# Patient Record
Sex: Female | Born: 1990 | Race: White | Hispanic: No | Marital: Single | Smoking: Current every day smoker
Health system: Southern US, Community
[De-identification: ages and names within clinical notes are randomized; demographics above are authoritative.]

## PROBLEM LIST (undated history)

## (undated) DIAGNOSIS — F1910 Other psychoactive substance abuse, uncomplicated: Secondary | ICD-10-CM

## (undated) DIAGNOSIS — M75100 Unspecified rotator cuff tear or rupture of unspecified shoulder, not specified as traumatic: Secondary | ICD-10-CM

## (undated) DIAGNOSIS — N926 Irregular menstruation, unspecified: Secondary | ICD-10-CM

## (undated) DIAGNOSIS — B029 Zoster without complications: Secondary | ICD-10-CM

## (undated) DIAGNOSIS — F419 Anxiety disorder, unspecified: Secondary | ICD-10-CM

## (undated) HISTORY — PX: TONSILLECTOMY: SUR1361

## (undated) HISTORY — PX: POLYPECTOMY: SHX149

## (undated) HISTORY — PX: WISDOM TOOTH EXTRACTION: SHX21

## (undated) HISTORY — PX: APPENDECTOMY: SHX54

---
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
## 2011-03-09 ENCOUNTER — Emergency Department: Payer: Self-pay

## 2011-03-09 LAB — COMPREHENSIVE METABOLIC PANEL
Albumin: 4.4 g/dL (ref 3.8–5.6)
Alkaline Phosphatase: 74 U/L — ABNORMAL LOW (ref 82–169)
Anion Gap: 8 (ref 7–16)
BUN: 9 mg/dL (ref 7–18)
Bilirubin,Total: 0.3 mg/dL (ref 0.2–1.0)
Calcium, Total: 8.5 mg/dL — ABNORMAL LOW (ref 9.0–10.7)
Chloride: 106 mmol/L (ref 98–107)
Co2: 26 mmol/L (ref 21–32)
Creatinine: 0.61 mg/dL (ref 0.60–1.30)
EGFR (African American): >60
EGFR (Non-African Amer.): >60
Osmolality: 278 (ref 275–301)
SGOT(AST): 23 U/L (ref 0–26)
Total Protein: 8.2 g/dL (ref 6.4–8.6)

## 2011-03-09 LAB — CBC
HGB: 14.8 g/dL (ref 12.0–16.0)
MCH: 32.3 pg (ref 26.0–34.0)
MCHC: 33.8 g/dL (ref 32.0–36.0)
Platelet: 180 10*3/uL (ref 150–440)
RBC: 4.56 10*6/uL (ref 3.80–5.20)
RDW: 13.4 % (ref 11.5–14.5)

## 2011-03-09 LAB — DRUG SCREEN, URINE
Barbiturates, Ur Screen: NEGATIVE (ref ?–200)
Methadone, Ur Screen: NEGATIVE (ref ?–300)
Opiate, Ur Screen: NEGATIVE (ref ?–300)
Phencyclidine (PCP) Ur S: NEGATIVE (ref ?–25)
Tricyclic, Ur Screen: NEGATIVE (ref ?–1000)

## 2011-03-09 LAB — ETHANOL
Ethanol %: 0.145 % — ABNORMAL HIGH (ref 0.000–0.080)
Ethanol: 145 mg/dL

## 2011-03-09 LAB — ACETAMINOPHEN LEVEL: Acetaminophen: <2 ug/mL

## 2011-03-09 LAB — TSH: Thyroid Stimulating Horm: 1.79 u[IU]/mL

## 2011-03-10 LAB — URINALYSIS, COMPLETE
Bacteria: NONE SEEN
Blood: NEGATIVE
Glucose,UR: NEGATIVE mg/dL (ref 0–75)
Ketone: NEGATIVE
Nitrite: NEGATIVE
Ph: 5 (ref 4.5–8.0)
Protein: NEGATIVE
RBC,UR: NONE SEEN /HPF (ref 0–5)
Specific Gravity: 1.004 (ref 1.003–1.030)
WBC UR: <1 /HPF (ref 0–5)

## 2011-03-10 LAB — PREGNANCY, URINE: Pregnancy Test, Urine: NEGATIVE m[IU]/mL

## 2011-07-14 ENCOUNTER — Emergency Department: Payer: Self-pay | Admitting: Emergency Medicine

## 2011-11-01 LAB — COMPREHENSIVE METABOLIC PANEL
Albumin: 4.5 g/dL (ref 3.4–5.0)
Alkaline Phosphatase: 76 U/L (ref 50–136)
Anion Gap: 9 (ref 7–16)
BUN: 12 mg/dL (ref 7–18)
Calcium, Total: 8.7 mg/dL (ref 8.5–10.1)
EGFR (African American): >60
Glucose: 86 mg/dL (ref 65–99)
Potassium: 3.8 mmol/L (ref 3.5–5.1)
SGOT(AST): 15 U/L (ref 15–37)
Sodium: 145 mmol/L (ref 136–145)
Total Protein: 8.4 g/dL — ABNORMAL HIGH (ref 6.4–8.2)

## 2011-11-01 LAB — URINALYSIS, COMPLETE
Blood: NEGATIVE
Glucose,UR: NEGATIVE mg/dL (ref 0–75)
Ketone: NEGATIVE
Leukocyte Esterase: NEGATIVE
Nitrite: NEGATIVE
Ph: 6 (ref 4.5–8.0)
Protein: NEGATIVE
RBC,UR: <1 /HPF (ref 0–5)
Specific Gravity: 1.004 (ref 1.003–1.030)

## 2011-11-01 LAB — DRUG SCREEN, URINE
Barbiturates, Ur Screen: NEGATIVE (ref ?–200)
Cannabinoid 50 Ng, Ur ~~LOC~~: POSITIVE (ref ?–50)
Cocaine Metabolite,Ur ~~LOC~~: NEGATIVE (ref ?–300)
Opiate, Ur Screen: NEGATIVE (ref ?–300)
Phencyclidine (PCP) Ur S: NEGATIVE (ref ?–25)
Tricyclic, Ur Screen: NEGATIVE (ref ?–1000)

## 2011-11-01 LAB — CBC
HGB: 14.3 g/dL (ref 12.0–16.0)
MCHC: 34.1 g/dL (ref 32.0–36.0)
RBC: 4.43 10*6/uL (ref 3.80–5.20)
RDW: 12.8 % (ref 11.5–14.5)
WBC: 7.6 10*3/uL (ref 3.6–11.0)

## 2011-11-01 LAB — PREGNANCY, URINE: Pregnancy Test, Urine: NEGATIVE m[IU]/mL

## 2011-11-01 LAB — ACETAMINOPHEN LEVEL: Acetaminophen: <2 ug/mL

## 2011-11-01 LAB — ETHANOL: Ethanol: 67 mg/dL

## 2011-11-01 LAB — TSH: Thyroid Stimulating Horm: 1.3 u[IU]/mL

## 2011-11-02 ENCOUNTER — Inpatient Hospital Stay: Payer: Self-pay | Admitting: Psychiatry

## 2011-11-02 LAB — TROPONIN I: Troponin-I: <0.02 ng/mL

## 2012-04-09 ENCOUNTER — Emergency Department: Payer: Self-pay | Admitting: Emergency Medicine

## 2012-08-30 ENCOUNTER — Emergency Department: Payer: Self-pay

## 2012-08-30 LAB — URINALYSIS, COMPLETE
Bacteria: NONE SEEN
Glucose,UR: NEGATIVE mg/dL (ref 0–75)
Ketone: NEGATIVE
Leukocyte Esterase: NEGATIVE
Nitrite: NEGATIVE
Ph: 5 (ref 4.5–8.0)
Protein: NEGATIVE
RBC,UR: <1 /HPF (ref 0–5)
Squamous Epithelial: 1
WBC UR: 1 /HPF (ref 0–5)

## 2012-08-30 LAB — WET PREP, GENITAL

## 2012-08-30 LAB — CBC
HGB: 12.8 g/dL (ref 12.0–16.0)
MCH: 30.9 pg (ref 26.0–34.0)
MCHC: 34.2 g/dL (ref 32.0–36.0)
MCV: 90 fL (ref 80–100)
RBC: 4.13 10*6/uL (ref 3.80–5.20)
RDW: 13.5 % (ref 11.5–14.5)

## 2012-08-30 LAB — GC/CHLAMYDIA PROBE AMP

## 2013-02-16 ENCOUNTER — Emergency Department: Payer: Self-pay | Admitting: Emergency Medicine

## 2013-04-07 ENCOUNTER — Emergency Department: Payer: Self-pay | Admitting: Emergency Medicine

## 2013-07-14 ENCOUNTER — Emergency Department: Payer: Self-pay | Admitting: Emergency Medicine

## 2013-07-14 LAB — CBC
HCT: 41.8 % (ref 35.0–47.0)
HGB: 13.8 g/dL (ref 12.0–16.0)
MCH: 30 pg (ref 26.0–34.0)
MCHC: 33.1 g/dL (ref 32.0–36.0)
MCV: 91 fL (ref 80–100)
PLATELETS: 234 10*3/uL (ref 150–440)
RBC: 4.6 10*6/uL (ref 3.80–5.20)
RDW: 15.5 % — ABNORMAL HIGH (ref 11.5–14.5)
WBC: 10.9 10*3/uL (ref 3.6–11.0)

## 2013-07-14 LAB — GC/CHLAMYDIA PROBE AMP

## 2013-07-14 LAB — WET PREP, GENITAL

## 2013-07-14 LAB — TSH: THYROID STIMULATING HORM: 1.36 u[IU]/mL

## 2013-10-14 ENCOUNTER — Emergency Department: Payer: Self-pay | Admitting: Internal Medicine

## 2014-01-30 ENCOUNTER — Encounter (HOSPITAL_COMMUNITY): Payer: Self-pay | Admitting: Emergency Medicine

## 2014-01-30 ENCOUNTER — Emergency Department (HOSPITAL_COMMUNITY)
Admission: EM | Admit: 2014-01-30 | Discharge: 2014-01-30 | Disposition: A | Discharge status: Other Acute Inpatient Hospital | Payer: Self-pay | Attending: Emergency Medicine | Admitting: Emergency Medicine

## 2014-01-30 ENCOUNTER — Inpatient Hospital Stay (HOSPITAL_COMMUNITY)
Admission: EM | Admit: 2014-01-30 | Discharge: 2014-02-05 | DRG: 881 | Disposition: A | Discharge status: Rehabilitation Facility | Payer: Federal, State, Local not specified - Other | Source: Intra-hospital | Attending: Psychiatry | Admitting: Psychiatry

## 2014-01-30 DIAGNOSIS — F1020 Alcohol dependence, uncomplicated: Secondary | ICD-10-CM | POA: Diagnosis present

## 2014-01-30 DIAGNOSIS — F329 Major depressive disorder, single episode, unspecified: Secondary | ICD-10-CM | POA: Diagnosis present

## 2014-01-30 DIAGNOSIS — G4700 Insomnia, unspecified: Secondary | ICD-10-CM | POA: Diagnosis present

## 2014-01-30 DIAGNOSIS — F17210 Nicotine dependence, cigarettes, uncomplicated: Secondary | ICD-10-CM | POA: Diagnosis present

## 2014-01-30 DIAGNOSIS — F1010 Alcohol abuse, uncomplicated: Secondary | ICD-10-CM | POA: Insufficient documentation

## 2014-01-30 DIAGNOSIS — Z23 Encounter for immunization: Secondary | ICD-10-CM | POA: Diagnosis not present

## 2014-01-30 DIAGNOSIS — F1120 Opioid dependence, uncomplicated: Secondary | ICD-10-CM | POA: Diagnosis present

## 2014-01-30 DIAGNOSIS — Z811 Family history of alcohol abuse and dependence: Secondary | ICD-10-CM | POA: Diagnosis not present

## 2014-01-30 DIAGNOSIS — F419 Anxiety disorder, unspecified: Secondary | ICD-10-CM | POA: Diagnosis present

## 2014-01-30 DIAGNOSIS — F1910 Other psychoactive substance abuse, uncomplicated: Secondary | ICD-10-CM

## 2014-01-30 DIAGNOSIS — F1410 Cocaine abuse, uncomplicated: Secondary | ICD-10-CM | POA: Diagnosis present

## 2014-01-30 DIAGNOSIS — F1210 Cannabis abuse, uncomplicated: Secondary | ICD-10-CM | POA: Insufficient documentation

## 2014-01-30 DIAGNOSIS — Z3202 Encounter for pregnancy test, result negative: Secondary | ICD-10-CM | POA: Insufficient documentation

## 2014-01-30 DIAGNOSIS — Z720 Tobacco use: Secondary | ICD-10-CM | POA: Insufficient documentation

## 2014-01-30 DIAGNOSIS — Z8739 Personal history of other diseases of the musculoskeletal system and connective tissue: Secondary | ICD-10-CM | POA: Insufficient documentation

## 2014-01-30 HISTORY — DX: Unspecified rotator cuff tear or rupture of unspecified shoulder, not specified as traumatic: M75.100

## 2014-01-30 HISTORY — DX: Zoster without complications: B02.9

## 2014-01-30 LAB — CBC WITH DIFFERENTIAL/PLATELET
BASOS ABS: 0 10*3/uL (ref 0.0–0.1)
BASOS PCT: 0 % (ref 0–1)
EOS ABS: 0.1 10*3/uL (ref 0.0–0.7)
EOS PCT: 1 % (ref 0–5)
HCT: 42.7 % (ref 36.0–46.0)
Hemoglobin: 13.4 g/dL (ref 12.0–15.0)
LYMPHS ABS: 1.8 10*3/uL (ref 0.7–4.0)
LYMPHS PCT: 23 % (ref 12–46)
MCH: 29.9 pg (ref 26.0–34.0)
MCHC: 31.4 g/dL (ref 30.0–36.0)
MCV: 95.3 fL (ref 78.0–100.0)
MONO ABS: 0.7 10*3/uL (ref 0.1–1.0)
MONOS PCT: 9 % (ref 3–12)
NEUTROS ABS: 5.1 10*3/uL (ref 1.7–7.7)
NEUTROS PCT: 67 % (ref 43–77)
Platelets: 219 10*3/uL (ref 150–400)
RBC: 4.48 MIL/uL (ref 3.87–5.11)
RDW: 15.9 % — AB (ref 11.5–15.5)
WBC: 7.8 10*3/uL (ref 4.0–10.5)

## 2014-01-30 LAB — RAPID URINE DRUG SCREEN, HOSP PERFORMED
Amphetamines: NOT DETECTED
Barbiturates: NOT DETECTED
Benzodiazepines: NOT DETECTED
COCAINE: POSITIVE — AB
Opiates: NOT DETECTED
TETRAHYDROCANNABINOL: POSITIVE — AB

## 2014-01-30 LAB — COMPREHENSIVE METABOLIC PANEL
ALBUMIN: 4 g/dL (ref 3.5–5.2)
ALT: 21 U/L (ref 0–35)
AST: 19 U/L (ref 0–37)
Alkaline Phosphatase: 56 U/L (ref 39–117)
Anion gap: 11 (ref 5–15)
BUN: 10 mg/dL (ref 6–23)
CO2: 24 mmol/L (ref 19–32)
Calcium: 9.5 mg/dL (ref 8.4–10.5)
Chloride: 104 mEq/L (ref 96–112)
Creatinine, Ser: 0.61 mg/dL (ref 0.50–1.10)
GFR calc Af Amer: >90 mL/min (ref >90)
GLUCOSE: 104 mg/dL — AB (ref 70–99)
Potassium: 3.7 mmol/L (ref 3.5–5.1)
Sodium: 139 mmol/L (ref 135–145)
Total Bilirubin: 0.7 mg/dL (ref 0.3–1.2)
Total Protein: 7.2 g/dL (ref 6.0–8.3)

## 2014-01-30 LAB — URINE MICROSCOPIC-ADD ON

## 2014-01-30 LAB — URINALYSIS, ROUTINE W REFLEX MICROSCOPIC
Bilirubin Urine: NEGATIVE
Glucose, UA: NEGATIVE mg/dL
HGB URINE DIPSTICK: NEGATIVE
Ketones, ur: NEGATIVE mg/dL
NITRITE: NEGATIVE
PROTEIN: NEGATIVE mg/dL
Specific Gravity, Urine: 1.021 (ref 1.005–1.030)
Urobilinogen, UA: 1 mg/dL (ref 0.0–1.0)
pH: 5.5 (ref 5.0–8.0)

## 2014-01-30 LAB — ETHANOL: Alcohol, Ethyl (B): <5 mg/dL (ref 0–9)

## 2014-01-30 LAB — POC URINE PREG, ED: Preg Test, Ur: NEGATIVE

## 2014-01-30 MED ORDER — NICOTINE 21 MG/24HR TD PT24: 21.0000 mg | MEDICATED_PATCH | Freq: Every day | TRANSDERMAL | Status: DC

## 2014-01-30 MED ORDER — IBUPROFEN 200 MG PO TABS: 600.0000 mg | ORAL_TABLET | Freq: Three times a day (TID) | ORAL | Status: DC | PRN

## 2014-01-30 MED ORDER — ONDANSETRON HCL 4 MG PO TABS: 4.0000 mg | ORAL_TABLET | Freq: Three times a day (TID) | ORAL | Status: DC | PRN

## 2014-01-30 NOTE — BHH Counselor (Signed)
TTS Counselor attempted to reach pt's attending nurse or physician in order to gain any collateral information and inform that she was coming to complete Behavioral health assessment. Pt was unable to reach either by phone, so TTS counselor went and began assessment in order to prevent any longer wait for the patient, as pt had been called in for consult at 16:08 and not seen until next (current) shift beginning at 19:00.    Cyndie MullAnna Zai Chmiel, [facility] Triage Specialist

## 2014-01-30 NOTE — ED Provider Notes (Signed)
CSN: [id number]     Arrival date & time 01/30/14  1530 History   First MD Initiated Contact with Patient 01/30/14 1539     Chief Complaint  Patient presents with  . Medical Clearance  . IVC      (Consider location/radiation/quality/duration/timing/severity/associated sxs/prior Treatment) HPI Kristen Schneider is a 24 y.o. female with no medical problems, presents to ED with [employer], under IVC. Pt's IVC taken out by her father who is concerned about her safety. Patient reports using crack cocaine, heroine, alcohol daily. She states that she was diagnosed with depression in the past, she has seen psychiatrists in the past and was on medications, but currently does not have any insurance and unable to see anyone. She states she realizes she needs help with her drug addiction and with her depression, and she wants to be here. She denies any suicidal homicidal ideations at this time, however she states when she is frustrated she does cut and burned her skin. She denies any recent injuries. She denies any other medical problems.  Past Medical History  Diagnosis Date  . Torn rotator cuff left shoulder   History reviewed. No pertinent past surgical history. No family history on file. History  Substance Use Topics  . Smoking status: Current Every Day Smoker  . Smokeless tobacco: Not on file  . Alcohol Use: Yes   OB History    No data available     Review of Systems  Constitutional: Negative for fever and chills.  Respiratory: Negative for cough, chest tightness and shortness of breath.   Cardiovascular: Negative for chest pain, palpitations and leg swelling.  Gastrointestinal: Negative for nausea, vomiting, abdominal pain and diarrhea.  Genitourinary: Negative for dysuria, flank pain and pelvic pain.  Musculoskeletal: Negative for myalgias, arthralgias, neck pain and neck stiffness.  Skin: Negative for rash.  Neurological: Negative for dizziness, weakness and headaches.  All other systems  reviewed and are negative.     Allergies  Review of patient's allergies indicates no known allergies.  Home Medications   Prior to Admission medications   Not on File   BP 129/80 mmHg  Pulse 67  Temp(Src) 98.8 F (37.1 C) (Oral)  Resp 16  SpO2 100%  LMP 01/25/2014 (Exact Date) Physical Exam  Constitutional: She is oriented to person, place, and time. She appears well-developed and well-nourished. No distress.  HENT:  Head: Normocephalic.  Eyes: Conjunctivae are normal.  Neck: Neck supple.  Cardiovascular: Normal rate, regular rhythm and normal heart sounds.   Pulmonary/Chest: Effort normal and breath sounds normal. No respiratory distress. She has no wheezes. She has no rales.  Abdominal: Soft. Bowel sounds are normal. She exhibits no distension. There is no tenderness. There is no rebound.  Musculoskeletal: She exhibits no edema.  Neurological: She is alert and oriented to person, place, and time.  Skin: Skin is warm and dry.  Psychiatric: She has a normal mood and affect. Her behavior is normal.  Nursing note and vitals reviewed.   ED Course  Procedures (including critical care time) Labs Review Labs Reviewed  CBC WITH DIFFERENTIAL - Abnormal; Notable for the following:    RDW 15.9 (*)    All other components within normal limits  COMPREHENSIVE METABOLIC PANEL - Abnormal; Notable for the following:    Glucose, Bld 104 (*)    All other components within normal limits  URINALYSIS, ROUTINE W REFLEX MICROSCOPIC - Abnormal; Notable for the following:    APPearance CLOUDY (*)    Leukocytes, UA  SMALL (*)    All other components within normal limits  URINE RAPID DRUG SCREEN (HOSP PERFORMED) - Abnormal; Notable for the following:    Cocaine POSITIVE (*)    Tetrahydrocannabinol POSITIVE (*)    All other components within normal limits  URINE MICROSCOPIC-ADD ON - Abnormal; Notable for the following:    Squamous Epithelial / LPF MANY (*)    Crystals CA OXALATE CRYSTALS  (*)    All other components within normal limits  ETHANOL  POC URINE PREG, ED    Imaging Review No results found.   EKG Interpretation None      MDM   Final diagnoses:  Polysubstance abuse  Alcohol abuse    Pt IVCed, here for alcohol, drug abuse, possibly harmful to self based on her fathers observations. She is calm, cooperative here. No complaints. Will get med clearance labs, TTS assessment.     Pt is medically cleared. Resting comfortably.  Filed Vitals:   01/30/14 1540 01/30/14 1809  BP: 129/80 120/72  Pulse: 67 55  Temp: 98.8 F (37.1 C)   TempSrc: Oral   Resp: 16 20  SpO2: 100% 100%     Lottie Musselatyana A Cayetano Mikita, PA-C 01/30/14 2350  Audree CamelScott T Goldston, MD 02/03/14 1531

## 2014-01-30 NOTE — Tx Team (Signed)
Initial Interdisciplinary Treatment Plan   PATIENT STRESSORS: Financial difficulties Legal issue Marital or family conflict Medication change or noncompliance Substance abuse Traumatic event   PATIENT STRENGTHS: Ability for insight Active sense of humor Capable of independent living Communication skills General fund of knowledge Motivation for treatment/growth Supportive family/friends   PROBLEM LIST: Problem List/Patient Goals Date to be addressed Date deferred Reason deferred Estimated date of resolution  I want help staying clean" 01/30/2014     "I want to get back on medications" 01/30/2014     Grief (death of her mother in 2007) 01/30/2014     Self harming behaviors 01/30/2014     Substance abuse. 01/30/2014                              DISCHARGE CRITERIA:  Ability to meet basic life and health needs Adequate post-discharge living arrangements Improved stabilization in mood, thinking, and/or behavior Motivation to continue treatment in a less acute level of care Need for constant or close observation no longer present Withdrawal symptoms are absent or subacute and managed without 24-hour nursing intervention  PRELIMINARY DISCHARGE PLAN: Attend aftercare/continuing care group Attend 12-step recovery group Outpatient therapy  PATIENT/FAMIILY INVOLVEMENT: This treatment plan has been presented to and reviewed with the patient, Kristen Schneider.  The patient and family have been given the opportunity to ask questions and make suggestions.  Angeline SlimHill, Ashley M 01/30/2014, 11:34 PM

## 2014-01-30 NOTE — BHH Counselor (Signed)
Per Donell SievertSpencer Simon, PA, pt meets inpatient criteria and [facility] Dr. Jama Flavorsobos.  TTS Counsel[facility] and ensured that everything was completed in [facility].  TTS Counselor informed nurse [name]([name], RN) and attending physician (Dr. [name]) of disposition as well.   [name], [name] Triage Specialist

## 2014-01-30 NOTE — ED Notes (Signed)
Bed: [facility] Expected date:  Expected time:  Means of arrival:  Comments: 

## 2014-01-30 NOTE — BH Assessment (Signed)
Attempted to reach TTS staff at [facility] to inform of pt consult . Unable to reach TTS, left a message for Tom to have counselor call BHH assessment office staff.   Iann Rodier, MS, LCASA Assessment Counselor  

## 2014-01-30 NOTE — ED Notes (Signed)
TTS at bedside. 
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
## 2014-01-30 NOTE — ED Notes (Signed)
Pt BIB [employer] IVC'd.  IVC papers state: "Respondent is a persistent abuser of crack cocaine and heroin.  She imbibes alcoholic beverages on a daily basis.  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  When upset with herself or others, the respondent will respond by cutting herself.  Respondent appears to be a danger to herself."  Pt denies SI/HI.  States she uses crack, cocaine, heroin, and etoh.  Last etoh was 12/31.  Pt calm and cooperative.

## 2014-01-31 ENCOUNTER — Encounter (HOSPITAL_COMMUNITY): Payer: Self-pay | Admitting: Behavioral Health

## 2014-01-31 DIAGNOSIS — F1994 Other psychoactive substance use, unspecified with psychoactive substance-induced mood disorder: Secondary | ICD-10-CM

## 2014-01-31 LAB — URINALYSIS W MICROSCOPIC (NOT AT ARMC)
BILIRUBIN URINE: NEGATIVE
GLUCOSE, UA: NEGATIVE mg/dL
Hgb urine dipstick: NEGATIVE
Ketones, ur: NEGATIVE mg/dL
Nitrite: NEGATIVE
PH: 6.5 (ref 5.0–8.0)
Protein, ur: NEGATIVE mg/dL
Specific Gravity, Urine: 1.02 (ref 1.005–1.030)
Urobilinogen, UA: 2 mg/dL — ABNORMAL HIGH (ref 0.0–1.0)

## 2014-01-31 LAB — TSH: TSH: 1.207 u[IU]/mL (ref 0.350–4.500)

## 2014-01-31 MED ORDER — ACETAMINOPHEN 325 MG PO TABS
650.0000 mg | ORAL_TABLET | Freq: Four times a day (QID) | ORAL | Status: DC | PRN
  Administered 2014-02-03: 650 mg via ORAL
  Filled 2014-01-31: qty 2

## 2014-01-31 MED ORDER — TRAZODONE HCL 50 MG PO TABS
50.0000 mg | ORAL_TABLET | Freq: Every evening | ORAL | Status: DC | PRN
  Administered 2014-01-31 – 2014-02-04 (×5): 50 mg via ORAL
  Filled 2014-01-31: qty 28
  Filled 2014-01-31 (×6): qty 1
  Filled 2014-01-31: qty 28
  Filled 2014-01-31 (×3): qty 1
  Filled 2014-01-31: qty 28
  Filled 2014-01-31: qty 1
  Filled 2014-01-31: qty 28
  Filled 2014-01-31: qty 1

## 2014-01-31 MED ORDER — INFLUENZA VAC SPLIT QUAD 0.5 ML IM SUSY
0.5000 mL | PREFILLED_SYRINGE | INTRAMUSCULAR | Status: AC
  Administered 2014-02-01: 0.5 mL via INTRAMUSCULAR
  Filled 2014-01-31: qty 0.5

## 2014-01-31 MED ORDER — PAROXETINE HCL 10 MG PO TABS
10.0000 mg | ORAL_TABLET | Freq: Every day | ORAL | Status: DC
  Administered 2014-01-31 – 2014-02-04 (×5): 10 mg via ORAL
  Filled 2014-01-31 (×3): qty 1
  Filled 2014-01-31: qty 14
  Filled 2014-01-31: qty 1
  Filled 2014-01-31: qty 14
  Filled 2014-01-31: qty 1

## 2014-01-31 MED ORDER — NICOTINE 21 MG/24HR TD PT24
21.0000 mg | MEDICATED_PATCH | Freq: Every day | TRANSDERMAL | Status: DC
  Administered 2014-01-31 – 2014-02-04 (×5): 21 mg via TRANSDERMAL
  Filled 2014-01-31 (×9): qty 1

## 2014-01-31 MED ORDER — HYDROXYZINE HCL 25 MG PO TABS
25.0000 mg | ORAL_TABLET | Freq: Four times a day (QID) | ORAL | Status: DC | PRN
Start: 2014-01-31 — End: 2014-02-04
  Administered 2014-01-31 – 2014-02-04 (×8): 25 mg via ORAL
  Filled 2014-01-31 (×8): qty 1

## 2014-01-31 MED ORDER — GABAPENTIN 100 MG PO CAPS
100.0000 mg | ORAL_CAPSULE | Freq: Three times a day (TID) | ORAL | Status: DC
  Administered 2014-01-31: 100 mg via ORAL
  Filled 2014-01-31 (×5): qty 1

## 2014-01-31 MED ORDER — GABAPENTIN 100 MG PO CAPS
100.0000 mg | ORAL_CAPSULE | Freq: Three times a day (TID) | ORAL | Status: DC
  Administered 2014-02-01 (×2): 100 mg via ORAL
  Filled 2014-01-31 (×8): qty 1

## 2014-01-31 MED ORDER — ALUM & MAG HYDROXIDE-SIMETH 200-200-20 MG/5ML PO SUSP: 30.0000 mL | ORAL | Status: DC | PRN

## 2014-01-31 MED ORDER — MAGNESIUM HYDROXIDE 400 MG/5ML PO SUSP: 30.0000 mL | Freq: Every day | ORAL | Status: DC | PRN
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
## 2014-01-31 NOTE — Progress Notes (Signed)
[facility] Group Notes:  (Nursing/MHT/Case Management/Adjunct)  Date:  01/31/2014  Time:  0915   Type of Therapy:  Psychoeducational Skills  Participation Level:  Active  Participation Quality:  Appropriate  Affect:  Flat  Cognitive:  Alert  Insight:  Good  Engagement in Group:  Engaged  Modes of Intervention:  Discussion, Education, Exploration and Support  Summary of Progress/Problems: Pt able to identify current feelings. Pt also identified one leisure activity and one healthy lifestyle change to make once discharged.   Aurora Maskwyman, Denyse Fillion E 01/31/2014, 10:58 AM

## 2014-01-31 NOTE — Plan of Care (Signed)
Problem: Ineffective individual coping Goal: STG: Patient will remain free from self harm Outcome: Progressing Patient remains free from self harm. 15 minute checks continued per protocol for patient safety.   Problem: Alteration in mood & ability to function due to Goal: LTG-Pt reports reduction in suicidal thoughts (Patient reports reduction in suicidal thoughts and is able to verbalize a safety plan for whenever patient is feeling suicidal)  Outcome: Progressing Patient denies having any suicidal thoughts today.   Comments:  Alena BillsGuthrie, Kortlynn Poust A, RN
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
## 2014-01-31 NOTE — Progress Notes (Signed)
Pt attended the AA speaker meeting.  Caswell Corwinwen, Laporchia Nakajima C, NT 12:12 PM

## 2014-01-31 NOTE — Progress Notes (Signed)
Patient ID: Darral DashJessica K Schneider, female   DOB: [birth year], 24 y.o.   MRN: [id number] D: Client is visible on the unit has visitor the during visiting hours, walking the halls. Client reports day been "alright" notes the following goals "get clean, get myself together, back to where I need I use to be" client reports "been off medication for a few years, insurance ran out" "I started using drug to cope with the anxiety then when I didn't have the drugs I got more anxious" Client reports anxiety as "8" A: Writer introduced self to client, encouraged her to verbalize concerns. Staff will monitor q8415min for safety. R: Client is safe on the unit, attended karaoke.

## 2014-01-31 NOTE — Clinical Social Work Note (Signed)
[facility]  [facility] due to pending assault charges.  [facility]

## 2014-01-31 NOTE — Progress Notes (Signed)
D: Patient is alert and oriented. Pt's mood and affect is pleasant but sad and blunted. Pt's eye contact is fair. Pt is attending groups. Pt rates her depression 6/10 and hopelessness 4/10. Pt reports her goals are to "take my medicine, don't associated with bad people, get a job, and go back to school." Pt denies SI/HI and AVH at this time. Pt is requesting inpatient rehab for substance abuse. A: Urine specimen collect and sent to lab. Encouragement/Support provided to pt. Active listening by RN. Scheduled medications administered per providers orders (See MAR). 15 minute checks continued per protocol for patient safety.  R: Patient cooperative and receptive to nursing interventions.

## 2014-01-31 NOTE — [facility] Suicide Risk Assessment (Signed)
   Nursing information obtained from:  Patient Demographic factors:  Low socioeconomic status, Living alone, Unemployed Current Mental Status:  Self-harm behaviors Loss Factors:  Legal issues Historical Factors:  Family history of mental illness or substance abuse Risk Reduction Factors:  Religious beliefs about death, Sense of responsibility to family, Living with another person, especially a relative, Positive therapeutic relationship, Positive social support Total Time spent with patient: 45 minutes  CLINICAL FACTORS:  Alcohol , Cocaine dependencies , Depression  Psychiatric Specialty Exam: Physical Exam  ROS  Blood pressure 96/71, pulse 83, temperature 98.9 F (37.2 C), temperature source Oral, resp. rate 16, height 5' 5.5" (1.664 m), weight 145 lb (65.772 kg), last menstrual period 01/25/2014, SpO2 99 %.Body mass index is 23.75 kg/(m^2).  General Appearance: Fairly Groomed  Patent attorneyye Contact::  Good  Speech:  Normal Rate  Volume:  Normal  Mood:  Depressed and but improivng  Affect:  Constricted and but reactive  Thought Process:  Linear  Orientation:  Full (Time, Place, and Person)  Thought Content:  not suicidal , not homicidal , no psychotic symptoms at present   Suicidal Thoughts:  No- at this time denies any suicidal plan or intention and contracts for safety on the unit  Homicidal Thoughts:  No  Memory:  Recent and Remote grossly intact  Judgement:  Fair  Insight:  Present  Psychomotor Activity:  normal  Concentration:  Good  Recall:  Good  Fund of Knowledge:Good  Language: Good  Akathisia:  Negative  Handed:  Right  AIMS (if indicated):     Assets:  Communication Skills Desire for Improvement Physical Health Resilience  Sleep:  Number of Hours: 4.5   Musculoskeletal: Strength & Muscle Tone: within normal limits Gait & Station: normal Patient leans: N/A  COGNITIVE FEATURES THAT CONTRIBUTE TO RISK:  Closed-mindedness    SUICIDE RISK:   Moderate:  Frequent  suicidal ideation with limited intensity, and duration, some specificity in terms of plans, no associated intent, good self-control, limited dysphoria/symptomatology, some risk factors present, and identifiable protective factors, including available and accessible social support.  PLAN OF CARE: Patient will be admitted to inpatient psychiatric unit for stabilization and safety. Will provide and encourage milieu participation. Provide medication management and maked adjustments as needed.  Will follow daily.    I certify that inpatient services furnished can reasonably be expected to improve the patient's condition.  COBOS, FERNANDO 01/31/2014, 4:40 PM
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
## 2014-01-31 NOTE — B[facility] Group Notes (Signed)
[facility] LCSW Group Therapy  [facility] 1:15 - 2:30 PM  01/31/2014 3:18 PM   Type of Therapy:  Group Therapy  Participation Level: Minimal  Participation Quality:  Attentive  Affect:  Appropriate  Cognitive:  Appropriate  Insight:  Developing/Improving   Engagement in Therapy:  Developing/Improving   Modes of Intervention:  Discussion, Education, Exploration, Problem-Solving, Rapport Building, Support   Summary of Progress/Problems:   Patient was attentive to speaker from the [facility] as he shared his story of dealing with mental health/substance abuse issues and overcoming it by working a recovery program.  Patient made no comment on the presentation.  Kristen Schneider, Kristen Schneider 01/31/2014 3:18 PM

## 2014-01-31 NOTE — Progress Notes (Signed)
Recreation Therapy Notes  Animal-Assisted Activity/Therapy (AAA/T) Program Checklist/Progress Notes Patient Eligibility Criteria Checklist & Daily Group note for Rec Tx Intervention  Date: 01.07.2015 Time: 2:45pm [address]    AAA/T Program Assumption of Risk Form signed by Patient/ or Parent Legal Guardian yes  Patient is free of allergies or sever asthma yes  Patient reports no fear of animals yes  Patient reports no history of cruelty to animals yes  Patient understands his/her participation is voluntary yes  Patient washes hands before animal contact yes  Patient washes hands after animal contact yes  Behavioral Response: Engaged, Appropriate   Education: Charity fundraiserHand Washing, Appropriate Animal Interaction   Education Outcome: Acknowledges education.   Clinical Observations/Feedback: Patient pet therapy dog appropriately from floor level, initially becoming tearful as she interacted with therapy dog. Patient quickly self-corrected and continued to engage in session with euthymic to bright mood and affect. Patient asked appropriate questions about therapy dog and his training, shared stories about her pet at home and fed therapy dog a treat for completing basic obedience commands.    Kristen Schneider, Kristen Schneider  Kristen Schneider, Kristen Schneider 01/31/2014 4:29 PM

## 2014-02-01 DIAGNOSIS — F1924 Other psychoactive substance dependence with psychoactive substance-induced mood disorder: Secondary | ICD-10-CM

## 2014-02-01 LAB — URINE CULTURE
Colony Count: NO GROWTH
Culture: NO GROWTH

## 2014-02-01 LAB — HEPATITIS B SURFACE ANTIGEN: Hepatitis B Surface Ag: NEGATIVE

## 2014-02-01 LAB — HEPATITIS C ANTIBODY: HCV AB: NEGATIVE

## 2014-02-01 MED ORDER — GABAPENTIN 100 MG PO CAPS
200.0000 mg | ORAL_CAPSULE | Freq: Two times a day (BID) | ORAL | Status: DC
  Administered 2014-02-02: 200 mg via ORAL
  Filled 2014-02-01 (×5): qty 2

## 2014-02-01 NOTE — [facility] Group Notes (Signed)
[name]   02/01/2014 9:51 AM    Participation Quality:  Appropraite  Mood/Affect:  Depressed, Flat  Depression Rating:  7-8  Anxiety Rating:  9  Thoughts of Suicide:  No  Will you contract for safety?   NA  Current AVH:  No  Plan for Discharge/Comments:  Patient attended discharge planning group and actively participated in group. She is requesting to be referred for residential treatment.  Suicide prevention education reviewed and SPE document provided.   Transportation Means: Patient has transportation.   Supports:  Patient has a support system.   Kristen Schneider, Kristen Schneider

## 2014-02-01 NOTE — Progress Notes (Signed)
[facility] MD Progress Note  02/01/2014 2:08 PM Kristen Schneider  MRN:  [id number] Subjective:  Patient states she feels anxious, but remains very motivated in her recovery and ongoing treatment. She describes some anxiety, subjective restlessness. Objective:  I have discussed case with treatment team and have met with patient. Patient presents with some anxiety, which she states is chronic. It does not appear to be related to withdrawal, as she has no significant tremors, diaphoresis, or psychomotor restlessness and her vitals are stable. She is experiencing cravings for cocaine and alcohol, but remains motivated is wanting to go to an inpatient rehabilitation program after her discharge from unit. At this time tolerating medications well, denies any side effects. We reviewed side effects and she is aware of the potential for SSRI to increase activation or self injurious thoughts in young adults. No disruptive  Behaviors on the unit . Has been going to groups/ participative in milieu. Patient is future oriented, and discussed her dream of becoming an RN, which she states " can only happen if I am clean"  Of note, Hep B/C screening negative, other labs pending. UA suspicious for UTI  But patient not endorsing symptoms nor having any fever. Diagnosis:  Polysubstance Dependence, Substance Induced Mood Disorder  Total Time spent with patient: 20 minutes    ADL's:  improving  Sleep: improved   Appetite:good   Suicidal Ideation:  Denies any suicidal ideations Homicidal Ideation:  Denies any homicidal ideations AEB (as evidenced by):  Psychiatric Specialty Exam: Physical Exam  ROS  Blood pressure 111/62, pulse 79, temperature 98.2 F (36.8 C), temperature source Oral, resp. rate 16, height 5' 5.5" (1.664 m), weight 145 lb (65.772 kg), last menstrual period 01/25/2014, SpO2 99 %.Body mass index is 23.75 kg/(m^2).  General Appearance: improved grooming  Eye Contact::  Good  Speech:  Normal  Rate  Volume:  Normal  Mood:  less depressed, but still constricted and anxious in affect  Affect:  anxious but reactive  Thought Process:  Goal Directed and Linear  Orientation:  Other:  fully alert and attentive  Thought Content:  denies hallucinations,no delusions and does not appear internally preoccupied   Suicidal Thoughts:  No- denies any self injurious or suicidal ideations at present   Homicidal Thoughts:  No  Memory:  Recent and Remote grossly intact  Judgement:  Fair  Insight:  Present  Psychomotor Activity:  Normal  Concentration:  Good  Recall:  Good  Fund of Knowledge:Good  Language: Good  Akathisia:  Negative  Handed:  Right  AIMS (if indicated):     Assets:  Desire for Improvement Physical Health Resilience  Sleep:  Number of Hours: 6.5   Musculoskeletal: Strength & Muscle Tone: within normal limits- at this time not presenting with any significant tremors or diaphoresis Gait & Station: normal Patient leans: N/A  Current Medications: Current Facility-Administered Medications  Medication Dose Route Frequency Provider Last Rate Last Dose  . acetaminophen (TYLENOL) tablet 650 mg  650 mg Oral Q6H PRN Laverle Hobby, PA-C      . alum & mag hydroxide-simeth (MAALOX/MYLANTA) 200-200-20 MG/5ML suspension 30 mL  30 mL Oral Q4H PRN Laverle Hobby, PA-C      . gabapentin (NEURONTIN) capsule 100 mg  100 mg Oral TID Jenne Campus, MD   100 mg at 02/01/14 1159  . hydrOXYzine (ATARAX/VISTARIL) tablet 25 mg  25 mg Oral Q6H PRN Laverle Hobby, PA-C   25 mg at 02/01/14 8757  . magnesium hydroxide (  MILK OF MAGNESIA) suspension 30 mL  30 mL Oral Daily PRN Laverle Hobby, PA-C      . nicotine (NICODERM CQ - dosed in mg/24 hours) patch 21 mg  21 mg Transdermal Daily Jenne Campus, MD   21 mg at 02/01/14 0839  . PARoxetine (PAXIL) tablet 10 mg  10 mg Oral QHS Jenne Campus, MD   10 mg at 01/31/14 2207  . traZODone (DESYREL) tablet 50 mg  50 mg Oral QHS,MR X 1 Laverle Hobby, PA-C   50 mg at 01/31/14 2207    Lab Results:  Results for orders placed or performed during the hospital encounter of 01/30/14 (from the past 48 hour(s))  TSH     Status: None   Collection Time: 01/31/14  6:15 AM  Result Value Ref Range   TSH 1.207 0.350 - 4.500 uIU/mL    Comment: Performed at [facility]  Urinalysis with microscopic     Status: Abnormal   Collection Time: 01/31/14  5:15 PM  Result Value Ref Range   Color, Urine YELLOW YELLOW   APPearance CLOUDY (A) CLEAR   Specific Gravity, Urine 1.020 1.005 - 1.030   pH 6.5 5.0 - 8.0   Glucose, UA NEGATIVE NEGATIVE mg/dL   Hgb urine dipstick NEGATIVE NEGATIVE   Bilirubin Urine NEGATIVE NEGATIVE   Ketones, ur NEGATIVE NEGATIVE mg/dL   Protein, ur NEGATIVE NEGATIVE mg/dL   Urobilinogen, UA 2.0 (H) 0.0 - 1.0 mg/dL   Nitrite NEGATIVE NEGATIVE   Leukocytes, UA LARGE (A) NEGATIVE   WBC, UA 21-50 <3 WBC/hpf    Comment: WITH CLUMPS   Bacteria, UA FEW (A) RARE   Squamous Epithelial / LPF MANY (A) RARE    Comment: Performed at [facility]  Hepatitis B surface antigen     Status: None   Collection Time: 02/01/14  6:32 AM  Result Value Ref Range   Hepatitis B Surface Ag NEGATIVE NEGATIVE    Comment: Performed at [facility]  Hepatitis C antibody     Status: None   Collection Time: 02/01/14  6:32 AM  Result Value Ref Range   HCV Ab NEGATIVE NEGATIVE    Comment: Performed at [facility]    Physical Findings: AIMS: Facial and Oral Movements Muscles of Facial Expression: None, normal Lips and Perioral Area: None, normal Jaw: None, normal Tongue: None, normal,Extremity Movements Lower (legs, knees, ankles, toes): None, normal, Trunk Movements Neck, shoulders, hips: None, normal, Overall Severity Severity of abnormal movements (highest score from questions above): None, normal Incapacitation due to abnormal movements: None, normal, Dental Status Current problems with teeth  and/or dentures?: No Does patient usually wear dentures?: No  CIWA:  CIWA-Ar Total: 0 COWS:  COWS Total Score: 0   Assessment- Patient is motivated in her recovery efforts. She is hopeful of going to a Rehab upon discharge from our unit. She is not presenting with current withdrawal symptoms. She does have some chronic anxiety. As noted, she states that Neurontin and Paxil have been effective in the past  and she has been restarted on these meds .  Treatment Plan Summary: Daily contact with patient to assess and evaluate symptoms and progress in treatment Medication management See below  Plan: Continue inpatient treatment and support. Treatment team working on disposition planning - patient interested in going to Rehab after discharge Continue Paxil 10 mgrs QHS  Increase Neurontin to 200 mgrs BID  Labs pending, to include Urine Culture.  Medical  Decision Making Problem Points:  Established problem, stable/improving (1), Review of last therapy session (1) and Review of psycho-social stressors (1) Data Points:  Review or order clinical lab tests (1) Review of medication regiment & side effects (2)  I certify that inpatient services furnished can reasonably be expected to improve the patient's condition.   COBOS, FERNANDO 02/01/2014, 2:08 PM

## 2014-02-01 NOTE — Tx Team (Signed)
Interdisciplinary Treatment Plan Update   Date Reviewed:  02/01/2014  Time Reviewed:  8:51 AM  Progress in Treatment:   Attending groups: Yes Participating in groups: Yes Taking medication as prescribed: Yes  Tolerating medication: Yes Family/Significant other contact made:  Yes, collateral contact with grandmother Patient understands diagnosis: Yes  Discussing patient identified problems/goals with staff: Yes Medical problems stabilized or resolved: Yes Denies suicidal/homicidal ideation: Yes Patient has not harmed self or others: Yes  For review of initial/current patient goals, please see plan of care.  Estimated Length of Stay:  3-4 days  Reasons for Continued Hospitalization:  Anxiety Depression Medication stabilization   New Problems/Goals identified:    Discharge Plan or Barriers:   Ref[facility] to [facility].  Declined by [facility] for residential treatment due to having outstanding assault charges.  Additional Comments:  Kristen FuJessica Schneider is a 24 yo female patient who came in to get help with her substance abuse. She said she has been abusing various substances since she was 24 yrs old. She is currently using heroin, crack and cocaine. She also drink vodka and can consume daily. Her UDS was positive for cocaine and THC.   Kristen BumpsJessica is calm and fully able to articulate her problems with substance abuse. Furthermore, she verbalizes the desire to obtain detox help and get clean. She expressed frustration at not being able to have done much in her life since graduating from [employer] 5 years ago. She states that once she gets clean and completes a Rehab program, her grandparents will take her back so that she can get back up on her feet.  Kristen Schneider;s mother died unexpectedly in Dec 2--7. This has remained an emotional trigger for her aeb by her tearful sentiments about the passing of her mom. She also stated that she would trade sex for drugs.  Patient and CSW reviewed patient's  identified goals and treatment plan.  Patient verbalized understanding and agreed to treatment plan.   Attendees:  Patient:  02/01/2014 8:51 AM   Signature:  Sallyanne HaversF. Cobos, MD 02/01/2014 8:51 AM  Signature: Geoffery LyonsIrving Lugo, MD 02/01/2014 8:51 AM  Signature:  Harold Barbanonecia Byrd, RN 02/01/2014 8:51 AM  Signature:  Robbie LouisVivian Kent, RN 02/01/2014 8:51 AM  Signature:  Haze BoydenAngela Traxler, RN 02/01/2014 8:51 AM  Signature:  Juline PatchQuylle Miller [place], LCSW 02/01/2014 8:51 AM  Signature:  Belenda CruiseKristin Drinkard, LCSW-A 02/01/2014 8:51 AM  Signature:  Leisa LenzValerie Enoch, Care Coordinator [facility] 02/01/2014 8:51 AM  Signature:   02/01/2014 8:51 AM  Signature:  02/01/2014  8:51 AM  Signature:   Onnie BoerJennifer Clark, RN [facility] 02/01/2014  8:51 AM  Signature:   02/01/2014  8:51 AM    Scribe for Treatment Team:   Juline PatchQuylle Bassem Bernasconi,  02/01/2014 8:51 AM

## 2014-02-01 NOTE — [facility] Suicide Risk Assessment (Signed)
[facility] INPATIENT:  Family/Significant Other Suicide Prevention Education  Suicide Prevention Education:  Education Completed; Kristen Schneider, Grandmother; [phone]; has been identified by the patient as the family member/significant other with whom the patient will be residing, and identified as the person(s) who will aid the patient in the event of a mental health crisis (suicidal ideations/suicide attempt).  With written consent from the patient, the family member/significant other has been provided the following suicide prevention education, prior to the and/or following the discharge of the patient.  The suicide prevention education provided includes the following:  Suicide risk factors  Suicide prevention and interventions  National Suicide Hotline telephone number  [facility] assessment telephone number  [facility] Emergency Assistance 911  [facility] and/or Residential Mobile Crisis Unit telephone number  Request made of family/significant other to:  Remove weapons (e.g., guns, rifles, knives), all items previously/currently identified as safety concern.  Grandmother advised patient does not have access to weapons.    Remove drugs/medications (over-the-counter, prescriptions, illicit drugs), all items previously/currently identified as a safety concern.  The family member/significant other verbalizes understanding of the suicide prevention education information provided.  The family member/significant other agrees to remove the items of safety concern listed above.  Kristen Schneider, Kristen Schneider 02/01/2014, 3:35 PM

## 2014-02-01 NOTE — Plan of Care (Signed)
Problem: Ineffective individual coping Goal: STG: Patient will remain free from self harm Outcome: Progressing Client will remain free from harm AEB by [id number] safety checks and agreeing to contact staff if such feelings should occur.

## 2014-02-01 NOTE — Progress Notes (Signed)
Patient did attend the evening karaoke group.  

## 2014-02-01 NOTE — Progress Notes (Signed)
Pt has been up and active in the milieu today.  She rated her depression 6 hopelessness a 2 and her anxiety a 8-9 on her self-inventory.  She denied any S/H ideation or A/V/H.  Her goal today was to get her anxiety down by "trying to stay calm and breathe slowly I learned this in one of the groups here".  She is hoping to f/u [facility] if she is accepted on either Monday or Tuesday next week.

## 2014-02-01 NOTE — Clinical Social Work Note (Signed)
CSW spoke with patient's grandmother, Kristen Schneider.  She advised patient needs help and needs to be in a program.  She advised her husband will not allow patient to return to their home at discharge.  Grandmother advised we are trying to get patient in a program but cannot guarantee she will get in.  Grandmother informed if we are unable to get patient into a program, we would not be able to keep her in the hospital due to lack of housing.

## 2014-02-01 NOTE — [facility] Group Notes (Signed)
[facility] LCSW Group Therapy  Feelings Around Relapse 1:15 -2:30        02/01/2014   Type of Therapy:  Group Therapy  Participation Level:  Appropriate  Participation Quality:  Appropriate  Affect:  Appropriate  Cognitive:  Attentive Appropriate  Insight:  Developing/Improving  Engagement in Therapy: Developing/Improving  Modes of Intervention:  Discussion Exploration Problem-Solving Supportive  Summary of Progress/Problems:  The topic for today was feelings around relapse.    Patient processed feelings toward relapse and was able to relate to peers. She identified returning to drug use as the way she would relapsed.  Patient advises she abuses alcohol, heroin, crack and powder cocaine. Patient identified coping skills that can be used to prevent a relapse as getting into residential treatment and making new friends.   Wynn BankerHodnett, Ashvin Adelson Hairston 02/01/2014

## 2014-02-01 NOTE — Plan of Care (Signed)
Problem: Alteration in mood Goal: STG-Patient reports thoughts of self-harm to staff Outcome: Progressing Pt denied any suicidal ideation on her self-inventory and reports no plan here in the hospital and does verbally contract to come to staff before acting on any self-harm thoughts.

## 2014-02-01 NOTE — [facility] Group Notes (Signed)
Adult Psychoeducational Group Note  Date:  02/01/2014 Time:  11:56 PM  Group Topic/Focus:  AA Meeting  Participation Level:  Minimal  Participation Quality:  Attentive  Affect:  Flat  Cognitive:  Alert  Insight: Good  Engagement in Group:  Limited  Modes of Intervention:  Discussion and Education  Additional Comments:  [name] attended group.  Caroll RancherLindsay, Natanel Snavely A 02/01/2014, 11:56 PM

## 2014-02-02 LAB — HIV ANTIBODY (ROUTINE TESTING W REFLEX)
HIV 1/HIV 2 AB: NONREACTIVE
HIV 1/O/2 Abs-Index Value: <1 (ref <1.00)

## 2014-02-02 LAB — GC/CHLAMYDIA PROBE AMP
CT PROBE, AMP APTIMA: NEGATIVE
GC PROBE AMP APTIMA: NEGATIVE

## 2014-02-02 LAB — RPR: RPR Ser Ql: NONREACTIVE — AB

## 2014-02-02 MED ORDER — GABAPENTIN 300 MG PO CAPS
300.0000 mg | ORAL_CAPSULE | Freq: Two times a day (BID) | ORAL | Status: DC
  Administered 2014-02-02 – 2014-02-04 (×4): 300 mg via ORAL
  Filled 2014-02-02 (×6): qty 1

## 2014-02-02 NOTE — Progress Notes (Signed)
D) Pt requesting a 1:1 today. Began the session with "I have screwed up my whole life. I graduated from HS 5 years ago and have done absolutely nothing with my life except waste it. I haven't done a thing to make my family proud of me. I am a horrible person. All I do is use as many drugs as I can". States she has a lot of family support "they would do anything so I won't use". States "I just feel terrible about myself and what I have done. A) Provided Pt with a lengthy 1:1. She was able to describe how horrible she felt about herself and how she does not feel like a good person at all. After Pt explained her feelings, this writer was able to have Pt answer questions about herself without the bias of her feelings. Pt was able to describe herself as a good person who really likes to reach out and help others. States she would like to be able to be go to nursing school and help others.  Affect less intense after the 1:1. Was able to state she felt safe and wanted very much to work on her sobriety. Rates her depression at a 6, her hopelessness at a 1 and her anxiety at an 8. Denies SI and HI

## 2014-02-02 NOTE — Progress Notes (Signed)
.  Psychoeducational Group Note    Date: 02/02/2014 Time: 0930   Goal Setting Purpose of Group: To be able to set a goal that is measurable and that can be accomplished in one day Participation Level:  Active  Participation Quality:  Appropriate  Affect:  Appropriate  Cognitive:  Oriented  Insight:  Improving  Engagement in Group:  Engaged  Additional Comments:  Pt was engaged and involved in the group. Participation good.  Alfred Harrel A  

## 2014-02-02 NOTE — Progress Notes (Signed)
Psychoeducational Group Note  Date: 02/02/2014 Time:  1015  Group Topic/Focus:  Identifying Needs:   The focus of this group is to help patients identify their personal needs that have been historically problematic and identify healthy behaviors to address their needs.  Participation Level:  Active  Participation Quality:  Appropriate  Affect:  Appropriate  Cognitive:  Oriented  Insight:  Improving  Engagement in Group:  Engaged  Additional Comments:  Pt participated and was involved in the discussion.  Hortence Charter A 

## 2014-02-02 NOTE — Progress Notes (Signed)
D: Pt has anxious affect and depressed, anxious mood.  Pt reports she was very anxious and depressed earlier today.  She reported that after talking to the nurse earlier, she felt better.  Pt reports she had a good visit with her friend this evening.  She denies SI/HI, denies hallucinations.  Pt attended evening group and interacts appropriately with staff and peers.   A: Medications administered per order.  Safety maintained.  Met with pt 1:1 and provided support and encouragement. R: Pt is compliant with medications.  She verbally contracts for safety.  Will continue to monitor and assess for safety.

## 2014-02-02 NOTE — Progress Notes (Signed)
D: Pt has anxious affect and mood.  Pt reports her day has been "pretty good.  Better after seeing my niece."  Pt reports her goal today was "to get my anxiety and depression down but I almost had a panic attack earlier when we got on the elevator because I'm claustrophobic."  Pt reports she had a good visit with her sister, father, brother-in-law, and niece.  She denies SI/HI, denies hallucinations.  Pt attended evening group and interacts appropriately with staff and peers.   A: Medication administered per order.  Safety maintained.  Met with pt 1:1 and provided support and encouragement.  PRN medication administered for anxiety, see flowsheet.   R: Pt is compliant with medications.  She verbally contracted for safety.  Pt reported she will notify staff of needs and concerns.  Will continue to monitor and assess for safety.

## 2014-02-02 NOTE — Progress Notes (Addendum)
[facility] MD Progress Note  02/02/2014 4:17 PM Kristen Schneider  MRN:  [id number] Subjective:  Patient states she feels anxious, but remains very motivated in her recovery and ongoing treatment. She describes some anxiety and depressed, subjective restlessness. Objective:  I have met with patient and reviewed the chart. Patient presents with some anxiety, which she states is chronic. It does not appear to be related to withdrawal, as she has no significant tremors, diaphoresis, or psychomotor restlessness and her vitals are stable. She is experiencing cravings for cocaine and alcohol, but remains motivated is wanting to go to an inpatient rehabilitation program after her discharge from unit. At this time tolerating medications well, denies any side effects. We reviewed side effects and she is aware of the potential for SSRI to increase activation or self injurious thoughts in young adults. No disruptive  Behaviors on the unit . Has been going to groups/ participative in milieu. 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 UA was suspicious for UTI, but culture was neg.  But patient not endorsing symptoms nor having any fever. Diagnosis:  Polysubstance Dependence, Substance Induced Mood Disorder  Total Time spent with patient: 20 minutes    ADL's:  improving  Sleep: fair, but does not feel rested.  Appetite:good   Suicidal Ideation:  Denies any suicidal ideations Homicidal Ideation:  Denies any homicidal ideations AEB (as evidenced by):  Psychiatric Specialty Exam: Physical Exam  ROS  Blood pressure 122/80, pulse 86, temperature 97.4 F (36.3 C), temperature source Oral, resp. rate 20, height 5' 5.5" (1.664 m), weight 65.772 kg (145 lb), last menstrual period 01/25/2014, SpO2 99 %.Body mass index is 23.75 kg/(m^2).  General Appearance: improved grooming  Eye  Contact::  Good  Speech:  Normal Rate  Volume:  Normal  Mood:  less depressed, but still constricted and anxious in affect  Affect:  anxious but reactive  Thought Process:  Goal Directed and Linear  Orientation:  Other:  fully alert and attentive  Thought Content:  denies hallucinations,no delusions and does not appear internally preoccupied   Suicidal Thoughts:  No- denies any self injurious or suicidal ideations at present   Homicidal Thoughts:  No  Memory:  Recent and Remote grossly intact  Judgement:  Fair  Insight:  Present  Psychomotor Activity:  Normal  Concentration:  Good  Recall:  Good  Fund of Knowledge:Good  Language: Good  Akathisia:  Negative  Handed:  Right  AIMS (if indicated):     Assets:  Desire for Improvement Physical Health Resilience  Sleep:  Number of Hours: 6.5   Musculoskeletal: Strength & Muscle Tone: within normal limits- at this time not presenting with any significant tremors or diaphoresis Gait & Station: normal Patient leans: N/A  Current Medications: Current Facility-Administered Medications  Medication Dose Route Frequency Provider Last Rate Last Dose  . acetaminophen (TYLENOL) tablet 650 mg  650 mg Oral Q6H PRN Kerry Hough, PA-C      . alum & mag hydroxide-simeth (MAALOX/MYLANTA) 200-200-20 MG/5ML suspension 30 mL  30 mL Oral Q4H PRN Kerry Hough, PA-C      . gabapentin (NEURONTIN) capsule 200 mg  200 mg Oral BID Craige Cotta, MD   200 mg at 02/02/14 0837  . hydrOXYzine (ATARAX/VISTARIL) tablet 25 mg  25 mg Oral Q6H PRN Kerry Hough, PA-C   25 mg  at 02/02/14 1447  . magnesium hydroxide (MILK OF MAGNESIA) suspension 30 mL  30 mL Oral Daily PRN Kerry Hough, PA-C      . nicotine (NICODERM CQ - dosed in mg/24 hours) patch 21 mg  21 mg Transdermal Daily Craige Cotta, MD   21 mg at 02/02/14 0836  . PARoxetine (PAXIL) tablet 10 mg  10 mg Oral QHS Craige Cotta, MD   10 mg at 02/01/14 2120  . traZODone (DESYREL) tablet 50 mg   50 mg Oral QHS,MR X 1 Kerry Hough, PA-C   50 mg at 02/01/14 2120    Lab Results:  Results for orders placed or performed during the hospital encounter of 01/30/14 (from the past 48 hour(s))  GC/Chlamydia Probe Amp (multiple spec sources)     Status: None   Collection Time: 01/31/14  5:15 PM  Result Value Ref Range   CT Probe RNA NEGATIVE NEGATIVE   GC Probe RNA NEGATIVE NEGATIVE    Comment: (NOTE)                                                                                       **Normal Reference Range: Negative**      Assay performed using the Gen-Probe APTIMA COMBO2 (R) Assay. Acceptable specimen types for this assay include APTIMA Swabs (Unisex, endocervical, urethral, or vaginal), first void urine, and ThinPrep liquid based cytology samples. Performed at [facility]   Urinalysis with microscopic     Status: Abnormal   Collection Time: 01/31/14  5:15 PM  Result Value Ref Range   Color, Urine YELLOW YELLOW   APPearance CLOUDY (A) CLEAR   Specific Gravity, Urine 1.020 1.005 - 1.030   pH 6.5 5.0 - 8.0   Glucose, UA NEGATIVE NEGATIVE mg/dL   Hgb urine dipstick NEGATIVE NEGATIVE   Bilirubin Urine NEGATIVE NEGATIVE   Ketones, ur NEGATIVE NEGATIVE mg/dL   Protein, ur NEGATIVE NEGATIVE mg/dL   Urobilinogen, UA 2.0 (H) 0.0 - 1.0 mg/dL   Nitrite NEGATIVE NEGATIVE   Leukocytes, UA LARGE (A) NEGATIVE   WBC, UA 21-50 <3 WBC/hpf    Comment: WITH CLUMPS   Bacteria, UA FEW (A) RARE   Squamous Epithelial / LPF MANY (A) RARE    Comment: Performed at [facility]  Urine culture     Status: None   Collection Time: 01/31/14  5:15 PM  Result Value Ref Range   Specimen Description      URINE, RANDOM Performed at [facility]    Special Requests      NONE Performed at [facility]    Colony Count NO GROWTH Performed at [facility]     Culture NO GROWTH Performed at [facility]     Report Status  02/01/2014 FINAL   HIV antibody     Status: None   Collection Time: 02/01/14  6:32 AM  Result Value Ref Range   HIV 1/O/2 Abs-Index Value <1.00 <1.00    Comment: Index Value: Specimen reactivity relative to the negative cutoff.   HIV-1/HIV-2 Ab Non Reactive Non Reactive    Comment: (NOTE) Performed At:  [facility] [place], [place] [id number] Lindon Romp MD [id number] Performed at [facility]   RPR     Status: Abnormal   Collection Time: 02/01/14  6:32 AM  Result Value Ref Range   RPR Ser Ql Non Reactive (A) Non Reactive    Comment: (NOTE) Performed At: [facility] [place], [place] [id number] Lindon Romp MD [id number] Performed at [facility]   Hepatitis B surface antigen     Status: None   Collection Time: 02/01/14  6:32 AM  Result Value Ref Range   Hepatitis B Surface Ag NEGATIVE NEGATIVE    Comment: Performed at [facility]  Hepatitis C antibody     Status: None   Collection Time: 02/01/14  6:32 AM  Result Value Ref Range   HCV Ab NEGATIVE NEGATIVE    Comment: Performed at [facility]    Physical Findings: AIMS: Facial and Oral Movements Muscles of Facial Expression: None, normal Lips and Perioral Area: None, normal Jaw: None, normal Tongue: None, normal,Extremity Movements Upper (arms, wrists, hands, fingers): None, normal Lower (legs, knees, ankles, toes): None, normal, Trunk Movements Neck, shoulders, hips: None, normal, Overall Severity Severity of abnormal movements (highest score from questions above): None, normal Incapacitation due to abnormal movements: None, normal Patient's awareness of abnormal movements (rate only patient's report): No Awareness, Dental Status Current problems with teeth and/or dentures?: No Does patient usually wear dentures?: No  CIWA:  CIWA-Ar Total: 0 COWS:  COWS Total Score: 0   Assessment- Patient is motivated  in her recovery efforts. She is hopeful of going to a Rehab upon discharge from our unit. She is not presenting with current withdrawal symptoms. She does have some chronic anxiety. As noted, she states that Neurontin and Paxil have been effective in the past  and she has been restarted on these meds .  Treatment Plan Summary: Daily contact with patient to assess and evaluate symptoms and progress in treatment Medication management See below  Plan: Continue inpatient treatment and support. Treatment team working on disposition planning - patient interested in going to Rehab after discharge Continue Paxil 10 mgrs QHS  increase Neurontin to 300 mgrs BID  Labs negative.  Medical Decision Making Problem Points:  Established problem, stable/improving (1), Review of last therapy session (1) and Review of psycho-social stressors (1) Data Points:  Review or order clinical lab tests (1) Review of medication regiment & side effects (2)  I certify that inpatient services furnished can reasonably be expected to improve the patient's condition.   Dereck Leep 02/02/2014, 4:17 PM   Addendum: Pt reports occasional lower abdominal pain when urinating. Pt had calcium oxalate crystals on a previous UA, so she may have a renal stone. I advised pt to drink plenty of fluids, and she has tylenol PRN already ordered for pain.

## 2014-02-02 NOTE — Plan of Care (Signed)
Problem: Alteration in mood & ability to function due to Goal: STG-Patient will attend groups Outcome: Progressing Pt attended evening group on 02/01/14.

## 2014-02-03 MED ORDER — LIDOCAINE 5 % EX PTCH
1.0000 | MEDICATED_PATCH | CUTANEOUS | Status: DC
  Administered 2014-02-03 – 2014-02-04 (×2): 1 via TRANSDERMAL
  Filled 2014-02-03: qty 1
  Filled 2014-02-03: qty 14
  Filled 2014-02-03 (×2): qty 1
  Filled 2014-02-03: qty 14

## 2014-02-03 MED ORDER — NALTREXONE HCL 50 MG PO TABS
25.0000 mg | ORAL_TABLET | Freq: Every day | ORAL | Status: DC
  Administered 2014-02-03 – 2014-02-05 (×3): 25 mg via ORAL
  Filled 2014-02-03 (×2): qty 1
  Filled 2014-02-03: qty 7
  Filled 2014-02-03 (×2): qty 1
  Filled 2014-02-03: qty 7
  Filled 2014-02-03: qty 1

## 2014-02-03 NOTE — [facility] Group Notes (Signed)
[facility]  02/03/2014 1:15 PM   Type of Therapy:  Group Therapy  Participation Level:  Did Not Attend  Kristen IvanChelsea Horton, LCSW 02/03/2014 3:24 PM

## 2014-02-03 NOTE — Progress Notes (Signed)
Psychoeducational Group Note  Date:  02/03/2014 Time:  1015  Group Topic/Focus:  Making Healthy Choices:   The focus of this group is to help patients identify negative/unhealthy choices they were using prior to admission and identify positive/healthier coping strategies to replace them upon discharge.  Participation Level:  Active  Participation Quality:  Appropriate  Affect:  Appropriate  Cognitive:  Alert  Insight:  Improving  Engagement in Group:  Engaged  Additional Comments:  Pt participated and was focused on the topics presented. Gaining insight and looking into her behavior  Con Arganbright A 02/03/2014 

## 2014-02-03 NOTE — [facility] Group Notes (Signed)
[facility] Group Notes:  (Nursing/MHT/Case Management/Adjunct)  Date:  02/03/2014  Time:  3:05 PM  Type of Therapy:  Psychoeducational Skills  Participation Level:  Active  Participation Quality:  Appropriate  Affect:  Appropriate  Cognitive:  Appropriate  Insight:  Appropriate  Engagement in Group:  Engaged  Modes of Intervention:  Discussion  Summary of Progress/Problems: Pt did attend self inventory group, pt reported that she was negative SI/HI, no AH/VH noted. Pt rated her depression as a 6, and her helplessness/hopelessness as a 2.     Pt reported concerns about severe pain to shoulder with no relief from Tylenol, pt advised that the doctor will be made aware.   Jacquelyne BalintForrest, Mandalyn Pasqua Shanta 02/03/2014, 3:05 PM

## 2014-02-03 NOTE — Plan of Care (Signed)
Problem: Ineffective individual coping Goal: LTG: Patient will report a decrease in negative feelings Outcome: Progressing Pt states that she is feeling better overall and denies SI today.

## 2014-02-03 NOTE — Progress Notes (Addendum)
D) Pt has been attending the groups and interacting with her peers. Affect and mood are appropriate. Pt rates her depression at a 4, hopelessness at a 1 and her anxiety is an 8. Pt states that she is feeling better today and is looking at things with a brighter attitude. Continuing to work on her homework. A) Given support, reassurance and praise. Provided with a 1:1.  R) Denies SI and HI.

## 2014-02-03 NOTE — Progress Notes (Signed)
Patient did attend the evening speaker AA meeting.  

## 2014-02-03 NOTE — Progress Notes (Signed)
[facility] MD Progress Note  02/03/2014 3:26 PM Kristen Schneider  MRN:  [id number] Subjective:  Patient states she feels anxious, but remains very motivated in her recovery and ongoing treatment. She describes some anxiety and depressed, subjective restlessness. Pt continues to report dull aching pain of left shoulder (from torn left rotator cuff in 07/11/11). Objective:  I have met with patient and reviewed the chart. Patient presents with some anxiety, which she states is chronic. It does not appear to be related to withdrawal, as she has no significant tremors, diaphoresis, or psychomotor restlessness and her vitals are stable. She is experiencing cravings for cocaine and alcohol, but remains motivated is wanting to go to an inpatient rehabilitation program after her discharge from unit. At this time tolerating medications well, denies any side effects. We reviewed side effects and she is aware of the potential for SSRI to increase activation or self injurious thoughts in young adults. No disruptive  Behaviors on the unit . Has been going to groups/ participative in milieu. 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 UA was suspicious for UTI, but culture was neg.  But patient not endorsing symptoms nor having any fever. Diagnosis:  Polysubstance Dependence, Substance Induced Mood Disorder  Total Time spent with patient: 20 minutes    ADL's:  improving  Sleep: fair, but does not feel rested.  Appetite:good   Suicidal Ideation:  Denies any suicidal ideations Homicidal Ideation:  Denies any homicidal ideations AEB (as evidenced by):  Psychiatric Specialty Exam: Physical Exam  ROS  Blood pressure 103/60, pulse 86, temperature 98.3 F (36.8 C), temperature source Oral, resp. rate 16, height 5' 5.5" (1.664 m), weight 65.772 kg (145 lb), last menstrual period  01/25/2014, SpO2 99 %.Body mass index is 23.75 kg/(m^2).  General Appearance: improved grooming  Eye Contact::  Good  Speech:  Normal Rate  Volume:  Normal  Mood:  less depressed, but still constricted and anxious in affect  Affect:  anxious but reactive  Thought Process:  Goal Directed and Linear  Orientation:  Other:  fully alert and attentive  Thought Content:  denies hallucinations,no delusions and does not appear internally preoccupied   Suicidal Thoughts:  No- denies any self injurious or suicidal ideations at present   Homicidal Thoughts:  No  Memory:  Recent and Remote grossly intact  Judgement:  Fair  Insight:  Present  Psychomotor Activity:  Normal  Concentration:  Good  Recall:  Good  Fund of Knowledge:Good  Language: Good  Akathisia:  Negative  Handed:  Right  AIMS (if indicated):     Assets:  Desire for Improvement Physical Health Resilience  Sleep:  Number of Hours: 6.5   Musculoskeletal: Strength & Muscle Tone: within normal limits- at this time not presenting with any significant tremors or diaphoresis Gait & Station: normal Patient leans: N/A  Current Medications: Current Facility-Administered Medications  Medication Dose Route Frequency Provider Last Rate Last Dose  . acetaminophen (TYLENOL) tablet 650 mg  650 mg Oral Q6H PRN Laverle Hobby, PA-C   650 mg at 02/03/14 0542  . alum & mag hydroxide-simeth (MAALOX/MYLANTA) 200-200-20 MG/5ML suspension 30 mL  30 mL Oral Q4H PRN Laverle Hobby, PA-C      . gabapentin (NEURONTIN) capsule 300 mg  300 mg Oral BID Skip Estimable, MD   300 mg at 02/03/14 0820  .  hydrOXYzine (ATARAX/VISTARIL) tablet 25 mg  25 mg Oral Q6H PRN Laverle Hobby, PA-C   25 mg at 02/03/14 7416  . magnesium hydroxide (MILK OF MAGNESIA) suspension 30 mL  30 mL Oral Daily PRN Laverle Hobby, PA-C      . nicotine (NICODERM CQ - dosed in mg/24 hours) patch 21 mg  21 mg Transdermal Daily Jenne Campus, MD   21 mg at 02/03/14 0819  .  PARoxetine (PAXIL) tablet 10 mg  10 mg Oral QHS Jenne Campus, MD   10 mg at 02/02/14 2118  . traZODone (DESYREL) tablet 50 mg  50 mg Oral QHS,MR X 1 Laverle Hobby, PA-C   50 mg at 02/02/14 2118    Lab Results:  No results found for this or any previous visit (from the past 48 hour(s)).  Physical Findings: AIMS: Facial and Oral Movements Muscles of Facial Expression: None, normal Lips and Perioral Area: None, normal Jaw: None, normal Tongue: None, normal,Extremity Movements Upper (arms, wrists, hands, fingers): None, normal Lower (legs, knees, ankles, toes): None, normal, Trunk Movements Neck, shoulders, hips: None, normal, Overall Severity Severity of abnormal movements (highest score from questions above): None, normal Incapacitation due to abnormal movements: None, normal Patient's awareness of abnormal movements (rate only patient's report): No Awareness, Dental Status Current problems with teeth and/or dentures?: No Does patient usually wear dentures?: No  CIWA:  CIWA-Ar Total: 0 COWS:  COWS Total Score: 0   Assessment- Patient is motivated in her recovery efforts. She is hopeful of going to a Rehab upon discharge from our unit. She is not presenting with current withdrawal symptoms. She does have some chronic anxiety. As noted, she states that Neurontin and Paxil have been effective in the past  and she has been restarted on these meds .  Treatment Plan Summary: Daily contact with patient to assess and evaluate symptoms and progress in treatment Medication management See below  Plan: Continue inpatient treatment and support. Treatment team working on disposition planning - patient interested in going to Rehab after discharge Continue Paxil 10 mgrs QHS  Continue Neurontin to 300 mgrs BID Will order lidoderm patch for left shoulder. Will start naltrexone for alcohol/cocaine cravings. Labs negative. Lower abdominal pain has resolved, no dysuria today.  Medical Decision  Making Problem Points:  Established problem, stable/improving (1), Review of last therapy session (1) and Review of psycho-social stressors (1) Data Points:  Review or order clinical lab tests (1) Review of medication regiment & side effects (2)  I certify that inpatient services furnished can reasonably be expected to improve the patient's condition.   Dereck Leep 02/03/2014, 3:26 PM

## 2014-02-04 DIAGNOSIS — F1020 Alcohol dependence, uncomplicated: Secondary | ICD-10-CM

## 2014-02-04 MED ORDER — NALTREXONE HCL 50 MG PO TABS: 25.0000 mg | ORAL_TABLET | Freq: Every day | ORAL | Status: DC

## 2014-02-04 MED ORDER — TRAZODONE HCL 50 MG PO TABS: 50.0000 mg | ORAL_TABLET | Freq: Every evening | ORAL | Status: DC | PRN

## 2014-02-04 MED ORDER — GABAPENTIN 300 MG PO CAPS
300.0000 mg | ORAL_CAPSULE | Freq: Three times a day (TID) | ORAL | Status: DC
  Administered 2014-02-04 – 2014-02-05 (×2): 300 mg via ORAL
  Filled 2014-02-04 (×5): qty 42
  Filled 2014-02-04 (×2): qty 1
  Filled 2014-02-04: qty 42

## 2014-02-04 MED ORDER — PAROXETINE HCL 10 MG PO TABS: 10.0000 mg | ORAL_TABLET | Freq: Every day | ORAL | Status: DC

## 2014-02-04 MED ORDER — QUETIAPINE FUMARATE 25 MG PO TABS: 25.0000 mg | ORAL_TABLET | Freq: Two times a day (BID) | ORAL | Status: DC | PRN

## 2014-02-04 MED ORDER — LIDOCAINE 5 % EX PTCH: 1.0000 | MEDICATED_PATCH | CUTANEOUS | Status: DC

## 2014-02-04 MED ORDER — GABAPENTIN 300 MG PO CAPS: 300.0000 mg | ORAL_CAPSULE | Freq: Three times a day (TID) | ORAL | Status: DC

## 2014-02-04 MED ORDER — QUETIAPINE FUMARATE 25 MG PO TABS
25.0000 mg | ORAL_TABLET | Freq: Two times a day (BID) | ORAL | Status: DC | PRN
  Administered 2014-02-04 – 2014-02-05 (×2): 25 mg via ORAL
  Filled 2014-02-04 (×2): qty 1
  Filled 2014-02-04: qty 20

## 2014-02-04 NOTE — [facility] Group Notes (Signed)
   [facility]  02/04/2014  8:45 AM   Participation Quality: Alert, Appropriate and Oriented  Mood/Affect: Anxious  Depression Rating: 4  Anxiety Rating: 10  Thoughts of Suicide: Pt denies SI/HI  Will you contract for safety? Yes  Current AVH: Pt denies  Plan for Discharge/Comments: Pt attended discharge planning group and actively participated in group. CSW provided pt with today's workbook. Patient reports feeling "not so well" today due to experiencing shoulder pain. [facility] at 8 am for admission screening.  Transportation Means: Pt reports access to transportation  Supports: No supports mentioned at this time  Samuella BruinKristin Latreshia Beauchaine, MSW, [employer]CSWA Clinical Social Worker [facility] [phone]

## 2014-02-04 NOTE — Plan of Care (Signed)
Problem: Alteration in mood Goal: LTG-Patient reports reduction in suicidal thoughts (Patient reports reduction in suicidal thoughts and is able to verbalize a safety plan for whenever patient is feeling suicidal)  Outcome: Progressing Pt denied SI this shift.  She verbally contracted for safety.       

## 2014-02-04 NOTE — Clinical Social Work Note (Signed)
Per patient, her mother will pick her up at 7 am [facility]  Samuella BruinKristin Shyra Emile, MSW, [employer]CSWA Clinical Social Worker [facility] [phone]

## 2014-02-04 NOTE — B[facility] Suicide Risk Assessment (Addendum)
Demographic Factors:  24 year old female, separated, no children  Total Time spent with patient: 30 minutes  Psychiatric Specialty Exam: Physical Exam  ROS  Blood pressure 108/68, pulse 82, temperature 97.7 F (36.5 C), temperature source Oral, resp. rate 16, height 5' 5.5" (1.664 m), weight 145 lb (65.772 kg), last menstrual period 01/25/2014, SpO2 99 %.Body mass index is 23.75 kg/(m^2).  General Appearance: improved grooming  Eye Contact::  Good  Speech:  Normal Rate  Volume:  Normal  Mood:  improved and currently euthymic  Affect:  improved, full range of affect, some anxiety  Thought Process:  Goal Directed and Linear  Orientation:  Full (Time, Place, and Person)  Thought Content:  denies hallucinations, no delusions  Suicidal Thoughts:  No at this time denies any thoughts of hurting self and  contracts for safety on unit   Homicidal Thoughts:  No  Memory: Recent and Remote grossly intact  Judgement:  Other:  improved  Insight:  Present  Psychomotor Activity:  Normal  Concentration:  Good  Recall:  Good  Fund of Knowledge:Good  Language: Good  Akathisia:  Negative  Handed:  Right  AIMS (if indicated):     Assets:  Communication Skills Desire for Improvement Physical Health Resilience  Sleep:  Number of Hours: 6.5    Musculoskeletal: Strength & Muscle Tone: within normal limits Gait & Station: normal Patient leans: N/A   Mental Status Per Nursing Assessment::   On Admission:  Self-harm behaviors  Current Mental Status by Physician: As noted, patient is currently improved, with a fuller range of affect, improved mood, no thought disorder, no suicidal or homicidal ideations, no psychotic symptoms, and is at present future oriented, wanting to go to inpatient rehab as disposition to continue working on recovery.  Loss Factors: Death of mother  Historical Factors: Long history of poly-substance dependence, history of self cutting , history of OD  Risk  Reduction Factors:   Sense of responsibility to family, NA and high level of motivation at present  Continued Clinical Symptoms:  At this time improved, no SI , no HI, no psychosis, not depressed, still anxious, but future oriented and optimistic about ongoing efforts to consolidate sobriety. Wanting to go to an inpatient rehab after discharge. * Vistaril D/Cd due to concern that it could be causing some paradoxical anxiety- replaced with low dose Seroquel PRNS for severe anxiety. * Of note, we discussed medication side effects, to include those from naltrexone, and its opiate blocking properties.  * Of note, U/A suggestive of UTI, but patient's UCX was negative, and she has no symptoms or fever.  Cognitive Features That Contribute To Risk:  No gross cognitive deficits noted upon discharge. Is alert , attentive, and oriented x 3   Suicide Risk:  Mild:  Suicidal ideation of limited frequency, intensity, duration, and specificity.  There are no identifiable plans, no associated intent, mild dysphoria and related symptoms, good self-control (both objective and subjective assessment), few other risk factors, and identifiable protective factors, including available and accessible social support.  Discharge Diagnoses:   AXIS I:  Polysubstance Dependence - Substance Induced Mood Disorder AXIS II:  Deferred AXIS III:   Past Medical History  Diagnosis Date  . Torn rotator cuff left shoulder  . Shingles 2006   AXIS IV:  problems related to social environment and death of mother AXIS V: 63 upon discharge  Plan Of Care/Follow-up recommendations:  Activity:  As tolerated Diet:  regular Tests:  NA Other:  See  below  Is patient on multiple antipsychotic therapies at discharge:  No   Has Patient had three or more failed trials of antipsychotic monotherapy by history:  No  Recommended Plan for Multiple Antipsychotic Therapies: NA  At this time patient is in good spirits regarding discharge  which is scheduled for tomorrow early in AM. [facility]  at 8:00 AM  Kristen Schneider 02/04/2014, 3:56 PM

## 2014-02-04 NOTE — Progress Notes (Signed)
[facility] :  Will you be returning to the same living situation after discharge: No. Patient will [facility] for [facility] screening At discharge, do you have transportation home?:Yes,  Patient's mother will provide transportaiton Do you have the ability to pay for your medications:Yes,  patient will be provided with medication samples and prescriptions at discharge  Release of information consent forms completed and in the chart;  Patient's signature needed at discharge.  Patient to Follow up at: Follow-up Information    [facility] On 02/05/2014.   Why:  Tuesday, February 05, 2014 at [facility]8AM for an admission assessment.   Contact information:   [address] [place], [place]   [address]  [phone]      Patient denies SI/HI:   Yes,  denies    Safety Planning and Suicide Prevention discussed:  Yes,  with patient and family member    Alysah Carton, [place] CarboKristin L 02/04/2014, 5:37 PM

## 2014-02-04 NOTE — Progress Notes (Addendum)
D:  Patient denied SI & HI.  Denied A/V hallucinations.  Denied pain. A:  Medications administered per MD orders.  Emotional support and encouragement given patient. R:  Safety maintained with 15 minute checks.  Patient's self inventory sheet, patient slept good last night, no sleep medication given.  Good appetite, low energy level, poor concentration.  Rated depression 4, hopeless 1, anxiety 10.  Withdrawals of cravings.  Denied SI.  Physical problems of pain L shoulder #7 pain.  Goal is pain, anxiety today.  Plans to take meds, talk to MD. Does have discharge plans.  No problems anticipated after discharge.

## 2014-02-04 NOTE — Plan of Care (Signed)
Problem: Consults Goal: Depression Patient Education See Patient Education Module for education specifics.  Outcome: Completed/Met Date Met:  02/04/14 Nurse discussed depression with patient.        

## 2014-02-04 NOTE — Progress Notes (Signed)
D: Pt has anxious affect and mood.  She reports her day has been "pretty good."  Pt reports "my anxiety has been really really bad though."  Pt reports that her lidocaine patch is helping with her left shoulder pain.  She reports her goal today was "to get my cravings and anxiety under control."  Pt reports feeling less depressed today. She denies SI/HI, denies hallucinations.  Pt attended evening group and interacted appropriately with staff and peers.   A: Medication administered per order.  Pt provided with handout on Naltrexone per request.  Safety maintained.  Met with pt 1:1 and offered support and encouragement.   R: Pt is compliant with medications.  She verbally contracts for safety.  Pt reports she will notify staff of needs and concerns.  Will continue to monitor and assess for safety.

## 2014-02-04 NOTE — Progress Notes (Addendum)
D: Pt denies SI/HI/AVH. Pt is pleasant and cooperative. Pt focused on shoulder and pain. Pt offered heat/ Ice and pt refused. Pt said ready to [facility].   A: Pt was offered support and encouragement. Pt was given scheduled medications. Pt was encourage to attend groups. Q 15 minute checks were done for safety.   R:Pt attends groups and interacts well with peers and staff. Pt is taking medication. Pt has no complaints at this time .Pt receptive to treatment and safety maintained on unit.

## 2014-02-04 NOTE — Plan of Care (Signed)
Problem: Ineffective individual coping Goal: LTG: Patient will report a decrease in negative feelings Outcome: Completed/Met Date Met:  02/04/14 Pt denies SI/hi/AVH at this time Goal: STG: Patient will remain free from self harm Outcome: Completed/Met Date Met:  02/04/14 Pt not harmed self since being in [facility] due to documentation.

## 2014-02-04 NOTE — [facility] Group Notes (Signed)
[facility] LCSW Group Therapy 02/04/2014  1:15 pm  Type of Therapy: Group Therapy Participation Level: Active  Participation Quality: Attentive, Sharing and Supportive  Affect: Appropriate  Cognitive: Alert and Oriented  Insight: Developing/Improving and Engaged  Engagement in Therapy: Developing/Improving and Engaged  Modes of Intervention: Clarification, Confrontation, Discussion, Education, Exploration,  Limit-setting, Orientation, Problem-solving, Rapport Building, Dance movement psychotherapisteality Testing, Socialization and Support  Summary of Progress/Problems: Pt identified obstacles faced currently and processed barriers involved in overcoming these obstacles. Pt identified steps necessary for overcoming these obstacles and explored motivation (internal and external) for facing these difficulties head on. Pt further identified one area of concern in their lives and chose a goal to focus on for today. Patient was present during group but left temporarily to speak with the physician. She actively listened but did not participate in discussion.  Samuella BruinKristin Andrzej Scully, MSW, [employer]CSWA Clinical Social Worker [facility] [phone]

## 2014-02-04 NOTE — Discharge Summary (Signed)
Physician Discharge Summary Note  Patient:  Kristen Schneider is an 24 y.o., female MRN:  [id number] DOB:  01/01/1991 Patient phone:  [phone] (home)  Patient address:   [address] [place] [address],  Total Time spent with patient: Greater than 30 minutes  Date of Admission:  01/30/2014  Date of Discharge: 02-04-14  Reason for Admission: Alcohol detox  Discharge Diagnoses: Principal Problem:   Alcohol dependence with alcohol-induced mood disorder Active Problems:   Substance induced mood disorder   Psychiatric Specialty Exam: Physical Exam  Psychiatric: Her speech is normal and behavior is normal. Judgment and thought content normal. Her mood appears not anxious. Her affect is not angry, not blunt, not labile and not inappropriate. Cognition and memory are normal. She does not exhibit a depressed mood.    Review of Systems  Constitutional: Negative.   HENT: Negative.   Eyes: Negative.   Respiratory: Negative.   Cardiovascular: Negative.   Gastrointestinal: Negative.   Genitourinary: Negative.   Musculoskeletal: Negative.   Skin: Negative.   Neurological: Negative.   Endo/Heme/Allergies: Negative.   Psychiatric/Behavioral: Positive for depression (Stable) and substance abuse (Alcohol dependence). Negative for suicidal ideas, hallucinations and memory loss. The patient has insomnia (Stable). The patient is not nervous/anxious.     Blood pressure 113/73, pulse 79, temperature 98 F (36.7 C), temperature source Oral, resp. rate 18, height 5' 5.5" (1.664 m), weight 65.772 kg (145 lb), last menstrual period 01/25/2014, SpO2 99 %.Body mass index is 23.75 kg/(m^2).  See MD's SRA           Past Psychiatric History: Diagnosis: Substance abuse  Hospitalizations: Denies  Outpatient Care: Denies  Substance Abuse Care: ETOH, heroin, crack and cocaine  Self-Mutilation: Hx of cutting  Suicidal Attempts: Hx of OD on ADHD meds and sleeping agents   Violent Behaviors: Denies   Musculoskeletal: Strength & Muscle Tone: within normal limits Gait & Station: normal Patient leans: N/A  DSM5: Schizophrenia Disorders:  NA Obsessive-Compulsive Disorders:  NA Trauma-Stressor Disorders:  NA Substance/Addictive Disorders:  Alcohol Related Disorder - Severe (303.90) Depressive Disorders:  Substance induced mood disorder  Axis Diagnosis:  AXIS I:  Alcohol dependence, Substance induced mood disorder AXIS II:  Deferred AXIS III:   Past Medical History  Diagnosis Date  . Torn rotator cuff left shoulder  . Shingles 2006   AXIS IV:  other psychosocial or environmental problems and Alcoholism, chronic AXIS V:  63  Level of Care:  RTC  Hospital Course:  Kristen Schneider is a 24 yo female patient who came in to get help with her substance abuse. She said she has been abusing various substances since she was 24 yrs old. She is currently using heroin, crack and cocaine. She also drink vodka and can consume daily. Her UDS was positive for cocaine and THC.  While a patient in this hospital, [name] received medication management for mood control and stabilization. She was medicated and discharged on; Gabapentin 300 mg three times daily for agitation/pain, Naltrexone 50 mg daily for alcoholism/Opioid addiction, Paxil 10 mg daily for depression, Seroquel 25 mg Q bedtime for mood control & Trazodone 50 mg Q bedtime for sleep. Her discharge plan includes a referral & an appointment to a substance abuse treatment center for further substance abuse treatment. [name] was enrolled & participated in the group counseling sessions being offered and held on this unit. She learned coping skills. She received other medication management for her other medical issues and concerns, such as Lidocaine patch  for pain management. She tolerated her treatment regimen without any significant adverse effects and or reactions reported.  Clint symptoms has stabilized  and an appointment has been scheduled for her for an assessment to continue substance abuse treatment. She is being discharged to the [facility] treatment center in [place] for that. She is provided with all the pertinent information required to make this appointment without problems. Upon discharge, she adamantly denies any SIHI, AVH, delusional thoughts, paranoia and or substance withdrawal symptoms. She received from the [facility] pharmacy, a 14 days worth, supply samples of her [facility] discharge medications. She left [facility] with all personal belongings in no distress. Transportation per mother.  Consults:  psychiatry  Significant Diagnostic Studies:  labs: CBC with diff, CMP, UDS, toxicology tests, U/A, results reviewed, no changes  Discharge Vitals:   Blood pressure 113/73, pulse 79, temperature 98 F (36.7 C), temperature source Oral, resp. rate 18, height 5' 5.5" (1.664 m), weight 65.772 kg (145 lb), last menstrual period 01/25/2014, SpO2 99 %. Body mass index is 23.75 kg/(m^2). Lab Results:   No results found for this or any previous visit (from the past 72 hour(s)).  Physical Findings: AIMS: Facial and Oral Movements Muscles of Facial Expression: None, normal Lips and Perioral Area: None, normal Jaw: None, normal Tongue: None, normal,Extremity Movements Upper (arms, wrists, hands, fingers): None, normal Lower (legs, knees, ankles, toes): None, normal, Trunk Movements Neck, shoulders, hips: None, normal, Overall Severity Severity of abnormal movements (highest score from questions above): None, normal Incapacitation due to abnormal movements: None, normal Patient's awareness of abnormal movements (rate only patient's report): No Awareness, Dental Status Current problems with teeth and/or dentures?: No Does patient usually wear dentures?: No  CIWA:  CIWA-Ar Total: 1 COWS:  COWS Total Score: 2  Psychiatric Specialty Exam: See Psychiatric Specialty Exam and Suicide Risk  Assessment completed by Attending Physician prior to discharge.  Discharge destination:  Home  Is patient on multiple antipsychotic therapies at discharge:  No   Has Patient had three or more failed trials of antipsychotic monotherapy by history:  No  Recommended Plan for Multiple Antipsychotic Therapies: NA    Medication List    TAKE these medications      Indication   gabapentin 300 MG capsule  Commonly known as:  NEURONTIN  Take 1 capsule (300 mg total) by mouth 3 (three) times daily. For agitation/pain management   Indication:  Agitation, Pain     lidocaine 5 %  Commonly known as:  LIDODERM  Place 1 patch onto the skin daily. Remove & Discard patch within 12 hours or as directed by MD: For pain   Indication:  Pain management     naltrexone 50 MG tablet  Commonly known as:  DEPADE  Take 0.5 tablets (25 mg total) by mouth daily. For alcoholism.opioid dependence   Indication:  Excessive Use of Alcohol, Opioid Dependence     PARoxetine 10 MG tablet  Commonly known as:  PAXIL  Take 1 tablet (10 mg total) by mouth at bedtime. For depression   Indication:  Major Depressive Disorder     QUEtiapine 25 MG tablet  Commonly known as:  SEROQUEL  Take 1 tablet (25 mg total) by mouth 2 (two) times daily as needed (Anxiety).   Indication:  Anxiety     traZODone 50 MG tablet  Commonly known as:  DESYREL  Take 1 tablet (50 mg total) by mouth at bedtime and may repeat dose one time if needed. For sleep  Indication:  Trouble Sleeping       Follow-up Information    Follow up with [facility] On 02/05/2014.   Why:  Tuesday, February 05, 2014 at [facility] for an admission assessment.   Contact information:   [address] [place], [place]   [address]  [phone]     Follow-up recommendations: Activity:  As tolerated Diet: As recommended by your primary care doctor. Keep all scheduled follow-up appointments as recommended.   Comments:  Take all your medications as  prescribed by your mental healthcare provider. Report any adverse effects and or reactions from your medicines to your outpatient provider promptly. Patient is instructed and cautioned to not engage in alcohol and or illegal drug use while on prescription medicines. In the event of worsening symptoms, patient is instructed to call the crisis hotline, 911 and or go to the nearest ED for appropriate evaluation and treatment of symptoms. Follow-up with your primary care provider for your other medical issues, concerns and or health care needs.   Total Discharge Time:  Greater than 30 minutes.  Signed: Sanjuana Kava, PMHNP, FNP-BC 02/05/2014, 3:56 PM   Patient seen, Suicide Assessment Completed.  Disposition Plan Reviewed Patient seen, Suicide Assessment Completed.  Disposition Plan Reviewed

## 2014-02-05 NOTE — Progress Notes (Signed)
Nurs Dischg Note:  D:Patient denies SI/HI/AVH at this time. Pt appears calm and cooperative, and no distress noted. Pt [facility]  A: All Personal items in locker returned to pt. Pt escorted out of the building.  R:  Pt States she will comply with outpatient services, and take MEDS as prescribed. Keep with family and support system

## 2014-02-05 NOTE — Progress Notes (Signed)
[facility] :  Will you be returning to the same living situation after discharge: No.  Patient [facility]. At discharge, do you have transportation home?:Yes,  [facility]. Do you have the ability to pay for your medications:No.  Patient assisted with indigent medications.  Release of information consent forms completed and in the chart;  Patient's signature needed at discharge.  Patient to Follow up at: Follow-up Information    [facility] On 02/05/2014.   Why:  Tuesday, February 05, 2014 at [facility] for an admission assessment.   Contact information:   [address] [place], [place]   [address]  [phone]      Patient denies SI/HI:   Per, RN Patient no longer endorsing SI/HI or other thoughts of self harm.     Safety Planning and Suicide Prevention discussed: .Reviewed with all patients during discharge planning group  Patient refused referral  Wynn BankerHodnett, Aviv Lengacher Hairston 02/05/2014, 10:37 AM

## 2014-02-07 NOTE — Progress Notes (Signed)
Patient Discharge Instructions:  After Visit Summary (AVS):   Faxed to:  02/07/14 Discharge Summary Note:   Faxed to:  02/07/14 Psychiatric Admission Assessment Note:   Faxed to:  02/07/14 Suicide Risk Assessment - Discharge Assessment:   Faxed to:  02/07/14 Faxed/Sent to the Next Level Care provider:  02/07/14 [facility]ax[facility] to [facility] @ [phone] Jerelene ReddenSheena E Orogrande, 02/07/2014, 3:46 PM

## 2014-04-14 ENCOUNTER — Emergency Department (HOSPITAL_COMMUNITY)
Admission: EM | Admit: 2014-04-14 | Discharge: 2014-04-14 | Disposition: A | Discharge status: Home / Self Care | Payer: Self-pay | Attending: Emergency Medicine | Admitting: Emergency Medicine

## 2014-04-14 ENCOUNTER — Encounter (HOSPITAL_COMMUNITY): Payer: Self-pay | Admitting: Emergency Medicine

## 2014-04-14 DIAGNOSIS — Z79899 Other long term (current) drug therapy: Secondary | ICD-10-CM | POA: Insufficient documentation

## 2014-04-14 DIAGNOSIS — Z8619 Personal history of other infectious and parasitic diseases: Secondary | ICD-10-CM | POA: Insufficient documentation

## 2014-04-14 DIAGNOSIS — N939 Abnormal uterine and vaginal bleeding, unspecified: Secondary | ICD-10-CM | POA: Insufficient documentation

## 2014-04-14 DIAGNOSIS — Z3202 Encounter for pregnancy test, result negative: Secondary | ICD-10-CM | POA: Insufficient documentation

## 2014-04-14 DIAGNOSIS — N739 Female pelvic inflammatory disease, unspecified: Secondary | ICD-10-CM | POA: Insufficient documentation

## 2014-04-14 DIAGNOSIS — Z8739 Personal history of other diseases of the musculoskeletal system and connective tissue: Secondary | ICD-10-CM | POA: Insufficient documentation

## 2014-04-14 DIAGNOSIS — Z79891 Long term (current) use of opiate analgesic: Secondary | ICD-10-CM | POA: Insufficient documentation

## 2014-04-14 DIAGNOSIS — R42 Dizziness and giddiness: Secondary | ICD-10-CM | POA: Insufficient documentation

## 2014-04-14 DIAGNOSIS — Z720 Tobacco use: Secondary | ICD-10-CM | POA: Insufficient documentation

## 2014-04-14 DIAGNOSIS — A599 Trichomoniasis, unspecified: Secondary | ICD-10-CM | POA: Insufficient documentation

## 2014-04-14 DIAGNOSIS — N730 Acute parametritis and pelvic cellulitis: Secondary | ICD-10-CM

## 2014-04-14 HISTORY — DX: Irregular menstruation, unspecified: N92.6

## 2014-04-14 HISTORY — DX: Anxiety disorder, unspecified: F41.9

## 2014-04-14 HISTORY — DX: Other psychoactive substance abuse, uncomplicated: F19.10

## 2014-04-14 LAB — I-STAT CHEM 8, ED
BUN: 15 mg/dL (ref 6–23)
CHLORIDE: 104 mmol/L (ref 96–112)
Calcium, Ion: 1.19 mmol/L (ref 1.12–1.23)
Creatinine, Ser: 0.8 mg/dL (ref 0.50–1.10)
Glucose, Bld: 116 mg/dL — ABNORMAL HIGH (ref 70–99)
HEMATOCRIT: 39 % (ref 36.0–46.0)
HEMOGLOBIN: 13.3 g/dL (ref 12.0–15.0)
Potassium: 3.4 mmol/L — ABNORMAL LOW (ref 3.5–5.1)
Sodium: 141 mmol/L (ref 135–145)
TCO2: 20 mmol/L (ref 0–100)

## 2014-04-14 LAB — WET PREP, GENITAL: YEAST WET PREP: NONE SEEN

## 2014-04-14 LAB — POC URINE PREG, ED: Preg Test, Ur: NEGATIVE

## 2014-04-14 MED ORDER — LIDOCAINE HCL (PF) 1 % IJ SOLN: 2.0000 mL | Freq: Once | INTRAMUSCULAR | Status: DC

## 2014-04-14 MED ORDER — LIDOCAINE HCL (PF) 1 % IJ SOLN
2.0000 mL | Freq: Once | INTRAMUSCULAR | Status: AC
  Administered 2014-04-14: 1 mL
  Filled 2014-04-14: qty 30

## 2014-04-14 MED ORDER — DOXYCYCLINE HYCLATE 100 MG PO CAPS: 100.0000 mg | ORAL_CAPSULE | Freq: Two times a day (BID) | ORAL | Status: DC

## 2014-04-14 MED ORDER — METRONIDAZOLE 500 MG PO TABS
2000.0000 mg | ORAL_TABLET | Freq: Once | ORAL | Status: AC
Start: 2014-04-14 — End: 2014-04-14
  Administered 2014-04-14: 2000 mg via ORAL
  Filled 2014-04-14: qty 4

## 2014-04-14 MED ORDER — CEFTRIAXONE SODIUM 250 MG IJ SOLR
250.0000 mg | Freq: Once | INTRAMUSCULAR | Status: AC
  Administered 2014-04-14: 250 mg via INTRAMUSCULAR
  Filled 2014-04-14: qty 250

## 2014-04-14 MED ORDER — AZITHROMYCIN 250 MG PO TABS
1000.0000 mg | ORAL_TABLET | Freq: Once | ORAL | Status: AC
  Administered 2014-04-14: 1000 mg via ORAL
  Filled 2014-04-14: qty 4

## 2014-04-14 NOTE — ED Notes (Signed)
Urine Preg POC bedside reads Negative.

## 2014-04-14 NOTE — Discharge Instructions (Signed)
Abnormal Uterine Bleeding Abnormal uterine bleeding means bleeding from the vagina that is not your normal menstrual period. This can be:  Bleeding or spotting between periods.  Bleeding after sex (sexual intercourse).  Bleeding that is heavier or more than normal.  Periods that last longer than usual.  Bleeding after menopause. There are many problems that may cause this. Treatment will depend on the cause of the bleeding. Any kind of bleeding that is not normal should be reviewed by your doctor.  HOME CARE Watch your condition for any changes. These actions may lessen any discomfort you are having:  Do not use tampons or douches as told by your doctor.  Change your pads often. You should get regular pelvic exams and Pap tests. Keep all appointments for tests as told by your doctor. GET HELP IF:  You are bleeding for more than 1 week.  You feel dizzy at times. GET HELP RIGHT AWAY IF:   You pass out.  You have to change pads every 15 to 30 minutes.  You have belly pain.  You have a fever.  You become sweaty or weak.  You are passing large blood clots from the vagina.  You feel sick to your stomach (nauseous) and throw up (vomit). MAKE SURE YOU:  Understand these instructions.  Will watch your condition.  Will get help right away if you are not doing well or get worse. Document Released: 11/08/2008 Document Revised: 01/16/2013 Document Reviewed: 08/10/2012 [facility] Patient Information 2015 [employer], [place]. This information is not intended to replace advice given to you by your health care provider. Make sure you discuss any questions you have with your health care provider.  Pelvic Inflammatory Disease Pelvic inflammatory disease (PID) is an infection in some or all of the female organs. PID can be in the uterus, ovaries, fallopian tubes, or the surrounding tissues inside the lower belly area (pelvis). HOME CARE   If given, take your antibiotic medicine as told.  Finish them even if you start to feel better.  Only take medicine as told by your doctor.  Do not have sex (intercourse) until treatment is done or as told by your doctor.  Tell your sex partner if you have PID. Your partner may need to be treated.  Keep all doctor visits. GET HELP RIGHT AWAY IF:   You have a fever.  You have more belly (abdominal) or lower belly pain.  You have chills.  You have pain when you pee (urinate).  You are not better after 72 hours.  You have more fluid (discharge) coming from your vagina or fluid that is not normal.  You need pain medicine from your doctor.  You throw up (vomit).  You cannot take your medicines.  Your partner has a sexually transmitted disease (STD). MAKE SURE YOU:   Understand these instructions.  Will watch your condition.  Will get help right away if you are not doing well or get worse. Document Released: 04/09/2008 Document Revised: 05/08/2012 Document Reviewed: 01/07/2011 [facility] Patient Information 2015 [employer], [place]. This information is not intended to replace advice given to you by your health care provider. Make sure you discuss any questions you have with your health care provider.

## 2014-04-14 NOTE — ED Notes (Signed)
Pt from home with c/o abnormal vaginal bleeding with hx of the same.  Pt reports having a period Feb 27th - March 8th, starting Trisprintec on March 6th for birth control use not menstrual symptoms, and a period starting again March 13th - current.  Pt reports being dx with an abnormal period lasting approx 9 months.  Pt in NAD, A&O.

## 2014-04-14 NOTE — ED Provider Notes (Signed)
CSN: [id number]     Arrival date & time 04/14/14  4098 History   First MD Initiated Contact with Patient 04/14/14 [id number]     Chief Complaint  Patient presents with  . Vaginal Bleeding     (Consider location/radiation/quality/duration/timing/severity/associated sxs/prior Treatment) HPI   24 year old female with history of polysubstance abuse, abnormal menstrual period, alcohol abuse presenting for evaluation of abnormal vaginal bleeding. Patient reports she has had abnormal vaginal bleeding. This started on February 27 and the lasted to March 8. She would have to change her tampon every hour and a half. Bleeding briefly stop from the 8th-13th but once again has resumed. She noticed small amount of clots. Also endorsed low abdominal cramping, lightheadedness, and dizziness. She started her birth control in March to 6 primarily for birth control use only. She reports having abnormal vaginal bleeding last year that lasted for 9 months. States she has extensive workup including both ultrasound and blood work but no definitive diagnosis were made. She denies any other abnormal bleeding, no bleeding gums, rectal bleeding, or easy bruising. She admits to history of alcohol abuse but has not been drinking for several months. She denies any pain with sexual activities, no vaginal discharge, dysuria, back pain or rectal pain. She does not have a primary care provider and can no longer follow-up with her OB/GYN due to lack of insurance. Denies any prior history of endometriosis, ovarian cyst. No history of uterine fibroid.  Past Medical History  Diagnosis Date  . Torn rotator cuff left shoulder  . Shingles 2006  . Polysubstance abuse   . Abnormal menstrual periods    Past Surgical History  Procedure Laterality Date  . Tonsillectomy    . Wisdom tooth extraction    . Polypectomy    . Other surgical history      Intestinal polyps as a child   Family History  Problem Relation Age of Onset  . Alcoholism  Father    History  Substance Use Topics  . Smoking status: Current Every Day Smoker -- 0.50 packs/day    Types: Cigarettes  . Smokeless tobacco: Former Neurosurgeon  . Alcohol Use: No   OB History    No data available     Review of Systems  All other systems reviewed and are negative.     Allergies  Review of patient's allergies indicates no known allergies.  Home Medications   Prior to Admission medications   Medication Sig Start Date End Date Taking? Authorizing Provider  gabapentin (NEURONTIN) 300 MG capsule Take 1 capsule (300 mg total) by mouth 3 (three) times daily. For agitation/pain management 02/04/14   Sanjuana Kava, NP  lidocaine (LIDODERM) 5 % Place 1 patch onto the skin daily. Remove & Discard patch within 12 hours or as directed by MD: For pain 02/04/14   Sanjuana Kava, NP  naltrexone (DEPADE) 50 MG tablet Take 0.5 tablets (25 mg total) by mouth daily. For alcoholism.opioid dependence 02/04/14   Sanjuana Kava, NP  PARoxetine (PAXIL) 10 MG tablet Take 1 tablet (10 mg total) by mouth at bedtime. For depression 02/04/14   Sanjuana Kava, NP  QUEtiapine (SEROQUEL) 25 MG tablet Take 1 tablet (25 mg total) by mouth 2 (two) times daily as needed (Anxiety). 02/04/14   Sanjuana Kava, NP  traZODone (DESYREL) 50 MG tablet Take 1 tablet (50 mg total) by mouth at bedtime and may repeat dose one time if needed. For sleep 02/04/14   Sanjuana Kava, NP  BP 108/58 mmHg  Pulse 82  Temp(Src) 98.8 F (37.1 C) (Oral)  Resp 20  SpO2 97% Physical Exam  Constitutional: She appears well-developed and well-nourished. No distress.  HENT:  Head: Normocephalic and atraumatic.  Eyes: Conjunctivae are normal.  Neck: Normal range of motion. Neck supple.  Cardiovascular: Normal rate and regular rhythm.   Pulmonary/Chest: Effort normal and breath sounds normal. She exhibits no tenderness.  Abdominal: Soft. There is no tenderness.  Genitourinary: Uterus normal. There is no rash or lesion on the right  labia. There is no rash or lesion on the left labia. Cervix exhibits motion tenderness. Cervix exhibits no discharge. Right adnexum displays tenderness. Right adnexum displays no mass. Left adnexum displays no mass and no tenderness. There is bleeding (scant amount of blood noted in vaginal vault) in the vagina. No erythema or tenderness in the vagina. No vaginal discharge found.  Chaperone present during exam.  Lymphadenopathy:       Right: No inguinal adenopathy present.       Left: No inguinal adenopathy present.  Nursing note and vitals reviewed.   ED Course  Procedures (including critical care time)  9:17 AM Abnormal uterine bleeding in a patient with history of the same. She does have some mild right adnexal tenderness and cervical motion tenderness concerning for PID. She is afebrile, vital signs stable, and having a nonsurgical abdomen.  10:12 AM Wet prep shows a few Trichomonas, a few clue cells, and many WBC. This finding is concerning for STD. I discussed the finding with patient and also encourage her boyfriend to get tested as well. Patient will receive Flagyl 2 g by mouth, Rocephin 250 mg IM, and Zithromax 1000 mg by mouth. She will be discharged with doxycycline for 2 weeks as treatment for suspect PID. HIV, syphilis, GC and Chlamydia culture has been sent. Patient recommended to avoid sexual activities until symptoms completely resolved. As far as her vaginal bleeding, her hemoglobin is stable at 13.3. Will also refer to [facility] for outpatient follow-up as needed.  Recommend sexual partner to get tested as well.    Labs Review Labs Reviewed  WET PREP, GENITAL - Abnormal; Notable for the following:    Trich, Wet Prep FEW (*)    Clue Cells Wet Prep HPF POC FEW (*)    WBC, Wet Prep HPF POC MANY (*)    All other components within normal limits  I-STAT CHEM 8, ED - Abnormal; Notable for the following:    Potassium 3.4 (*)    Glucose, Bld 116 (*)    All other components  within normal limits  RPR  HIV ANTIBODY (ROUTINE TESTING)  POC URINE PREG, ED  GC/CHLAMYDIA PROBE AMP ([place])    Imaging Review No results found.   EKG Interpretation None      MDM   Final diagnoses:  Abnormal uterine bleeding (AUB)  PID (acute pelvic inflammatory disease)  Trichomonas infection    BP 118/90 mmHg  Pulse 55  Temp(Src) 98.8 F (37.1 C) (Oral)  Resp 20  SpO2 99%  LMP 04/07/2014 (Exact Date)  I have reviewed nursing notes and vital signs.  I reviewed available ER/hospitalization records thought the EMR     Fayrene HelperBowie Braelynne Garinger, PA-C 04/14/14 1110  Samuel JesterKathleen McManus, DO 04/17/14 1346

## 2014-04-15 LAB — GC/CHLAMYDIA PROBE AMP (~~LOC~~) NOT AT ARMC
Chlamydia: NEGATIVE
Neisseria Gonorrhea: NEGATIVE

## 2014-04-15 LAB — RPR: RPR Ser Ql: NONREACTIVE

## 2014-04-15 LAB — HIV ANTIBODY (ROUTINE TESTING W REFLEX): HIV Screen 4th Generation wRfx: NONREACTIVE

## 2014-05-14 NOTE — Discharge Summary (Signed)
PATIENT NAME:  Kristen Schneider, Royann K MR#:  [id number] DATE OF BIRTH:  [birth year]  DATE OF ADMISSION:  11/02/2011 DATE OF DISCHARGE:  11/11/2011  HOSPITAL COURSE: See dictated history and physical for details of admission. This 24 year old woman with a history of polysubstance dependence was admitted to the hospital reporting that she needed help because she was out of control with abuse of multiple drugs. Current drug abuse included benzodiazepines, stimulants, alcohol, opiates. Patient has a history of bipolar disorder and had been inconsistent and noncompliant with taking medication. In the hospital she was found to not require specific detox medication. She remained physically stable throughout her hospitalization. She was started back on carbamazepine which had been effective for her as a mood stabilizer in the past. She is also taking paroxetine and temazepam to sleep. Patient was initially more irritable and dysphoric but did not engage in any dangerous behavior. She became more compliant and interactive during the hospital stay. Her mood became more upbeat and stable. She worked with the treatment team on identifying the best plan for her in going to a residential treatment facility. At this time [facility] and she will be discharged on the morning of Thursday, the 17th, with a plan to be directly transported to [facility] for admission. Patient expresses optimism and enthusiasm about the treatment plan.   DISCHARGE MEDICATIONS:  1. Tegretol 200 mg p.o. at bedtime.  2. Paxil 30 mg p.o. daily.  3. Restoril 30 mg p.o. at bedtime.   LABORATORY, DIAGNOSTIC AND RADIOLOGICAL DATA: A Tegretol level done just before discharge was elevated at 13.9. It was at this time that the dose was changed from 200 twice a day to 200 at night only. Admission labs showed essentially normal chemistries with a slightly elevated chloride at 108, slightly elevated total protein at 8.4. Alcohol  level 67. CBC normal. TSH normal at 1.3. Drug screen positive for cannabis. Urinalysis unremarkable. Pregnancy test negative.   DISCHARGE MENTAL STATUS EXAMINATION: Casually dressed, neatly groomed young woman, looks her stated age. Pleasant and cooperative in the interview. Good eye contact, normal psychomotor activity. Speech normal rate, tone, and volume. Affect euthymic, upbeat, and appropriate. Mood stated as being good. Thoughts are lucid with no indication of loosening of associations or delusional thinking. Denies auditory or visual hallucinations. Denies suicidal or homicidal ideation. Shows good insight and judgment. Normal intelligence. Good short and long term memory.   DISPOSITION: She will be discharged voluntarily with the planned transport by her family directly to [facility] for admission.   DISCHARGE DIAGNOSIS PRINCIPLE AND PRIMARY:  AXIS I: Bipolar disorder, not otherwise specified.   SECONDARY DIAGNOSES:  AXIS I: Polysubstance dependence.   AXIS II: Deferred.   AXIS III: No diagnosis.   AXIS IV: Moderate to severe stress from her own substance abuse and failure of outpatient social situation.   AXIS V: Functioning at time of discharge 61.   ____________________________ Audery AmelJohn T. Jovita Persing, MD jtc:cms D: 11/10/2011 22:37:00 ET T: 11/11/2011 11:58:20 ET JOB#: [id number]  cc: Audery AmelJohn T. Remmington Teters, MD, <Dictator> Audery AmelJOHN T Danyell Shader MD ELECTRONICALLY SIGNED 11/11/2011 15:10

## 2014-05-14 NOTE — H&P (Signed)
PATIENT NAME:  Kristen Schneider, Kristen Schneider MR#:  [id number] DATE OF BIRTH:  [birth year]  DATE OF ADMISSION:  11/02/2011  REFERRING PHYSICIAN: Dr. Daryel November   ATTENDING PHYSICIAN: Kristine Linea, MD   IDENTIFYING DATA: Kristen Schneider is a 24 year old female with history of bipolar disorder and substance abuse.   CHIEF COMPLAINT: "I need help".   HISTORY OF PRESENT ILLNESS: Kristen Schneider was using alcohol and benzodiazepines prior to admission. She believes that she took Adderall also and maybe some unknown pills. When she returned home, she was unable to walk and collapsed on the floor. She was brought to the Emergency Room. The patient reports that since last discharge from the hospital in February she has not been consistently taking medicines as she was unable to afford them. She feels that it was a bad mistake as her mood became very unstable and she started drinking excessively and using substances. This created a lot of problems within the family. She had to move several times and is currently staying with a friend of her grandmother. She does not know whether she will be allowed back there as the old lady got frightened when the patient collapsed at her house. She reports heavy drinking that is not continuous but rather episodic and abuse of pills whatever she can get. She denies steady drinking or steady benzodiazepine use and does not believe that she needs detox. She realizes that drinking that she believes stems from depression makes matters worse. She denies psychotic symptoms. She endorses symptoms of anxiety with panic attacks. She denies symptoms suggestive of bipolar mania.   PAST PSYCHIATRIC HISTORY: She was diagnosed with bipolar disorder at the age of 29 when hospitalized at [facility]. She has been inconsistent taking medications. She believes that she did well on Tegretol and wants to start it again. She has not been tried on other medications at least not  lately. She has been drinking and using substances on a regular basis. She has other psychiatric hospitalizations at [facility] for mood instability, alcohol dependence, and drug abuse.   FAMILY PSYCHIATRIC HISTORY: Father with alcoholism, mood instability, and multiple psychiatric hospitalizations.   PAST MEDICAL HISTORY: None.   ALLERGIES: No known drug allergies.   MEDICATIONS ON ADMISSION: None.   SOCIAL HISTORY: She is from [place]. She has high school education. She used to live with her father but now lives with a grandmother's friend. She has a history of DUI but denies current charges at present.   REVIEW OF SYSTEMS: CONSTITUTIONAL: No fevers or chills. No weight changes. EYES: No double or blurred vision. ENT: No hearing loss. RESPIRATORY: No shortness of breath or cough. CARDIOVASCULAR: No chest pain or orthopnea. GASTROINTESTINAL: No abdominal pain, nausea, vomiting, or diarrhea. GU: No incontinence or frequency. ENDOCRINE: No heat or cold intolerance. LYMPHATIC: No anemia or easy bruising. INTEGUMENTARY: No acne or rash. MUSCULOSKELETAL: No muscle or joint pain. NEUROLOGIC: No tingling or weakness. PSYCHIATRIC: See history of present illness for details.   PHYSICAL EXAMINATION:   VITAL SIGNS: Blood pressure 111/65, pulse 72, respirations 18, temperature 97.7.   GENERAL: This is a well developed young female in no acute distress.   HEENT: The pupils are equal, round, and reactive to light. Sclerae anicteric.   NECK: Supple. No thyromegaly.   LUNGS: Clear to auscultation. No dullness to percussion.   HEART: Regular rhythm and rate. No murmurs, rubs, or gallops.   ABDOMEN: Soft, nontender, nondistended. Positive bowel sounds.   MUSCULOSKELETAL: Normal muscle  strength in all extremities.   SKIN: No rashes or bruises.   LYMPHATIC: No cervical adenopathy.   NEUROLOGIC: Cranial nerves II through XII are intact.   LABORATORY DATA: Chemistries are  within normal limits. Blood alcohol level 0.067. LFTs within normal limits. Troponin 0.02. TSH 1.3. Urine tox screen positive for cannabinoids. CBC within normal limits. Urinalysis is not suggestive of urinary tract infection. Serum acetaminophen and salicylates are low. Urine pregnancy test is negative.   MENTAL STATUS EXAMINATION ON ADMISSION: The patient is alert and oriented to person, place, time, and situation. She is pleasant, polite, and cooperative. She is in bed and examined in the Emergency Room wearing hospital scrubs and a yellow shirt. She recognizes me from previous admissions. She maintains good eye contact. Her speech is soft. Mood is depressed with flat affect. Thought processing is logical and goal oriented. Thought content she denies suicidal or homicidal ideation but was admitted after an overdose. There are no delusions or paranoia. There are no auditory or visual hallucinations. Her cognition is grossly intact. She registers 3 out of 3 and recalls 3 out of 3 objects after five minutes. She can spell world forward and backward. She knows the current president. Her insight and judgment are questionable.   SUICIDE RISK ASSESSMENT ON ADMISSION: This is a patient with a long history of depression, anxiety, mood instability, and substance abuse who came to the hospital asking for treatment.   DIAGNOSES:  AXIS I:  1. Bipolar affective disorder, not otherwise specified.  2. Polysubstance dependence.   AXIS II: Deferred.   AXIS III: Deferred.   AXIS IV: Mental illness, treatment compliance, substance abuse, primary support, access to care, financial, housing, employment.   AXIS V: GAF 25.   PLAN: The patient was admitted to [facility] for safety, stabilization, and medication management. She was initially placed on suicide precautions and was closely monitored for any unsafe behaviors. She underwent full psychiatric and risk assessment.  She received pharmacotherapy, individual and group psychotherapy, substance abuse counseling, and support from therapeutic milieu.  1. Suicidal ideation. This has resolved. The patient is able to contract for safety.  2. Mood. We will start Tegretol for mood stabilization. She may need an antidepressant eventually. We will add Vistaril to control anxiety.   3. Substance abuse. The patient does not require detox. She declines substance abuse treatment but is very much interested in being discharged to a halfway house or residential safe house.  4. Disposition. To be established.   ____________________________ Ellin GoodieJolanta B. Jennet MaduroPucilowska, MD jbp:drc D: 11/03/2011 17:21:22 ET T: 11/03/2011 17:42:43 ET JOB#: [id number]  cc: Chattie Greeson B. Jennet MaduroPucilowska, MD, <Dictator> Shari ProwsJOLANTA B Loneta Tamplin MD ELECTRONICALLY SIGNED 11/03/2011 21:39
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
## 2014-05-23 ENCOUNTER — Emergency Department (HOSPITAL_COMMUNITY): Payer: Self-pay | Admitting: Anesthesiology

## 2014-05-23 ENCOUNTER — Emergency Department (HOSPITAL_COMMUNITY): Payer: Self-pay

## 2014-05-23 ENCOUNTER — Encounter (HOSPITAL_COMMUNITY): Payer: Self-pay | Admitting: *Deleted

## 2014-05-23 ENCOUNTER — Encounter (HOSPITAL_COMMUNITY)
Admission: EM | Disposition: A | Discharge status: Home / Self Care | Payer: Self-pay | Source: Home / Self Care | Attending: Emergency Medicine

## 2014-05-23 ENCOUNTER — Observation Stay (HOSPITAL_COMMUNITY)
Admission: EM | Admit: 2014-05-23 | Discharge: 2014-05-24 | Disposition: A | Discharge status: Home / Self Care | Payer: Self-pay | Attending: Surgery | Admitting: Surgery

## 2014-05-23 DIAGNOSIS — K3580 Unspecified acute appendicitis: Principal | ICD-10-CM | POA: Insufficient documentation

## 2014-05-23 DIAGNOSIS — R109 Unspecified abdominal pain: Secondary | ICD-10-CM

## 2014-05-23 DIAGNOSIS — R1031 Right lower quadrant pain: Secondary | ICD-10-CM

## 2014-05-23 DIAGNOSIS — T402X5A Adverse effect of other opioids, initial encounter: Secondary | ICD-10-CM | POA: Insufficient documentation

## 2014-05-23 DIAGNOSIS — L270 Generalized skin eruption due to drugs and medicaments taken internally: Secondary | ICD-10-CM | POA: Insufficient documentation

## 2014-05-23 DIAGNOSIS — K660 Peritoneal adhesions (postprocedural) (postinfection): Secondary | ICD-10-CM | POA: Insufficient documentation

## 2014-05-23 HISTORY — PX: LAPAROSCOPIC APPENDECTOMY: SHX408

## 2014-05-23 HISTORY — PX: LAPAROSCOPIC LYSIS OF ADHESIONS: SHX5905

## 2014-05-23 LAB — URINALYSIS, ROUTINE W REFLEX MICROSCOPIC
Bilirubin Urine: NEGATIVE
Glucose, UA: NEGATIVE mg/dL
KETONES UR: NEGATIVE mg/dL
LEUKOCYTES UA: NEGATIVE
NITRITE: NEGATIVE
PROTEIN: NEGATIVE mg/dL
Specific Gravity, Urine: 1.026 (ref 1.005–1.030)
Urobilinogen, UA: 1 mg/dL (ref 0.0–1.0)
pH: 5.5 (ref 5.0–8.0)

## 2014-05-23 LAB — COMPREHENSIVE METABOLIC PANEL
ALT: 14 U/L (ref 0–35)
AST: 16 U/L (ref 0–37)
Albumin: 3.3 g/dL — ABNORMAL LOW (ref 3.5–5.2)
Alkaline Phosphatase: 34 U/L — ABNORMAL LOW (ref 39–117)
Anion gap: 8 (ref 5–15)
BILIRUBIN TOTAL: 0.7 mg/dL (ref 0.3–1.2)
BUN: 13 mg/dL (ref 6–23)
CO2: 25 mmol/L (ref 19–32)
CREATININE: 0.76 mg/dL (ref 0.50–1.10)
Calcium: 8.4 mg/dL (ref 8.4–10.5)
Chloride: 104 mmol/L (ref 96–112)
GFR calc non Af Amer: >90 mL/min (ref >90)
Glucose, Bld: 91 mg/dL (ref 70–99)
Potassium: 3.6 mmol/L (ref 3.5–5.1)
Sodium: 137 mmol/L (ref 135–145)
Total Protein: 6.1 g/dL (ref 6.0–8.3)

## 2014-05-23 LAB — LIPASE, BLOOD: Lipase: 35 U/L (ref 11–59)

## 2014-05-23 LAB — WET PREP, GENITAL
Clue Cells Wet Prep HPF POC: NONE SEEN
Trich, Wet Prep: NONE SEEN
YEAST WET PREP: NONE SEEN

## 2014-05-23 LAB — URINE MICROSCOPIC-ADD ON: RBC / HPF: NONE SEEN RBC/hpf (ref <3)

## 2014-05-23 LAB — CBC WITH DIFFERENTIAL/PLATELET
BASOS ABS: 0.1 10*3/uL (ref 0.0–0.1)
BASOS PCT: 1 % (ref 0–1)
Eosinophils Absolute: 0.1 10*3/uL (ref 0.0–0.7)
Eosinophils Relative: 1 % (ref 0–5)
HEMATOCRIT: 40.2 % (ref 36.0–46.0)
Hemoglobin: 13.5 g/dL (ref 12.0–15.0)
Lymphocytes Relative: 35 % (ref 12–46)
Lymphs Abs: 3.3 10*3/uL (ref 0.7–4.0)
MCH: 31 pg (ref 26.0–34.0)
MCHC: 33.6 g/dL (ref 30.0–36.0)
MCV: 92.4 fL (ref 78.0–100.0)
MONO ABS: 0.8 10*3/uL (ref 0.1–1.0)
Monocytes Relative: 8 % (ref 3–12)
NEUTROS ABS: 5.3 10*3/uL (ref 1.7–7.7)
Neutrophils Relative %: 55 % (ref 43–77)
Platelets: 240 10*3/uL (ref 150–400)
RBC: 4.35 MIL/uL (ref 3.87–5.11)
RDW: 12.7 % (ref 11.5–15.5)
WBC: 9.6 10*3/uL (ref 4.0–10.5)

## 2014-05-23 LAB — POC URINE PREG, ED: Preg Test, Ur: NEGATIVE

## 2014-05-23 SURGERY — APPENDECTOMY, LAPAROSCOPIC
Anesthesia: General | Site: Abdomen

## 2014-05-23 MED ORDER — ALBUTEROL SULFATE HFA 108 (90 BASE) MCG/ACT IN AERS
INHALATION_SPRAY | RESPIRATORY_TRACT | Status: AC
  Filled 2014-05-23: qty 6.7

## 2014-05-23 MED ORDER — MORPHINE SULFATE 4 MG/ML IJ SOLN
4.0000 mg | Freq: Once | INTRAMUSCULAR | Status: AC
  Administered 2014-05-23: 4 mg via INTRAVENOUS
  Filled 2014-05-23: qty 1

## 2014-05-23 MED ORDER — ONDANSETRON HCL 4 MG/2ML IJ SOLN
INTRAMUSCULAR | Status: DC | PRN
  Administered 2014-05-23: 4 mg via INTRAVENOUS

## 2014-05-23 MED ORDER — FENTANYL CITRATE (PF) 250 MCG/5ML IJ SOLN
INTRAMUSCULAR | Status: AC
  Filled 2014-05-23: qty 5

## 2014-05-23 MED ORDER — GLYCOPYRROLATE 0.2 MG/ML IJ SOLN
INTRAMUSCULAR | Status: DC | PRN
Start: 2014-05-23 — End: 2014-05-23
  Administered 2014-05-23: .4 mg via INTRAVENOUS
  Administered 2014-05-23: .2 mg via INTRAVENOUS

## 2014-05-23 MED ORDER — QUETIAPINE FUMARATE 25 MG PO TABS
25.0000 mg | ORAL_TABLET | Freq: Two times a day (BID) | ORAL | Status: DC | PRN
  Filled 2014-05-23: qty 1

## 2014-05-23 MED ORDER — KCL IN DEXTROSE-NACL 20-5-0.45 MEQ/L-%-% IV SOLN
INTRAVENOUS | Status: DC
  Administered 2014-05-23: 19:00:00 via INTRAVENOUS
  Administered 2014-05-24: 1000 mL via INTRAVENOUS
  Administered 2014-05-24: 02:00:00 via INTRAVENOUS
  Filled 2014-05-23 (×5): qty 1000

## 2014-05-23 MED ORDER — MIDAZOLAM HCL 5 MG/5ML IJ SOLN
INTRAMUSCULAR | Status: DC | PRN
Start: 2014-05-23 — End: 2014-05-23
  Administered 2014-05-23: 2 mg via INTRAVENOUS

## 2014-05-23 MED ORDER — LIDOCAINE HCL 4 % MT SOLN
OROMUCOSAL | Status: DC | PRN
  Administered 2014-05-23: 4 mL via TOPICAL

## 2014-05-23 MED ORDER — OXYCODONE-ACETAMINOPHEN 5-325 MG PO TABS
1.0000 | ORAL_TABLET | ORAL | Status: DC | PRN
  Administered 2014-05-23 – 2014-05-24 (×5): 2 via ORAL
  Filled 2014-05-23 (×5): qty 2

## 2014-05-23 MED ORDER — IOHEXOL 300 MG/ML  SOLN
80.0000 mL | Freq: Once | INTRAMUSCULAR | Status: AC | PRN
  Administered 2014-05-23: 80 mL via INTRAVENOUS

## 2014-05-23 MED ORDER — MIDAZOLAM HCL 2 MG/2ML IJ SOLN
INTRAMUSCULAR | Status: AC
  Filled 2014-05-23: qty 2

## 2014-05-23 MED ORDER — NEOSTIGMINE METHYLSULFATE 10 MG/10ML IV SOLN
INTRAVENOUS | Status: AC
  Filled 2014-05-23: qty 1

## 2014-05-23 MED ORDER — DIPHENHYDRAMINE HCL 12.5 MG/5ML PO ELIX
12.5000 mg | ORAL_SOLUTION | Freq: Four times a day (QID) | ORAL | Status: DC | PRN
  Administered 2014-05-24: 25 mg via ORAL
  Filled 2014-05-23 (×2): qty 10

## 2014-05-23 MED ORDER — GLYCOPYRROLATE 0.2 MG/ML IJ SOLN
INTRAMUSCULAR | Status: AC
  Filled 2014-05-23: qty 1

## 2014-05-23 MED ORDER — DEXAMETHASONE SODIUM PHOSPHATE 4 MG/ML IJ SOLN
INTRAMUSCULAR | Status: DC | PRN
  Administered 2014-05-23: 4 mg via INTRAVENOUS

## 2014-05-23 MED ORDER — PAROXETINE HCL 10 MG PO TABS
10.0000 mg | ORAL_TABLET | Freq: Every day | ORAL | Status: DC
  Administered 2014-05-23: 10 mg via ORAL
  Filled 2014-05-23 (×2): qty 1

## 2014-05-23 MED ORDER — PROPOFOL 10 MG/ML IV BOLUS
INTRAVENOUS | Status: AC
  Filled 2014-05-23: qty 20

## 2014-05-23 MED ORDER — ENOXAPARIN SODIUM 40 MG/0.4ML ~~LOC~~ SOLN
40.0000 mg | SUBCUTANEOUS | Status: DC
  Administered 2014-05-24: 40 mg via SUBCUTANEOUS
  Filled 2014-05-23: qty 0.4

## 2014-05-23 MED ORDER — LACTATED RINGERS IV SOLN
INTRAVENOUS | Status: DC | PRN
Start: 2014-05-23 — End: 2014-05-23
  Administered 2014-05-23: 1000 mL via INTRAVENOUS
  Administered 2014-05-23: 15:00:00 via INTRAVENOUS

## 2014-05-23 MED ORDER — HYDROMORPHONE HCL 1 MG/ML IJ SOLN
INTRAMUSCULAR | Status: AC
  Administered 2014-05-23: 0.5 mg via INTRAVENOUS
  Filled 2014-05-23: qty 1

## 2014-05-23 MED ORDER — LIDOCAINE HCL (CARDIAC) 20 MG/ML IV SOLN
INTRAVENOUS | Status: DC | PRN
  Administered 2014-05-23: 100 mg via INTRAVENOUS

## 2014-05-23 MED ORDER — NEOSTIGMINE METHYLSULFATE 10 MG/10ML IV SOLN
INTRAVENOUS | Status: DC | PRN
  Administered 2014-05-23: 3 mg via INTRAVENOUS

## 2014-05-23 MED ORDER — 0.9 % SODIUM CHLORIDE (POUR BTL) OPTIME
TOPICAL | Status: DC | PRN
Start: 2014-05-23 — End: 2014-05-23
  Administered 2014-05-23: 1000 mL

## 2014-05-23 MED ORDER — SODIUM CHLORIDE 0.9 % IR SOLN
Status: DC | PRN
  Administered 2014-05-23: 1000 mL

## 2014-05-23 MED ORDER — MORPHINE SULFATE 2 MG/ML IJ SOLN
1.0000 mg | INTRAMUSCULAR | Status: DC | PRN
  Administered 2014-05-23 (×4): 4 mg via INTRAVENOUS
  Administered 2014-05-24: 3 mg via INTRAVENOUS
  Administered 2014-05-24 (×2): 4 mg via INTRAVENOUS
  Filled 2014-05-23 (×7): qty 2

## 2014-05-23 MED ORDER — ROCURONIUM BROMIDE 100 MG/10ML IV SOLN
INTRAVENOUS | Status: DC | PRN
  Administered 2014-05-23: 25 mg via INTRAVENOUS

## 2014-05-23 MED ORDER — ACETAMINOPHEN 325 MG PO TABS
650.0000 mg | ORAL_TABLET | Freq: Four times a day (QID) | ORAL | Status: DC | PRN
Start: 2014-05-23 — End: 2014-05-24

## 2014-05-23 MED ORDER — FENTANYL CITRATE (PF) 100 MCG/2ML IJ SOLN
INTRAMUSCULAR | Status: DC | PRN
  Administered 2014-05-23 (×4): 50 ug via INTRAVENOUS
  Administered 2014-05-23: 100 ug via INTRAVENOUS
  Administered 2014-05-23 (×2): 50 ug via INTRAVENOUS

## 2014-05-23 MED ORDER — BUPIVACAINE-EPINEPHRINE 0.5% -1:200000 IJ SOLN
INTRAMUSCULAR | Status: DC | PRN
  Administered 2014-05-23: 7 mL

## 2014-05-23 MED ORDER — IOHEXOL 300 MG/ML  SOLN
25.0000 mL | Freq: Once | INTRAMUSCULAR | Status: AC | PRN
  Administered 2014-05-23: 25 mL via ORAL

## 2014-05-23 MED ORDER — BUPIVACAINE-EPINEPHRINE (PF) 0.5% -1:200000 IJ SOLN
INTRAMUSCULAR | Status: AC
  Filled 2014-05-23: qty 30

## 2014-05-23 MED ORDER — HYDROMORPHONE HCL 1 MG/ML IJ SOLN
0.2500 mg | INTRAMUSCULAR | Status: DC | PRN
  Administered 2014-05-23 (×4): 0.5 mg via INTRAVENOUS

## 2014-05-23 MED ORDER — ONDANSETRON HCL 4 MG/2ML IJ SOLN
4.0000 mg | Freq: Once | INTRAMUSCULAR | Status: AC
  Administered 2014-05-23: 4 mg via INTRAVENOUS
  Filled 2014-05-23: qty 2

## 2014-05-23 MED ORDER — SUCCINYLCHOLINE CHLORIDE 20 MG/ML IJ SOLN
INTRAMUSCULAR | Status: DC | PRN
  Administered 2014-05-23: 100 mg via INTRAVENOUS
  Administered 2014-05-23: 4 mg via INTRAVENOUS

## 2014-05-23 MED ORDER — DIPHENHYDRAMINE HCL 50 MG/ML IJ SOLN
12.5000 mg | Freq: Four times a day (QID) | INTRAMUSCULAR | Status: DC | PRN
  Administered 2014-05-23 – 2014-05-24 (×3): 25 mg via INTRAVENOUS
  Filled 2014-05-23 (×3): qty 1

## 2014-05-23 MED ORDER — POLYETHYLENE GLYCOL 3350 17 G PO PACK
17.0000 g | PACK | Freq: Every day | ORAL | Status: DC | PRN
  Filled 2014-05-23: qty 1

## 2014-05-23 MED ORDER — ONDANSETRON HCL 4 MG/2ML IJ SOLN
INTRAMUSCULAR | Status: AC
  Filled 2014-05-23: qty 2

## 2014-05-23 MED ORDER — PROPOFOL 10 MG/ML IV BOLUS
INTRAVENOUS | Status: DC | PRN
Start: 2014-05-23 — End: 2014-05-23
  Administered 2014-05-23: 170 mg via INTRAVENOUS

## 2014-05-23 MED ORDER — DEXAMETHASONE SODIUM PHOSPHATE 4 MG/ML IJ SOLN
INTRAMUSCULAR | Status: AC
  Filled 2014-05-23: qty 3

## 2014-05-23 MED ORDER — ALBUTEROL SULFATE HFA 108 (90 BASE) MCG/ACT IN AERS
INHALATION_SPRAY | RESPIRATORY_TRACT | Status: DC | PRN
  Administered 2014-05-23: 4 via RESPIRATORY_TRACT

## 2014-05-23 MED ORDER — DEXTROSE 5 % IV SOLN
2.0000 g | Freq: Three times a day (TID) | INTRAVENOUS | Status: DC
  Administered 2014-05-23: 2 g via INTRAVENOUS
  Filled 2014-05-23 (×3): qty 2

## 2014-05-23 MED ORDER — ONDANSETRON HCL 4 MG/2ML IJ SOLN: 4.0000 mg | INTRAMUSCULAR | Status: DC | PRN

## 2014-05-23 MED ORDER — DEXAMETHASONE SODIUM PHOSPHATE 10 MG/ML IJ SOLN
INTRAMUSCULAR | Status: AC
  Filled 2014-05-23: qty 1

## 2014-05-23 MED ORDER — ACETAMINOPHEN 650 MG RE SUPP: 650.0000 mg | Freq: Four times a day (QID) | RECTAL | Status: DC | PRN

## 2014-05-23 SURGICAL SUPPLY — 39 items
APPLIER CLIP ROT 10 11.4 M/L (STAPLE)
BLADE SURG ROTATE 9660 (MISCELLANEOUS) IMPLANT
CANISTER SUCTION 2500CC (MISCELLANEOUS) ×3 IMPLANT
CHLORAPREP W/TINT 26ML (MISCELLANEOUS) ×3 IMPLANT
CLIP APPLIE ROT 10 11.4 M/L (STAPLE) IMPLANT
COVER SURGICAL LIGHT HANDLE (MISCELLANEOUS) ×3 IMPLANT
CUTTER FLEX LINEAR 45M (STAPLE) ×3 IMPLANT
DRAPE LAPAROSCOPIC ABDOMINAL (DRAPES) ×3 IMPLANT
DRAPE WARM FLUID 44X44 (DRAPE) ×3 IMPLANT
ELECT REM PT RETURN 9FT ADLT (ELECTROSURGICAL) ×3
ELECTRODE REM PT RTRN 9FT ADLT (ELECTROSURGICAL) ×1 IMPLANT
ENDOLOOP SUT PDS II  0 18 (SUTURE)
ENDOLOOP SUT PDS II 0 18 (SUTURE) IMPLANT
GLOVE BIO SURGEON STRL SZ8 (GLOVE) ×3 IMPLANT
GLOVE BIOGEL PI IND STRL 8 (GLOVE) ×1 IMPLANT
GLOVE BIOGEL PI INDICATOR 8 (GLOVE) ×2
GOWN STRL REUS W/ TWL LRG LVL3 (GOWN DISPOSABLE) ×2 IMPLANT
GOWN STRL REUS W/ TWL XL LVL3 (GOWN DISPOSABLE) ×1 IMPLANT
GOWN STRL REUS W/TWL LRG LVL3 (GOWN DISPOSABLE) ×4
GOWN STRL REUS W/TWL XL LVL3 (GOWN DISPOSABLE) ×2
KIT BASIN OR (CUSTOM PROCEDURE TRAY) ×3 IMPLANT
KIT ROOM TURNOVER OR (KITS) ×3 IMPLANT
LIQUID BAND (GAUZE/BANDAGES/DRESSINGS) ×3 IMPLANT
NS IRRIG 1000ML POUR BTL (IV SOLUTION) ×3 IMPLANT
PAD ARMBOARD 7.5X6 YLW CONV (MISCELLANEOUS) ×6 IMPLANT
POUCH SPECIMEN RETRIEVAL 10MM (ENDOMECHANICALS) ×3 IMPLANT
RELOAD STAPLE TA45 3.5 REG BLU (ENDOMECHANICALS) ×3 IMPLANT
SCALPEL HARMONIC ACE (MISCELLANEOUS) ×3 IMPLANT
SET IRRIG TUBING LAPAROSCOPIC (IRRIGATION / IRRIGATOR) ×3 IMPLANT
SOLUTION ANTI FOG 6CC (MISCELLANEOUS) ×3 IMPLANT
SPECIMEN JAR SMALL (MISCELLANEOUS) ×3 IMPLANT
SUT MON AB 4-0 PC3 18 (SUTURE) ×3 IMPLANT
TOWEL OR 17X24 6PK STRL BLUE (TOWEL DISPOSABLE) ×3 IMPLANT
TOWEL OR 17X26 10 PK STRL BLUE (TOWEL DISPOSABLE) ×3 IMPLANT
TRAY FOLEY CATH 16FR SILVER (SET/KITS/TRAYS/PACK) ×3 IMPLANT
TRAY LAPAROSCOPIC (CUSTOM PROCEDURE TRAY) ×3 IMPLANT
TROCAR XCEL BLADELESS 5X75MML (TROCAR) ×6 IMPLANT
TROCAR XCEL BLUNT TIP 100MML (ENDOMECHANICALS) ×3 IMPLANT
TUBING INSUFFLATION (TUBING) ×3 IMPLANT

## 2014-05-23 NOTE — Op Note (Signed)
Appendectomy, Lap, Procedure Note  Indications: The patient presented with a history of right-sided abdominal pain. A CT  revealed findings consistent with acute appendicitis.The procedure has been discussed with the patient.  Alternative therapies have been discussed with the patient.  Operative risks include bleeding,  Infection,  Organ injury,  Nerve injury,  Blood vessel injury,  DVT,  Pulmonary embolism,  Death,  And possible reoperation.  Medical management risks include worsening of present situation.  The success of the procedure is 50 -90 % at treating patients symptoms.  The patient understands and agrees to proceed.  Pre-operative Diagnosis: Acute appendicitis without mention of peritonitis  Post-operative Diagnosis: Same plus dense adhesions of terminal ileum to anterior abdominal wall  Surgeon: Reiley Keisler A.   Assistants: NONE   Anesthesia: General endotracheal anesthesia and Local anesthesia 0.25.% bupivacaine, with epinephrine  ASA Class: 1  Procedure Details  The patient was seen again in the Holding Room. The risks, benefits, complications, treatment options, and expected outcomes were discussed with the patient and/or family. The possibilities of reaction to medication, pulmonary aspiration, perforation of viscus, bleeding, recurrent infection, finding a normal appendix, the need for additional procedures, failure to diagnose a condition, and creating a complication requiring transfusion or operation were discussed. There was concurrence with the proposed plan and informed consent was obtained. The site of surgery was properly noted/marked. The patient was taken to Operating Room, identified as Kristen Schneider and the procedure verified as Appendectomy. A Time Out was held and the above information confirmed.  The patient was placed in the supine position and general anesthesia was induced, along with placement of orogastric tube, Venodyne boots, and a Foley catheter. The  abdomen was prepped and draped in a sterile fashion. A one centimeter infraumbilical incision was made and the peritoneal cavity was accessed using the OPEN  technique. The pneumoperitoneum was then established to steady pressure of 12 mmHg. A 12 mm port was placed through the umbilical incision. Additional 5 mm cannulas then placed in the left lower quadrant of the abdomenand right upper quadrant  under direct vision. A careful evaluation of the entire abdomen was carried out. The patient was placed in Trendelenburg and left lateral decubitus position. The small intestines were retracted in the cephalad and left lateral direction away from the pelvis and right lower quadrant. The patient was found to have an enlarged and inflamed appendix that was extending into the pelvis. There was no evidence of perforation. The terminal ileum was densely adherent to the anterior lateral abdominal wall and this was taken down sharply to mobilize the cecum and better expose the appendix.   The appendix was carefully dissected. A window was made in the mesoappendix at the base of the appendix. A harmonic scalpel was used across the mesoappendix. The appendix was divided at its base using an endo-GIA stapler. Minimal appendiceal stump was left in place. There was no evidence of bleeding, leakage, or complication after division of the appendix. Irrigation was also performed and irrigate suctioned from the abdomen as well.  The umbilical port site was closed using 0 vicryl pursestring sutures fashion at the level of the fascia. The trocar site skin wounds were closed using skin staples.  Instrument, sponge, and needle counts were correct at the conclusion of the case.   Findings: The appendix was found to be inflamed. There were not signs of necrosis.  There was not perforation. There was not abscess formation.  Estimated Blood Loss:  Minimal  Drains: NONE         Total IV Fluids: 500 mL         Specimens:  APPENDIX         Complications:  None; patient tolerated the procedure well.         Disposition: PACU - hemodynamically stable.         Condition: stable

## 2014-05-23 NOTE — Anesthesia Postprocedure Evaluation (Signed)
  Anesthesia Post-op Note  Patient: Kristen Schneider  Procedure(s) Performed: Procedure(s): APPENDECTOMY LAPAROSCOPIC (N/A) LAPAROSCOPIC LYSIS OF ADHESIONS (N/A)  Patient Location: PACU  Anesthesia Type:General  Level of Consciousness: awake  Airway and Oxygen Therapy: Patient Spontanous Breathing  Post-op Pain: mild  Post-op Assessment: Post-op Vital signs reviewed  Post-op Vital Signs: Reviewed  Last Vitals:  Filed Vitals:   05/23/14 1540  BP:   Pulse: 86  Temp:   Resp: 15    Complications: No apparent anesthesia complications

## 2014-05-23 NOTE — ED Notes (Signed)
Waiting for antibiotic from Pharmacy.

## 2014-05-23 NOTE — ED Notes (Signed)
Pt ambulated to restroom. No further needs at this time.

## 2014-05-23 NOTE — ED Notes (Signed)
Patient transported to CT 

## 2014-05-23 NOTE — H&P (Signed)
[facility] Surgery Admission Note  LIBERTI APPLETON 1990/08/11  [id number].    Requesting MD: Jaynie Crumble, PA-C Chief Complaint/Reason for Consult: Early acute appendicitis  HPI:  24 y/o white female with PMH Polysubstance abuse s/p rehab, sober for 4 months.   She present to [facility] with acute onset of severe RLQ abdominal pain since 4:15am.  The pain was sharp/stabbing, 6-7/10.  No radiation, no precipitating/alleviating factors.  Mild nausea, no vomiting, no change in bowel or bladder habits, no CP/SOB.  She's never had anything like this before.  No h/o gastrointestinal problems except since she was born she's had 4-5 times where they have done colonoscopies to remove polyps.  She is unsure of the reason.  Denies any abdominal surgeries in the past.  CT scan shows early tip appendicitis.  WBC is normal, gyn workup in ED is negative.     ROS: All systems reviewed and otherwise negative except for as above  Family History  Problem Relation Age of Onset  . Alcoholism Father     Past Medical History  Diagnosis Date  . Torn rotator cuff left shoulder  . Shingles 2006  . Polysubstance abuse   . Abnormal menstrual periods   . Anxiety     Past Surgical History  Procedure Laterality Date  . Tonsillectomy      24 y/o   . Wisdom tooth extraction      24 y/o   . Polypectomy      As a child multiple colonoscopy's for polyp removal    Social History:  reports that she has been smoking Cigarettes.  She has been smoking about 0.50 packs per day. She has quit using smokeless tobacco. She reports that she does not drink alcohol or use illicit drugs.  Allergies: No Known Allergies   (Not in a hospital admission)  Blood pressure 101/68, pulse 57, temperature 97.9 F (36.6 C), temperature source Oral, resp. rate 17, height 5\' 5"  (1.651 m), weight 61.689 kg (136 lb), last menstrual period 05/23/2014, SpO2 97 %. Physical Exam: General: pleasant, WD/WN white female who is  laying in bed in NAD HEENT: head is normocephalic, atraumatic.  Sclera are noninjected.  PERRL.  Ears and nose without any masses or lesions.  Mouth is pink and moist Heart: regular, rate, and rhythm.  No obvious murmurs, gallops, or rubs noted.  Palpable pedal pulses bilaterally Lungs: CTAB, no wheezes, rhonchi, or rales noted.  Respiratory effort nonlabored Abd: soft, ND, tender in the RLQ, +BS, no masses, hernias, or organomegaly, no surgical scars visable MS: all 4 extremities are symmetrical with no cyanosis, clubbing, or edema. Skin: warm and dry with no masses, lesions, or rashes Psych: A&Ox3 with an appropriate affect.   Results for orders placed or performed during the hospital encounter of 05/23/14 (from the past 48 hour(s))  CBC with Differential     Status: None   Collection Time: 05/23/14  5:48 AM  Result Value Ref Range   WBC 9.6 4.0 - 10.5 K/uL   RBC 4.35 3.87 - 5.11 MIL/uL   Hemoglobin 13.5 12.0 - 15.0 g/dL   HCT 05/25/14 59.9 - 56.6 %   MCV 92.4 78.0 - 100.0 fL   MCH 31.0 26.0 - 34.0 pg   MCHC 33.6 30.0 - 36.0 g/dL   RDW 71.7 79.5 - 64.6 %   Platelets 240 150 - 400 K/uL   Neutrophils Relative % 55 43 - 77 %   Neutro Abs 5.3 1.7 - 7.7 K/uL  Lymphocytes Relative 35 12 - 46 %   Lymphs Abs 3.3 0.7 - 4.0 K/uL   Monocytes Relative 8 3 - 12 %   Monocytes Absolute 0.8 0.1 - 1.0 K/uL   Eosinophils Relative 1 0 - 5 %   Eosinophils Absolute 0.1 0.0 - 0.7 K/uL   Basophils Relative 1 0 - 1 %   Basophils Absolute 0.1 0.0 - 0.1 K/uL  Comprehensive metabolic panel     Status: Abnormal   Collection Time: 05/23/14  5:48 AM  Result Value Ref Range   Sodium 137 135 - 145 mmol/L   Potassium 3.6 3.5 - 5.1 mmol/L   Chloride 104 96 - 112 mmol/L   CO2 25 19 - 32 mmol/L   Glucose, Bld 91 70 - 99 mg/dL   BUN 13 6 - 23 mg/dL   Creatinine, Ser 0.76 0.50 - 1.10 mg/dL   Calcium 8.4 8.4 - 10.5 mg/dL   Total Protein 6.1 6.0 - 8.3 g/dL   Albumin 3.3 (L) 3.5 - 5.2 g/dL   AST 16 0 - 37 U/L    ALT 14 0 - 35 U/L   Alkaline Phosphatase 34 (L) 39 - 117 U/L   Total Bilirubin 0.7 0.3 - 1.2 mg/dL   GFR calc non Af Amer >90 >90 mL/min   GFR calc Af Amer >90 >90 mL/min    Comment: (NOTE) The eGFR has been calculated using the CKD EPI equation. This calculation has not been validated in all clinical situations. eGFR's persistently <90 mL/min signify possible Chronic Kidney Disease.    Anion gap 8 5 - 15  Lipase, blood     Status: None   Collection Time: 05/23/14  5:48 AM  Result Value Ref Range   Lipase 35 11 - 59 U/L  Urinalysis, Routine w reflex microscopic     Status: Abnormal   Collection Time: 05/23/14  6:00 AM  Result Value Ref Range   Color, Urine YELLOW YELLOW   APPearance HAZY (A) CLEAR   Specific Gravity, Urine 1.026 1.005 - 1.030   pH 5.5 5.0 - 8.0   Glucose, UA NEGATIVE NEGATIVE mg/dL   Hgb urine dipstick SMALL (A) NEGATIVE   Bilirubin Urine NEGATIVE NEGATIVE   Ketones, ur NEGATIVE NEGATIVE mg/dL   Protein, ur NEGATIVE NEGATIVE mg/dL   Urobilinogen, UA 1.0 0.0 - 1.0 mg/dL   Nitrite NEGATIVE NEGATIVE   Leukocytes, UA NEGATIVE NEGATIVE  Urine microscopic-add on     Status: Abnormal   Collection Time: 05/23/14  6:00 AM  Result Value Ref Range   Squamous Epithelial / LPF FEW (A) RARE   WBC, UA 3-6 <3 WBC/hpf   RBC / HPF  <3 RBC/hpf    NO FORMED ELEMENTS SEEN ON URINE MICROSCOPIC EXAMINATION   Bacteria, UA FEW (A) RARE   Urine-Other MUCOUS PRESENT   POC Urine Pregnancy, ED  (If Pre-menopausal female) - do not order at [facility]     Status: None   Collection Time: 05/23/14  6:37 AM  Result Value Ref Range   Preg Test, Ur NEGATIVE NEGATIVE    Comment:        THE SENSITIVITY OF THIS METHODOLOGY IS >24 mIU/mL   Wet prep, genital     Status: Abnormal   Collection Time: 05/23/14  7:18 AM  Result Value Ref Range   Yeast Wet Prep HPF POC NONE SEEN NONE SEEN   Trich, Wet Prep NONE SEEN NONE SEEN   Clue Cells Wet Prep HPF POC NONE SEEN NONE SEEN  WBC, Wet Prep HPF POC  FEW (A) NONE SEEN   US Transvaginal Non-ob  05/23/2014   CLINICAL DATA:  Right lower quadrant and pelvic pain for several hours  EXAM: TRANSABDOMINAL AND TRANSVAGINAL ULTRASOUND OF PELVIS  TECHNIQUE: Study was performed transabdominally to optimize pelvic field of view evaluation and transvaginally to optimize internal visceral architecture evaluation.  COMPARISON:  July 14, 2013  FINDINGS: Uterus  Measurements: 8.3 x 3.4 x 3.9 cm. No fibroids or other mass visualized.  Endometrium  Thickness: 3 mm.  No focal abnormality visualized.  Right ovary  Measurements: 2.2 x 2.2 x 2.9 cm. Normal appearance/no adnexal mass.  Left ovary  Measurements: 2.2 x 1.7 x 2.2 cm. Normal appearance/no adnexal mass.  Other findings  No free fluid.  IMPRESSION: Study within normal limits.   Electronically Signed   By: Lowella Grip III M.D.   On: 05/23/2014 08:29   US Pelvis Complete  05/23/2014   CLINICAL DATA:  Right lower quadrant and pelvic pain for several hours  EXAM: TRANSABDOMINAL AND TRANSVAGINAL ULTRASOUND OF PELVIS  TECHNIQUE: Study was performed transabdominally to optimize pelvic field of view evaluation and transvaginally to optimize internal visceral architecture evaluation.  COMPARISON:  July 14, 2013  FINDINGS: Uterus  Measurements: 8.3 x 3.4 x 3.9 cm. No fibroids or other mass visualized.  Endometrium  Thickness: 3 mm.  No focal abnormality visualized.  Right ovary  Measurements: 2.2 x 2.2 x 2.9 cm. Normal appearance/no adnexal mass.  Left ovary  Measurements: 2.2 x 1.7 x 2.2 cm. Normal appearance/no adnexal mass.  Other findings  No free fluid.  IMPRESSION: Study within normal limits.   Electronically Signed   By: Lowella Grip III M.D.   On: 05/23/2014 08:29   Ct Abdomen Pelvis W Contrast  05/23/2014   CLINICAL DATA:  Approximately 7 hours of right lower quadrant pain. Nausea.  EXAM: CT ABDOMEN AND PELVIS WITH CONTRAST  TECHNIQUE: Multidetector CT imaging of the abdomen and pelvis was performed using  the standard protocol following bolus administration of intravenous contrast. Oral contrast was also administered.  CONTRAST:  80mL OMNIPAQUE IOHEXOL 300 MG/ML  SOLN  COMPARISON:  None.  FINDINGS: Lung bases are clear.  Liver is prominent, measuring 19.6 cm in length. No focal liver lesions are identified. Gallbladder wall is not thickened. There is no biliary duct dilatation.  Spleen, pancreas, and adrenals appear normal. Kidneys bilaterally show no mass or hydronephrosis on either side. There is no renal or ureteral calculus on either side.  In the pelvis, the urinary bladder wall is not thickened. There is no pelvic mass or pelvic fluid collection.  Most of the appendix appears normal in size and contour. In the region of the distal aspect of the appendix, there is mild thickening which could indicate the earliest changes of appendiceal inflammation involving the distal most aspect of the appendix. This finding is best seen on axial slice 58 series 2 and coronal slice 43 series 5. There is no periappendiceal fluid or air.  There is no bowel obstruction. No free air or portal venous air. There is no ascites, adenopathy, or abscess in the abdomen or pelvis. Aorta shows no evidence of aneurysm. There is a retro aortic left renal vein, an anatomic variant. There are no blastic or lytic bone lesions.  IMPRESSION: Most the appendix appears normal. There is some mild thickening in the region of the distal appendix which could indicate early inflammation in this area. This study must be regarded as somewhat  equivocal for early appendiceal inflammation. Close clinical and laboratory surveillance advised. If symptoms persist, repeat imaging with particular attention to the upper pelvis on the right may be reasonable.  No bowel obstruction. No abscess. No renal or ureteral calculus. No hydronephrosis.  Liver prominent without focal lesion.  Critical Value/emergent results were called by telephone at the time of  interpretation on 05/23/2014 at 10:51 am to Malvin Johns, MD, who verbally acknowledged these results.   Electronically Signed   By: Lowella Grip III M.D.   On: 05/23/2014 10:53      Assessment/Plan Early tip appendicitis RLQ abdominal pain -Admit to [facility], urgent lap appy by Dr. Brantley Stage or Dr. Georgette Dover -NPO, bowel rest, IVF, pain control, antiemetics, antibiotics (cefoxitin) -Discussed post-op course, limitations and recovery -She works as a Scientist, water quality at a [employer]  H/o substance abuse - sober for 4 mo H/o colonoscopies for polyp removal - most recent at 24 y/o  [place], [place], [employer] [facility] 05/23/2014, 11:46 AM Pager: [phone]

## 2014-05-23 NOTE — ED Notes (Signed)
Spoke with Janelle from CT. Pt finished contrast and ready for CT.

## 2014-05-23 NOTE — ED Notes (Signed)
Pt. Describes 7/10 sharp abd. Pain in the lower right quadrant that started at 4am. Pt. Still has appendix

## 2014-05-23 NOTE — ED Notes (Signed)
Pelvic exam performed by PA. Waiting for ultrasound at this time.

## 2014-05-23 NOTE — Anesthesia Postprocedure Evaluation (Signed)
  Anesthesia Post-op Note  Patient: Kristen Schneider  Procedure(s) Performed: Procedure(s): APPENDECTOMY LAPAROSCOPIC (N/A) LAPAROSCOPIC LYSIS OF ADHESIONS (N/A)  Patient Location: PACU  Anesthesia Type:General  Level of Consciousness: awake  Airway and Oxygen Therapy: Patient Spontanous Breathing  Post-op Pain: mild  Post-op Assessment: Post-op Vital signs reviewed  Post-op Vital Signs: Reviewed  Last Vitals:  Filed Vitals:   05/23/14 1540  BP:   Pulse: 86  Temp:   Resp: 15    Complications: No apparent anesthesia complications 

## 2014-05-23 NOTE — ED Provider Notes (Signed)
CSN: [id number]     Arrival date & time 05/23/14  0534 History   First MD Initiated Contact with Patient 05/23/14 [phone]     Chief Complaint  Patient presents with  . Abdominal Pain     (Consider location/radiation/quality/duration/timing/severity/associated sxs/prior Treatment) HPI Kristen Schneider is a 24 y.o. female history of irregular menstrual cycles, presents to emergency department complaining of sudden onset of right lower quadrant abdominal pain. Patient states the pain woke her up from sleep at 4 AM this morning. States pain is sharp. It does not radiate. She states that she is having irregular periods, states she had a cycle one week ago and it started again today. She denies any urinary symptoms. No abnormal vaginal discharge. Denies any nausea or vomiting. No fever or chills. The history of similar pain in the past. She states walking, palpating her abdomen makes pain worse, nothing makes it better. She did not try any medications before coming to emergency department. No prior abdominal surgeries. Last bowel movement yesterday.  Past Medical History  Diagnosis Date  . Torn rotator cuff left shoulder  . Shingles 2006  . Polysubstance abuse   . Abnormal menstrual periods   . Anxiety    Past Surgical History  Procedure Laterality Date  . Tonsillectomy    . Wisdom tooth extraction    . Polypectomy    . Other surgical history      Intestinal polyps as a child   Family History  Problem Relation Age of Onset  . Alcoholism Father    History  Substance Use Topics  . Smoking status: Current Every Day Smoker -- 0.50 packs/day    Types: Cigarettes  . Smokeless tobacco: Former Neurosurgeon  . Alcohol Use: No   OB History    No data available     Review of Systems  Constitutional: Negative for fever and chills.  Respiratory: Negative for cough, chest tightness and shortness of breath.   Cardiovascular: Negative for chest pain, palpitations and leg swelling.  Gastrointestinal:  Positive for abdominal pain. Negative for nausea, vomiting and diarrhea.  Genitourinary: Positive for vaginal bleeding. Negative for dysuria, flank pain, vaginal discharge, vaginal pain and pelvic pain.  Musculoskeletal: Negative for myalgias, arthralgias, neck pain and neck stiffness.  Skin: Negative for rash.  Neurological: Negative for dizziness, weakness and headaches.  All other systems reviewed and are negative.     Allergies  Review of patient's allergies indicates no known allergies.  Home Medications   Prior to Admission medications   Medication Sig Start Date End Date Taking? Authorizing Provider  naltrexone (DEPADE) 50 MG tablet Take 0.5 tablets (25 mg total) by mouth daily. For alcoholism.opioid dependence 02/04/14  Yes Sanjuana Kava, NP  PARoxetine (PAXIL) 10 MG tablet Take 1 tablet (10 mg total) by mouth at bedtime. For depression 02/04/14  Yes Sanjuana Kava, NP  QUEtiapine (SEROQUEL) 25 MG tablet Take 1 tablet (25 mg total) by mouth 2 (two) times daily as needed (Anxiety). 02/04/14  Yes Sanjuana Kava, NP  doxycycline (VIBRAMYCIN) 100 MG capsule Take 1 capsule (100 mg total) by mouth 2 (two) times daily. One po bid x 14 days Patient not taking: Reported on 05/23/2014 04/14/14   Fayrene Helper, PA-C  gabapentin (NEURONTIN) 300 MG capsule Take 1 capsule (300 mg total) by mouth 3 (three) times daily. For agitation/pain management Patient not taking: Reported on 05/23/2014 02/04/14   Sanjuana Kava, NP  lidocaine (LIDODERM) 5 % Place 1 patch onto the skin  daily. Remove & Discard patch within 12 hours or as directed by MD: For pain Patient not taking: Reported on 05/23/2014 02/04/14   Sanjuana KavaAgnes I Nwoko, NP  traZODone (DESYREL) 50 MG tablet Take 1 tablet (50 mg total) by mouth at bedtime and may repeat dose one time if needed. For sleep Patient not taking: Reported on 05/23/2014 02/04/14   Sanjuana KavaAgnes I Nwoko, NP   BP 123/83 mmHg  Pulse 60  Temp(Src) 97.9 F (36.6 C) (Oral)  Resp 16  Ht 5\' 5"  (1.651  m)  Wt 136 lb (61.689 kg)  BMI 22.63 kg/m2  SpO2 98%  LMP 05/23/2014 Physical Exam  Constitutional: She appears well-developed and well-nourished. No distress.  HENT:  Head: Normocephalic.  Eyes: Conjunctivae are normal.  Neck: Neck supple.  Cardiovascular: Normal rate, regular rhythm and normal heart sounds.   Pulmonary/Chest: Effort normal and breath sounds normal. No respiratory distress. She has no wheezes. She has no rales.  Abdominal: Soft. Bowel sounds are normal. She exhibits no distension. There is tenderness. There is no rebound and no guarding.  RLQ tenderness  Musculoskeletal: She exhibits no edema.  Neurological: She is alert.  Skin: Skin is warm and dry.  Psychiatric: She has a normal mood and affect. Her behavior is normal.  Nursing note and vitals reviewed.   ED Course  Procedures (including critical care time) Labs Review Labs Reviewed  WET PREP, GENITAL  CBC WITH DIFFERENTIAL/PLATELET  COMPREHENSIVE METABOLIC PANEL  LIPASE, BLOOD  URINALYSIS, ROUTINE W REFLEX MICROSCOPIC  POC URINE PREG, ED  GC/CHLAMYDIA PROBE AMP ([place])    Imaging Review No results found.   EKG Interpretation None      MDM   Final diagnoses:  Abdominal pain in female patient    Patient with right lower quadrant abdominal pain, no rebound tenderness, no guarding. She seems to be very uncomfortable however. Will do pelvic exam, pain medications ordered. Differential includes ovarian pathology, PID, appendicitis.  11:20 AM Spoke with general surgery regarding CT finding. Will come by and seen her.   Jaynie Crumbleatyana Sayvion Vigen, PA-C 05/24/14 2223  Tomasita CrumbleAdeleke Oni, MD 05/25/14 [phone]

## 2014-05-23 NOTE — ED Notes (Signed)
Patient transported to Ultrasound 

## 2014-05-23 NOTE — Anesthesia Procedure Notes (Signed)
Procedure Name: Intubation Date/Time: 05/23/2014 1:39 PM Performed by: Daiva EvesAVENEL, Herchel Hopkin W Pre-anesthesia Checklist: Patient identified, Emergency Drugs available, Suction available, Patient being monitored and Timeout performed Patient Re-evaluated:Patient Re-evaluated prior to inductionOxygen Delivery Method: Circle system utilized Preoxygenation: Pre-oxygenation with 100% oxygen Intubation Type: IV induction Ventilation: Mask ventilation without difficulty Laryngoscope Size: Mac and 3 Grade View: Grade I Tube type: Oral Tube size: 7.0 mm Number of attempts: 1 Airway Equipment and Method: Stylet Placement Confirmation: ETT inserted through vocal cords under direct vision,  positive ETCO2 and breath sounds checked- equal and bilateral Secured at: 21 cm Tube secured with: Tape Dental Injury: Teeth and Oropharynx as per pre-operative assessment

## 2014-05-23 NOTE — Transfer of Care (Signed)
Immediate Anesthesia Transfer of Care Note  Patient: Kristen Schneider  Procedure(s) Performed: Procedure(s): APPENDECTOMY LAPAROSCOPIC (N/A) LAPAROSCOPIC LYSIS OF ADHESIONS (N/A)  Patient Location: PACU  Anesthesia Type:General  Level of Consciousness: awake, alert  and oriented  Airway & Oxygen Therapy: Patient Spontanous Breathing  Post-op Assessment: Report given to RN and Post -op Vital signs reviewed and stable  Post vital signs: Reviewed and stable  Last Vitals:  Filed Vitals:   05/23/14 1457  BP:   Pulse:   Temp: 36 C  Resp:     Complications: No apparent anesthesia complications   Pt continuously rubbing eyes despite circulator RN, PACU RN and CRNA directing pt not to rub eyes. VSS.

## 2014-05-23 NOTE — Anesthesia Preprocedure Evaluation (Addendum)
Anesthesia Evaluation  Patient identified by MRN, date of birth, ID band Patient awake    Reviewed: Allergy & Precautions, NPO status , Patient's Chart, lab work & pertinent test results  Airway Mallampati: II   Neck ROM: Full    Dental  (+) Teeth Intact, Dental Advisory Given   Pulmonary Current Smoker,  breath sounds clear to auscultation        Cardiovascular Rhythm:Regular Rate:Normal     Neuro/Psych    GI/Hepatic negative GI ROS, Neg liver ROS,   Endo/Other  negative endocrine ROS  Renal/GU negative Renal ROS     Musculoskeletal   Abdominal   Peds  Hematology   Anesthesia Other Findings   Reproductive/Obstetrics                           Anesthesia Physical Anesthesia Plan  ASA: I and emergent  Anesthesia Plan: General   Post-op Pain Management:    Induction: Intravenous, Rapid sequence and Cricoid pressure planned  Airway Management Planned: Oral ETT  Additional Equipment:   Intra-op Plan:   Post-operative Plan: Extubation in OR  Informed Consent: I have reviewed the patients History and Physical, chart, labs and discussed the procedure including the risks, benefits and alternatives for the proposed anesthesia with the patient or authorized representative who has indicated his/her understanding and acceptance.     Plan Discussed with: CRNA  Anesthesia Plan Comments:        Anesthesia Quick Evaluation

## 2014-05-24 ENCOUNTER — Encounter (HOSPITAL_COMMUNITY): Payer: Self-pay | Admitting: Surgery

## 2014-05-24 LAB — GC/CHLAMYDIA PROBE AMP (~~LOC~~) NOT AT ARMC
CHLAMYDIA, DNA PROBE: NEGATIVE
Neisseria Gonorrhea: NEGATIVE

## 2014-05-24 MED ORDER — METHOCARBAMOL 750 MG PO TABS
750.0000 mg | ORAL_TABLET | Freq: Three times a day (TID) | ORAL | Status: DC | PRN
  Filled 2014-05-24: qty 1

## 2014-05-24 MED ORDER — OXYCODONE-ACETAMINOPHEN 5-325 MG PO TABS: 1.0000 | ORAL_TABLET | Freq: Four times a day (QID) | ORAL | Status: DC | PRN

## 2014-05-24 MED ORDER — KETOROLAC TROMETHAMINE 30 MG/ML IJ SOLN
30.0000 mg | Freq: Once | INTRAMUSCULAR | Status: AC
  Administered 2014-05-24: 30 mg via INTRAVENOUS
  Filled 2014-05-24: qty 1

## 2014-05-24 MED ORDER — METHYLPREDNISOLONE SODIUM SUCC 125 MG IJ SOLR
125.0000 mg | Freq: Once | INTRAMUSCULAR | Status: AC
  Administered 2014-05-24: 125 mg via INTRAVENOUS
  Filled 2014-05-24: qty 2

## 2014-05-24 MED ORDER — POLYETHYLENE GLYCOL 3350 17 G PO PACK: 17.0000 g | PACK | Freq: Every day | ORAL | Status: DC | PRN

## 2014-05-24 MED ORDER — DOCUSATE SODIUM 100 MG PO CAPS
100.0000 mg | ORAL_CAPSULE | Freq: Two times a day (BID) | ORAL | Status: DC
  Administered 2014-05-24: 100 mg via ORAL
  Filled 2014-05-24: qty 1

## 2014-05-24 MED ORDER — DOCUSATE SODIUM 100 MG PO CAPS: 100.0000 mg | ORAL_CAPSULE | Freq: Two times a day (BID) | ORAL | Status: DC

## 2014-05-24 NOTE — Discharge Instructions (Signed)

## 2014-05-24 NOTE — Progress Notes (Signed)
UR completed 

## 2014-05-24 NOTE — Progress Notes (Signed)
[facility] Progress Note  1 Day Post-Op  Subjective: Pt has a lot of pain over incision sites.  She denies N/V, tolerating diet this am.  Ambulating well OOB, no flatus or BM yet.  Says she had a reaction to the dilaudid, she broke out in a rash on her chest and upper back an face.  She c/o itching.    Objective: Vital signs in last 24 hours: Temp:  [96.8 F (36 C)-98.4 F (36.9 C)] 98.4 F (36.9 C) (04/29 0505) Pulse Rate:  [48-98] 48 (04/29 0505) Resp:  [10-32] 20 (04/29 0505) BP: (104-157)/(61-89) 112/71 mmHg (04/29 0505) SpO2:  [89 %-99 %] 92 % (04/29 0505) Last BM Date: 05/22/14  Intake/Output from previous day: 04/28 0701 - 04/29 0700 In: 1000 [I.V.:1000] Out: 215 [Urine:200; Blood:15] Intake/Output this shift:    PE: Gen:  Alert, NAD, pleasant Pulm:  Unlabored, room air Abd: Soft, NT/ND, +BS, no HSM, incisions C/D/I, drain with minimal sanguinous drainage, no abdominal scars noted   Lab Results:   Recent Labs  05/23/14 0548  WBC 9.6  HGB 13.5  HCT 40.2  PLT 240   BMET  Recent Labs  05/23/14 0548  NA 137  K 3.6  CL 104  CO2 25  GLUCOSE 91  BUN 13  CREATININE 0.76  CALCIUM 8.4   PT/INR No results for input(s): LABPROT, INR in the last 72 hours. CMP     Component Value Date/Time   NA 137 05/23/2014 0548   NA 145 11/01/2011 2116   K 3.6 05/23/2014 0548   K 3.8 11/01/2011 2116   CL 104 05/23/2014 0548   CL 108* 11/01/2011 2116   CO2 25 05/23/2014 0548   CO2 28 11/01/2011 2116   GLUCOSE 91 05/23/2014 0548   GLUCOSE 86 11/01/2011 2116   BUN 13 05/23/2014 0548   BUN 12 11/01/2011 2116   CREATININE 0.76 05/23/2014 0548   CREATININE 0.66 11/01/2011 2116   CALCIUM 8.4 05/23/2014 0548   CALCIUM 8.7 11/01/2011 2116   PROT 6.1 05/23/2014 0548   PROT 8.4* 11/01/2011 2116   ALBUMIN 3.3* 05/23/2014 0548   ALBUMIN 4.5 11/01/2011 2116   AST 16 05/23/2014 0548   AST 15 11/01/2011 2116   ALT 14 05/23/2014 0548   ALT 16 11/01/2011  2116   ALKPHOS 34* 05/23/2014 0548   ALKPHOS 76 11/01/2011 2116   BILITOT 0.7 05/23/2014 0548   GFRNONAA >90 05/23/2014 0548   GFRNONAA >60 11/01/2011 2116   GFRAA >90 05/23/2014 0548   GFRAA >60 11/01/2011 2116   Lipase     Component Value Date/Time   LIPASE 35 05/23/2014 0548       Studies/Results: US Transvaginal Non-ob  05/23/2014   CLINICAL DATA:  Right lower quadrant and pelvic pain for several hours  EXAM: TRANSABDOMINAL AND TRANSVAGINAL ULTRASOUND OF PELVIS  TECHNIQUE: Study was performed transabdominally to optimize pelvic field of view evaluation and transvaginally to optimize internal visceral architecture evaluation.  COMPARISON:  July 14, 2013  FINDINGS: Uterus  Measurements: 8.3 x 3.4 x 3.9 cm. No fibroids or other mass visualized.  Endometrium  Thickness: 3 mm.  No focal abnormality visualized.  Right ovary  Measurements: 2.2 x 2.2 x 2.9 cm. Normal appearance/no adnexal mass.  Left ovary  Measurements: 2.2 x 1.7 x 2.2 cm. Normal appearance/no adnexal mass.  Other findings  No free fluid.  IMPRESSION: Study within normal limits.   Electronically Signed   By: Bretta Bang III M.D.   On:  05/23/2014 08:29   US Pelvis Complete  05/23/2014   CLINICAL DATA:  Right lower quadrant and pelvic pain for several hours  EXAM: TRANSABDOMINAL AND TRANSVAGINAL ULTRASOUND OF PELVIS  TECHNIQUE: Study was performed transabdominally to optimize pelvic field of view evaluation and transvaginally to optimize internal visceral architecture evaluation.  COMPARISON:  July 14, 2013  FINDINGS: Uterus  Measurements: 8.3 x 3.4 x 3.9 cm. No fibroids or other mass visualized.  Endometrium  Thickness: 3 mm.  No focal abnormality visualized.  Right ovary  Measurements: 2.2 x 2.2 x 2.9 cm. Normal appearance/no adnexal mass.  Left ovary  Measurements: 2.2 x 1.7 x 2.2 cm. Normal appearance/no adnexal mass.  Other findings  No free fluid.  IMPRESSION: Study within normal limits.   Electronically Signed   By:  Bretta Bang III M.D.   On: 05/23/2014 08:29   Ct Abdomen Pelvis W Contrast  05/23/2014   CLINICAL DATA:  Approximately 7 hours of right lower quadrant pain. Nausea.  EXAM: CT ABDOMEN AND PELVIS WITH CONTRAST  TECHNIQUE: Multidetector CT imaging of the abdomen and pelvis was performed using the standard protocol following bolus administration of intravenous contrast. Oral contrast was also administered.  CONTRAST:  80mL OMNIPAQUE IOHEXOL 300 MG/ML  SOLN  COMPARISON:  None.  FINDINGS: Lung bases are clear.  Liver is prominent, measuring 19.6 cm in length. No focal liver lesions are identified. Gallbladder wall is not thickened. There is no biliary duct dilatation.  Spleen, pancreas, and adrenals appear normal. Kidneys bilaterally show no mass or hydronephrosis on either side. There is no renal or ureteral calculus on either side.  In the pelvis, the urinary bladder wall is not thickened. There is no pelvic mass or pelvic fluid collection.  Most of the appendix appears normal in size and contour. In the region of the distal aspect of the appendix, there is mild thickening which could indicate the earliest changes of appendiceal inflammation involving the distal most aspect of the appendix. This finding is best seen on axial slice 58 series 2 and coronal slice 43 series 5. There is no periappendiceal fluid or air.  There is no bowel obstruction. No free air or portal venous air. There is no ascites, adenopathy, or abscess in the abdomen or pelvis. Aorta shows no evidence of aneurysm. There is a retro aortic left renal vein, an anatomic variant. There are no blastic or lytic bone lesions.  IMPRESSION: Most the appendix appears normal. There is some mild thickening in the region of the distal appendix which could indicate early inflammation in this area. This study must be regarded as somewhat equivocal for early appendiceal inflammation. Close clinical and laboratory surveillance advised. If symptoms persist,  repeat imaging with particular attention to the upper pelvis on the right may be reasonable.  No bowel obstruction. No abscess. No renal or ureteral calculus. No hydronephrosis.  Liver prominent without focal lesion.  Critical Value/emergent results were called by telephone at the time of interpretation on 05/23/2014 at 10:51 am to Rolan Bucco, MD, who verbally acknowledged these results.   Electronically Signed   By: Bretta Bang III M.D.   On: 05/23/2014 10:53    Anti-infectives: Anti-infectives    Start     Dose/Rate Route Frequency Ordered Stop   05/23/14 1215  cefOXitin (MEFOXIN) 2 g in dextrose 5 % 50 mL IVPB  Status:  Discontinued     2 g 100 mL/hr over 30 Minutes Intravenous 3 times per day 05/23/14 1140 05/23/14 1721  Assessment/Plan Acute appendicitis without mention of peritonitis POD #1 s /p lap chole with LOA Terminal ileum/abdominal wall adhesions -IVF, pain control, antiemetics -D/c IV antibiotics -Ambulate and IS -SCD's and lovenox -Switch to percocet for pain control, add one dose of toradol -If improved may be able to go home this afternoon vs tomorrow am  Rash/itching to dilaudid -D/c dilaudid and start benadryl and also given solumdedrol      Kristen Schneider, Kristen Schneider 05/24/2014, 11:02 AM Pager: [phone]

## 2014-05-24 NOTE — Discharge Summary (Signed)
Patient ID: Darral DashJessica K Schneider MRN: [id number] DOB/AGE: Jan 05, 1991 23 y.o.  Admit date: 05/23/2014 Discharge date: 05/24/2014  Procedures: lap appy by Dr. Harriette Bouillonhomas Cornett 05-23-14  Consults: None  Reason for Admission: 24 y/o white female with PMH Polysubstance abuse s/p rehab, sober for 4 months. [facility] acute onset of severe RLQ abdominal pain since 4:15am. The pain was sharp/stabbing, 6-7/10. No radiation, no precipitating/alleviating factors. Mild nausea, no vomiting, no change in bowel or bladder habits, no CP/SOB. She's never had anything like this before. No h/o gastrointestinal problems except since she was born she's had 4-5 times where they have done colonoscopies to remove polyps. She is unsure of the reason. Denies any abdominal surgeries in the past. CT scan shows early tip appendicitis. WBC is normal, gyn workup in ED is negative.   Admission Diagnoses:  1. Early tip appendicitis 2. H/o substance abuse   Hospital Course: The patient was admitted and taken to the OR where she underwent a lap appy.  She tolerated the procedure well.  She was tolerating a regular diet and oral pain meds on POD 1.  She did however have an allergic reaction to either Dilaudid or Morphine.  These were stopped.  She was given benadryl initially with little help.  She was given a one time dose of solu-medrol, which helped.  She was eager to go home later on POD 1.  Discharge Diagnoses:  Active Problems:   Acute appendicitis s/p lap appy Allergic reaction  Discharge Medications:   Medication List    TAKE these medications        docusate sodium 100 MG capsule  Commonly known as:  COLACE  Take 1 capsule (100 mg total) by mouth 2 (two) times daily.     naltrexone 50 MG tablet  Commonly known as:  DEPADE  Take 0.5 tablets (25 mg total) by mouth daily. For alcoholism.opioid dependence     oxyCODONE-acetaminophen 5-325 MG per tablet  Commonly known as:  PERCOCET/ROXICET   Take 1-2 tablets by mouth every 6 (six) hours as needed for moderate pain.     PARoxetine 10 MG tablet  Commonly known as:  PAXIL  Take 1 tablet (10 mg total) by mouth at bedtime. For depression     polyethylene glycol packet  Commonly known as:  MIRALAX / GLYCOLAX  Take 17 g by mouth daily as needed for mild constipation.     QUEtiapine 25 MG tablet  Commonly known as:  SEROQUEL  Take 1 tablet (25 mg total) by mouth 2 (two) times daily as needed (Anxiety).        Discharge Instructions:     Follow-up Information    [facility]     On 05/30/2014.   Why:  10 am for hospital fu   Contact information:   [address] [place] [id number] [phone]      Follow up with [facility] [place] On 06/11/2014.   Why:  For post-operation check at 3:00pm, please arrive by 2:30pm to check in and fill out paperwork.   Contact information:   [address] [place] [phone]      Signed: Letha CapeOSBORNE,Wymon Swaney E 05/24/2014, 4:27 PM

## 2014-05-24 NOTE — Care Management Note (Unsigned)
    Page 1 of 1   05/24/2014     3:24:05 PM CARE MANAGEMENT NOTE 05/24/2014  Patient:  Kristen Schneider,Kristen Schneider   Account Number:  [id number]  Date Initiated:  05/24/2014  Documentation initiated by:  Letha CapeAYLOR,Lovely Kerins  Subjective/Objective Assessment:   dx acute appendicitis  admit- lives with boyfriend. indep.     Action/Plan:   Anticipated DC Date:  05/24/2014   Anticipated DC Plan:  HOME/SELF CARE      DC Planning Services  CM consult  Follow-up appt scheduled      Choice offered to / List presented to:             Status of service:  In process, will continue to follow Medicare Important Message given?  NO (If response is "NO", the following Medicare IM given date fields will be blank) Date Medicare IM given:   Medicare IM given by:   Date Additional Medicare IM given:   Additional Medicare IM given by:    Discharge Disposition:  HOME/SELF CARE  Per UR Regulation:  Reviewed for med. necessity/level of care/duration of stay  If discussed at Long Length of Stay Meetings, dates discussed:    Comments:  05/24/14 1522 Letha Capeeborah Charidy Cappelletti RN, BSN [phone] NCM scheduled patient a hospital [facility], and informed patient and RN that clinic closes at 6 pm today if patient is dc and need to go there to get meds she needs to be dc before clinic closes and thery are not open on the weekends.

## 2014-05-24 NOTE — Progress Notes (Signed)
Pt. Continues to c/o itching all over, mostly in face where rash is, also c/o eyes burning.  Benadryl was last given at 6am this morning.  Encouraged pt. To not to scratch her face.  Paged Michael BostonKelli Osborne, PA with surgery.  Will await for return call and continue to monitor.  Forbes Cellarelcine Ibrohim Simmers, RN

## 2014-05-24 NOTE — Progress Notes (Signed)
Pt found to have some redness and itchiness on face during night. Continued to worsen around eyes and chin this morning. Benadryl was given earlier in night, but was too early to give this morning. MD Cornett was paged about allergic reaction. Was given permission verbally to give Benadryl early. Will continue to monitor.

## 2014-05-24 NOTE — Plan of Care (Signed)
Problem: Phase I Progression Outcomes Goal: Initial discharge plan identified Outcome: Completed/Met Date Met:  05/24/14 To return home with boyfriend

## 2014-05-29 ENCOUNTER — Encounter (HOSPITAL_COMMUNITY): Payer: Self-pay | Admitting: Emergency Medicine

## 2014-05-29 ENCOUNTER — Emergency Department (HOSPITAL_COMMUNITY): Payer: Self-pay

## 2014-05-29 ENCOUNTER — Emergency Department (HOSPITAL_COMMUNITY)
Admission: EM | Admit: 2014-05-29 | Discharge: 2014-05-29 | Disposition: A | Discharge status: Home / Self Care | Payer: Self-pay | Attending: Emergency Medicine | Admitting: Emergency Medicine

## 2014-05-29 DIAGNOSIS — Z8619 Personal history of other infectious and parasitic diseases: Secondary | ICD-10-CM | POA: Insufficient documentation

## 2014-05-29 DIAGNOSIS — K5900 Constipation, unspecified: Secondary | ICD-10-CM | POA: Insufficient documentation

## 2014-05-29 DIAGNOSIS — R109 Unspecified abdominal pain: Secondary | ICD-10-CM

## 2014-05-29 DIAGNOSIS — Z720 Tobacco use: Secondary | ICD-10-CM | POA: Insufficient documentation

## 2014-05-29 DIAGNOSIS — Z79899 Other long term (current) drug therapy: Secondary | ICD-10-CM | POA: Insufficient documentation

## 2014-05-29 DIAGNOSIS — Z8739 Personal history of other diseases of the musculoskeletal system and connective tissue: Secondary | ICD-10-CM | POA: Insufficient documentation

## 2014-05-29 DIAGNOSIS — F419 Anxiety disorder, unspecified: Secondary | ICD-10-CM | POA: Insufficient documentation

## 2014-05-29 DIAGNOSIS — Z8742 Personal history of other diseases of the female genital tract: Secondary | ICD-10-CM | POA: Insufficient documentation

## 2014-05-29 DIAGNOSIS — Z791 Long term (current) use of non-steroidal anti-inflammatories (NSAID): Secondary | ICD-10-CM | POA: Insufficient documentation

## 2014-05-29 LAB — COMPREHENSIVE METABOLIC PANEL
ALT: 13 U/L — AB (ref 14–54)
AST: 15 U/L (ref 15–41)
Albumin: 3.4 g/dL — ABNORMAL LOW (ref 3.5–5.0)
Alkaline Phosphatase: 38 U/L (ref 38–126)
Anion gap: 11 (ref 5–15)
BILIRUBIN TOTAL: 0.6 mg/dL (ref 0.3–1.2)
BUN: 9 mg/dL (ref 6–20)
CO2: 25 mmol/L (ref 22–32)
CREATININE: 0.76 mg/dL (ref 0.44–1.00)
Calcium: 8.8 mg/dL — ABNORMAL LOW (ref 8.9–10.3)
Chloride: 99 mmol/L — ABNORMAL LOW (ref 101–111)
GFR calc Af Amer: >60 mL/min (ref >60)
Glucose, Bld: 84 mg/dL (ref 70–99)
POTASSIUM: 4 mmol/L (ref 3.5–5.1)
Sodium: 135 mmol/L (ref 135–145)
TOTAL PROTEIN: 6.2 g/dL — AB (ref 6.5–8.1)

## 2014-05-29 LAB — CBC WITH DIFFERENTIAL/PLATELET
BASOS ABS: 0 10*3/uL (ref 0.0–0.1)
Basophils Relative: 1 % (ref 0–1)
Eosinophils Absolute: 0.1 10*3/uL (ref 0.0–0.7)
Eosinophils Relative: 1 % (ref 0–5)
HCT: 39.8 % (ref 36.0–46.0)
Hemoglobin: 13.2 g/dL (ref 12.0–15.0)
LYMPHS PCT: 38 % (ref 12–46)
Lymphs Abs: 2.5 10*3/uL (ref 0.7–4.0)
MCH: 30.8 pg (ref 26.0–34.0)
MCHC: 33.2 g/dL (ref 30.0–36.0)
MCV: 92.8 fL (ref 78.0–100.0)
Monocytes Absolute: 0.5 10*3/uL (ref 0.1–1.0)
Monocytes Relative: 8 % (ref 3–12)
NEUTROS ABS: 3.5 10*3/uL (ref 1.7–7.7)
NEUTROS PCT: 52 % (ref 43–77)
Platelets: 234 10*3/uL (ref 150–400)
RBC: 4.29 MIL/uL (ref 3.87–5.11)
RDW: 12.4 % (ref 11.5–15.5)
WBC: 6.6 10*3/uL (ref 4.0–10.5)

## 2014-05-29 MED ORDER — MAGNESIUM CITRATE PO SOLN
1.0000 | Freq: Once | ORAL | Status: AC
  Administered 2014-05-29: 1 via ORAL
  Filled 2014-05-29: qty 296

## 2014-05-29 MED ORDER — FENTANYL CITRATE (PF) 100 MCG/2ML IJ SOLN
50.0000 ug | INTRAMUSCULAR | Status: DC | PRN
  Administered 2014-05-29 (×2): 50 ug via INTRAVENOUS
  Filled 2014-05-29 (×2): qty 2

## 2014-05-29 MED ORDER — POLYETHYLENE GLYCOL 3350 17 GM/SCOOP PO POWD: 17.0000 g | Freq: Every day | ORAL | Status: DC

## 2014-05-29 MED ORDER — FENTANYL CITRATE (PF) 100 MCG/2ML IJ SOLN
50.0000 ug | Freq: Once | INTRAMUSCULAR | Status: AC
  Administered 2014-05-29: 50 ug via INTRAVENOUS
  Filled 2014-05-29: qty 2

## 2014-05-29 MED ORDER — IOHEXOL 300 MG/ML  SOLN
80.0000 mL | Freq: Once | INTRAMUSCULAR | Status: AC | PRN
  Administered 2014-05-29: 80 mL via INTRAVENOUS

## 2014-05-29 MED ORDER — IOHEXOL 300 MG/ML  SOLN
25.0000 mL | INTRAMUSCULAR | Status: AC
  Administered 2014-05-29: 25 mL via ORAL

## 2014-05-29 MED ORDER — ESOMEPRAZOLE MAGNESIUM 40 MG PO CPDR: 40.0000 mg | DELAYED_RELEASE_CAPSULE | Freq: Every day | ORAL | Status: DC

## 2014-05-29 MED ORDER — SODIUM CHLORIDE 0.9 % IV BOLUS (SEPSIS)
1000.0000 mL | Freq: Once | INTRAVENOUS | Status: AC
  Administered 2014-05-29: 1000 mL via INTRAVENOUS

## 2014-05-29 NOTE — Discharge Instructions (Signed)

## 2014-05-29 NOTE — ED Provider Notes (Signed)
CSN: [id number]     Arrival date & time 05/29/14  [id number] History   First MD Initiated Contact with Patient 05/29/14 [phone]     Chief Complaint  Patient presents with  . Abdominal Pain      HPI  Patient presents for continued abdominal pain after appendectomy last Thursday. Seen and diagnosed with appendicitis on Thursday. Operated on the same day. Had a laparoscopic appendectomy. Had had symptoms for less than 24 hours upon her arrival and they have surgery. Had some tenderness around her incision sites on postop day one and was discharged. States her pain was improving over the first 2 days. However, over the last few days it is worsening. Localized right lower quadrant pain. Minimal. Incisional pain. Is passing gas but has not had a bowel movement since the day before surgery. Pain however is not colicky or intermittent. No nausea or vomiting. Has a good appetite. No fevers or chills.  Past Medical History  Diagnosis Date  . Torn rotator cuff left shoulder  . Shingles 2006  . Polysubstance abuse   . Abnormal menstrual periods   . Anxiety    Past Surgical History  Procedure Laterality Date  . Tonsillectomy      24 y/o   . Wisdom tooth extraction      24 y/o   . Polypectomy      As a child multiple colonoscopy's for polyp removal  . Laparoscopic appendectomy N/A 05/23/2014    Procedure: APPENDECTOMY LAPAROSCOPIC;  Surgeon: Harriette Bouillonhomas Cornett, MD;  Location: [facility] OR;  Service: General;  Laterality: N/A;  . Laparoscopic lysis of adhesions N/A 05/23/2014    Procedure: LAPAROSCOPIC LYSIS OF ADHESIONS;  Surgeon: Harriette Bouillonhomas Cornett, MD;  Location: [facility] OR;  Service: General;  Laterality: N/A;   Family History  Problem Relation Age of Onset  . Alcoholism Father    History  Substance Use Topics  . Smoking status: Current Every Day Smoker -- 0.50 packs/day    Types: Cigarettes  . Smokeless tobacco: Former NeurosurgeonUser  . Alcohol Use: No   OB History    No data available     Review of Systems    Constitutional: Negative for fever, chills, diaphoresis, appetite change and fatigue.  HENT: Negative for mouth sores, sore throat and trouble swallowing.   Eyes: Negative for visual disturbance.  Respiratory: Negative for cough, chest tightness, shortness of breath and wheezing.   Cardiovascular: Negative for chest pain.  Gastrointestinal: Positive for abdominal pain and constipation. Negative for nausea, vomiting, diarrhea and abdominal distention.  Endocrine: Negative for polydipsia, polyphagia and polyuria.  Genitourinary: Negative for dysuria, frequency and hematuria.  Musculoskeletal: Negative for gait problem.  Skin: Negative for color change, pallor and rash.  Neurological: Negative for dizziness, syncope, light-headedness and headaches.  Hematological: Does not bruise/bleed easily.  Psychiatric/Behavioral: Negative for behavioral problems and confusion.      Allergies  Dilaudid  Home Medications   Prior to Admission medications   Medication Sig Start Date End Date Taking? Authorizing Provider  docusate sodium (COLACE) 100 MG capsule Take 1 capsule (100 mg total) by mouth 2 (two) times daily. 05/24/14  Yes Megan N Dort, PA-C  naltrexone (DEPADE) 50 MG tablet Take 0.5 tablets (25 mg total) by mouth daily. For alcoholism.opioid dependence 02/04/14  Yes Sanjuana KavaAgnes I Nwoko, NP  oxyCODONE-acetaminophen (PERCOCET/ROXICET) 5-325 MG per tablet Take 1-2 tablets by mouth every 6 (six) hours as needed for moderate pain. 05/24/14  Yes Megan N Dort, PA-C  PARoxetine (PAXIL) 10 MG tablet  Take 1 tablet (10 mg total) by mouth at bedtime. For depression 02/04/14  Yes Sanjuana KavaAgnes I Nwoko, NP  QUEtiapine (SEROQUEL) 25 MG tablet Take 1 tablet (25 mg total) by mouth 2 (two) times daily as needed (Anxiety). 02/04/14  Yes Sanjuana KavaAgnes I Nwoko, NP  esomeprazole (NEXIUM) 40 MG capsule Take 1 capsule (40 mg total) by mouth daily. 05/29/14   Rolland PorterMark Sharnelle Cappelli, MD  polyethylene glycol powder (GLYCOLAX/MIRALAX) powder Take 17 g by mouth  daily. 05/29/14   Rolland PorterMark Amariya Liskey, MD   BP 113/69 mmHg  Pulse 62  Temp(Src) 98.4 F (36.9 C) (Oral)  Resp 17  Ht 5\' 5"  (1.651 m)  Wt 143 lb (64.864 kg)  BMI 23.80 kg/m2  SpO2 89%  LMP 05/22/2014 Physical Exam  Constitutional: She is oriented to person, place, and time. She appears well-developed and well-nourished. No distress.  HENT:  Head: Normocephalic.  Eyes: Conjunctivae are normal. Pupils are equal, round, and reactive to light. No scleral icterus.  Neck: Normal range of motion. Neck supple. No thyromegaly present.  Cardiovascular: Normal rate and regular rhythm.  Exam reveals no gallop and no friction rub.   No murmur heard. Pulmonary/Chest: Effort normal and breath sounds normal. No respiratory distress. She has no wheezes. She has no rales.  Abdominal: Soft. Bowel sounds are normal. She exhibits no distension. There is no tenderness. There is no rebound.    Musculoskeletal: Normal range of motion.  Neurological: She is alert and oriented to person, place, and time.  Skin: Skin is warm and dry. No rash noted.  Psychiatric: She has a normal mood and affect. Her behavior is normal.    ED Course  Procedures (including critical care time) Labs Review Labs Reviewed  COMPREHENSIVE METABOLIC PANEL - Abnormal; Notable for the following:    Chloride 99 (*)    Calcium 8.8 (*)    Total Protein 6.2 (*)    Albumin 3.4 (*)    ALT 13 (*)    All other components within normal limits  CBC WITH DIFFERENTIAL/PLATELET    Imaging Review Ct Abdomen Pelvis W Contrast  05/29/2014   CLINICAL DATA:  Appendectomy 7 days ago.  Abdominal pain  EXAM: CT ABDOMEN AND PELVIS WITH CONTRAST  TECHNIQUE: Multidetector CT imaging of the abdomen and pelvis was performed using the standard protocol following bolus administration of intravenous contrast.  CONTRAST:  80mL OMNIPAQUE IOHEXOL 300 MG/ML  SOLN  COMPARISON:  CT abdomen pelvis 05/23/2014  FINDINGS: Lung bases clear.  Liver gallbladder and bile ducts  are normal. Spleen is normal. Pancreas is normal without mass or edema  Kidneys and adrenal glands are normal. No renal mass, obstruction, or calculi. Urinary bladder normal.  Uterus is normal.  No adnexal mass.  No free fluid in the pelvis  Negative for bowel obstruction. No bowel edema. There is mild thickening of the fascia posterior to the cecum on the right related to recent appendectomy. No abscess or fluid collection identified. No significant edema involving the terminal MR cecum.  Moderate stool throughout the colon  Negative lumbar spine  IMPRESSION: Mild postoperative edema in the right lower quadrant consistent with recent appendectomy. No abscess or free fluid. No bowel edema.  Constipation.   Electronically Signed   By: Marlan Palauharles  Clark M.D.   On: 05/29/2014 11:32     EKG Interpretation None      MDM   Final diagnoses:  AP (abdominal pain)  Constipation, unspecified constipation type    Normal blood cell count. Discussed with central  [place] surgery PA. CT scan pending.  CT shows some localized postoperative edema without abscess or fluid collection. Moderate stool burden. Plan is discharge home. Rediscussed with central and surgery after CT scan.    Rolland Porter, MD 05/29/14 1218

## 2014-05-29 NOTE — ED Notes (Signed)
Patient transported to CT 

## 2014-05-29 NOTE — ED Notes (Signed)
Patient reports having her appendix removed on Thursday, and has had pain since then. The pain became unbearable since yesterday. 3 incision sites on abdomen intact, no drainage noted. Abdomen tender on palpation, rates pain 10/10.

## 2014-05-30 ENCOUNTER — Ambulatory Visit: Payer: Self-pay | Attending: Family Medicine | Admitting: Family Medicine

## 2014-05-30 ENCOUNTER — Encounter: Payer: Self-pay | Admitting: Family Medicine

## 2014-05-30 VITALS — BP 100/67 | HR 82 | Temp 98.3°F | Resp 18 | Ht 65.0 in | Wt 138.0 lb

## 2014-05-30 DIAGNOSIS — Z79899 Other long term (current) drug therapy: Secondary | ICD-10-CM | POA: Insufficient documentation

## 2014-05-30 DIAGNOSIS — F17210 Nicotine dependence, cigarettes, uncomplicated: Secondary | ICD-10-CM | POA: Insufficient documentation

## 2014-05-30 DIAGNOSIS — F1120 Opioid dependence, uncomplicated: Secondary | ICD-10-CM | POA: Insufficient documentation

## 2014-05-30 DIAGNOSIS — R1030 Lower abdominal pain, unspecified: Secondary | ICD-10-CM | POA: Insufficient documentation

## 2014-05-30 DIAGNOSIS — F419 Anxiety disorder, unspecified: Secondary | ICD-10-CM | POA: Insufficient documentation

## 2014-05-30 DIAGNOSIS — K5909 Other constipation: Secondary | ICD-10-CM

## 2014-05-30 DIAGNOSIS — F17200 Nicotine dependence, unspecified, uncomplicated: Secondary | ICD-10-CM | POA: Insufficient documentation

## 2014-05-30 DIAGNOSIS — Z79891 Long term (current) use of opiate analgesic: Secondary | ICD-10-CM | POA: Insufficient documentation

## 2014-05-30 DIAGNOSIS — F1020 Alcohol dependence, uncomplicated: Secondary | ICD-10-CM | POA: Insufficient documentation

## 2014-05-30 DIAGNOSIS — Z48815 Encounter for surgical aftercare following surgery on the digestive system: Secondary | ICD-10-CM | POA: Insufficient documentation

## 2014-05-30 DIAGNOSIS — Z720 Tobacco use: Secondary | ICD-10-CM

## 2014-05-30 DIAGNOSIS — K3580 Unspecified acute appendicitis: Secondary | ICD-10-CM

## 2014-05-30 DIAGNOSIS — K5900 Constipation, unspecified: Secondary | ICD-10-CM | POA: Insufficient documentation

## 2014-05-30 DIAGNOSIS — G8918 Other acute postprocedural pain: Secondary | ICD-10-CM | POA: Insufficient documentation

## 2014-05-30 NOTE — Progress Notes (Signed)
Patient hospitalized for emergency appendectomy, stayed 1 night. Patient returned to Ed yesterday for "really bad stomach pain". Patient reports feeling better today. Patient reports pain mid-abdomen, from left to right, at level 4-5, described as sore, achy pain.

## 2014-05-30 NOTE — Patient Instructions (Signed)

## 2014-05-30 NOTE — Progress Notes (Signed)
Subjective:    Patient ID: Kristen Schneider, female    DOB: 10-31-1990, 24 y.o.   MRN: [id number]  HPI   admit date: 05/23/14   discharge date : 4/29/ 16  Kristen Schneider had presented to [facility] ED with acute onset of severe RLQ abdominal pain .The pain was sharp/stabbing, 6-7/10. CT scan showed early tip appendicitis. WBC wass normal, gyn workup was negative.   The patient was admitted and taken to the OR where she underwent a laparoscopic appendectomy. She tolerated the procedure well. She was tolerating a regular diet and oral pain meds on POD 1. She did however have an allergic reaction to either Dilaudid or Morphine. These were stopped. She was given benadryl initially with little help. She was given a one time dose of solu-medrol, which helped. She was eager to go home later on POD 1.   Interval history: She presented to the ED yesterday for lower abdominal pain where she had a CT abdomen which revealed some localized postoperative edema without abscess or fluid collection. Moderate stool burden and she was placed on MiraLAX which she reports helps him move her bowels and pain at this time is 4/10. She does admit feeling bloated.  Past Medical History  Diagnosis Date  . Torn rotator cuff left shoulder  . Shingles 2006  . Polysubstance abuse   . Abnormal menstrual periods   . Anxiety     Past Surgical History  Procedure Laterality Date  . Tonsillectomy      24 y/o   . Wisdom tooth extraction      24 y/o   . Polypectomy      As a child multiple colonoscopy's for polyp removal  . Laparoscopic appendectomy N/A 05/23/2014    Procedure: APPENDECTOMY LAPAROSCOPIC;  Surgeon: Harriette Bouillon, MD;  Location: [facility] OR;  Service: General;  Laterality: N/A;  . Laparoscopic lysis of adhesions N/A 05/23/2014    Procedure: LAPAROSCOPIC LYSIS OF ADHESIONS;  Surgeon: Harriette Bouillon, MD;  Location: [facility] OR;  Service: General;  Laterality: N/A;  . Appendectomy      Family History    Problem Relation Age of Onset  . Alcoholism Father     History   Social History  . Marital Status: Single    Spouse Name: N/A  . Number of Children: N/A  . Years of Education: N/A   Occupational History  . Not on file.   Social History Main Topics  . Smoking status: Current Every Day Smoker -- 1.00 packs/day    Types: Cigarettes  . Smokeless tobacco: Former Neurosurgeon  . Alcohol Use: No  . Drug Use: No     Comment: 05/23/14 - reports no use for last 4 months  . Sexual Activity: Yes    Birth Control/ Protection: Pill   Other Topics Concern  . Not on file   Social History Narrative    Allergies  Allergen Reactions  . Dilaudid [Hydromorphone Hcl] Rash    Rash and itching    . Current Outpatient Prescriptions on File Prior to Visit  Medication Sig Dispense Refill  . docusate sodium (COLACE) 100 MG capsule Take 1 capsule (100 mg total) by mouth 2 (two) times daily.  0  . esomeprazole (NEXIUM) 40 MG capsule Take 1 capsule (40 mg total) by mouth daily. 30 capsule 0  . naltrexone (DEPADE) 50 MG tablet Take 0.5 tablets (25 mg total) by mouth daily. For alcoholism.opioid dependence 30 tablet 0  . oxyCODONE-acetaminophen (PERCOCET/ROXICET) 5-325 MG per tablet  Take 1-2 tablets by mouth every 6 (six) hours as needed for moderate pain. 40 tablet 0  . PARoxetine (PAXIL) 10 MG tablet Take 1 tablet (10 mg total) by mouth at bedtime. For depression 30 tablet 0  . polyethylene glycol powder (GLYCOLAX/MIRALAX) powder Take 17 g by mouth daily. 255 g 0  . QUEtiapine (SEROQUEL) 25 MG tablet Take 1 tablet (25 mg total) by mouth 2 (two) times daily as needed (Anxiety). 60 tablet 0   No current facility-administered medications on file prior to visit.     Review of Systems  Constitutional: Negative for activity change, appetite change and fatigue.  HENT: Negative for congestion, sinus pressure and sore throat.   Eyes: Negative for visual disturbance.  Respiratory: Negative for cough, chest  tightness, shortness of breath and wheezing.   Cardiovascular: Negative for chest pain and palpitations.  Gastrointestinal: Positive for abdominal pain. Negative for constipation and abdominal distention.       Abdominal bloating  Endocrine: Negative for polydipsia.  Genitourinary: Negative for dysuria and frequency.  Musculoskeletal: Negative for back pain and arthralgias.  Skin: Negative for rash.  Neurological: Negative for tremors, light-headedness and numbness.  Hematological: Does not bruise/bleed easily.  Psychiatric/Behavioral: Negative for behavioral problems and agitation.         Objective: Filed Vitals:   05/30/14 1016  BP: 100/67  Pulse: 82  Temp: 98.3 F (36.8 C)  Resp: 18      Physical Exam  Constitutional: She is oriented to person, place, and time. She appears well-developed and well-nourished. No distress.  HENT:  Head: Normocephalic.  Right Ear: External ear normal.  Left Ear: External ear normal.  Nose: Nose normal.  Mouth/Throat: Oropharynx is clear and moist.  Eyes: Conjunctivae and EOM are normal. Pupils are equal, round, and reactive to light.  Neck: Normal range of motion. No JVD present.  Cardiovascular: Normal rate, regular rhythm, normal heart sounds and intact distal pulses.  Exam reveals no gallop.   No murmur heard. Pulmonary/Chest: Effort normal and breath sounds normal. No respiratory distress. She has no wheezes. She has no rales. She exhibits no tenderness.  Abdominal: Soft. Bowel sounds are normal.  laparoscopic ports no sign of infection; mild distention of suprapubic abdomen, minimal tenderness.  Musculoskeletal: Normal range of motion. She exhibits no edema or tenderness.  Neurological: She is alert and oriented to person, place, and time. She has normal reflexes.  Skin: Skin is warm and dry. She is not diaphoretic.  Psychiatric: She has a normal mood and affect.      EXAM: CT ABDOMEN AND PELVIS WITH  CONTRAST  TECHNIQUE: Multidetector CT imaging of the abdomen and pelvis was performed using the standard protocol following bolus administration of intravenous contrast.  CONTRAST: 80mL OMNIPAQUE IOHEXOL 300 MG/ML SOLN  COMPARISON: CT abdomen pelvis 05/23/2014  FINDINGS: Lung bases clear.  Liver gallbladder and bile ducts are normal. Spleen is normal. Pancreas is normal without mass or edema  Kidneys and adrenal glands are normal. No renal mass, obstruction, or calculi. Urinary bladder normal.  Uterus is normal. No adnexal mass. No free fluid in the pelvis  Negative for bowel obstruction. No bowel edema. There is mild thickening of the fascia posterior to the cecum on the right related to recent appendectomy. No abscess or fluid collection identified. No significant edema involving the terminal MR cecum.  Moderate stool throughout the colon  Negative lumbar spine  IMPRESSION: Mild postoperative edema in the right lower quadrant consistent with recent appendectomy. No  abscess or free fluid. No bowel edema.  Constipation.   Electronically Signed  By: Marlan Palauharles Clark M.D.  On: 05/29/2014 11:32      Assessment & Plan:    24 year old female status post laparoscopic appendectomy with recent presentation to the ED for postoperative abdominal pain and imaging suggestive of constipation which was treated and she reports improvement in symptoms.  Acute appendicitis status post appendectomy: Postop day 7, patient with mild pain. Advised to continue MiraLAX to prevent constipation. Scheduled to see Gen. surgery on 06/11/14  Constipation: Improved Continue Miralax  Tobacco use disorder: Smoking cessation support: smoking cessation hotline: 1-800-QUIT-NOW.  Smoking cessation classes are available through [facility]. Call [phone] or visit our website at [email or web address].  Spent 4 minutes counseling on smoking  cessation and patient is not ready to quit and refuses a prescription for patches..Marland Kitchen

## 2014-06-15 ENCOUNTER — Encounter (HOSPITAL_COMMUNITY): Payer: Self-pay | Admitting: Surgery

## 2014-06-15 NOTE — Addendum Note (Signed)
Addendum  created 06/15/14 1943 by Judie Petitharlene Edwards, MD   Modules edited: Anesthesia Events

## 2014-06-23 ENCOUNTER — Inpatient Hospital Stay (HOSPITAL_COMMUNITY)
Admission: EM | Admit: 2014-06-23 | Discharge: 2014-06-24 | DRG: 917 | Discharge status: Against Medical Advice | Payer: Federal, State, Local not specified - Other | Attending: Internal Medicine | Admitting: Internal Medicine

## 2014-06-23 ENCOUNTER — Encounter (HOSPITAL_COMMUNITY): Payer: Self-pay | Admitting: *Deleted

## 2014-06-23 DIAGNOSIS — F319 Bipolar disorder, unspecified: Secondary | ICD-10-CM | POA: Diagnosis present

## 2014-06-23 DIAGNOSIS — Z885 Allergy status to narcotic agent status: Secondary | ICD-10-CM

## 2014-06-23 DIAGNOSIS — R001 Bradycardia, unspecified: Secondary | ICD-10-CM | POA: Diagnosis present

## 2014-06-23 DIAGNOSIS — Z79891 Long term (current) use of opiate analgesic: Secondary | ICD-10-CM

## 2014-06-23 DIAGNOSIS — F419 Anxiety disorder, unspecified: Secondary | ICD-10-CM | POA: Diagnosis present

## 2014-06-23 DIAGNOSIS — Z79899 Other long term (current) drug therapy: Secondary | ICD-10-CM

## 2014-06-23 DIAGNOSIS — F17210 Nicotine dependence, cigarettes, uncomplicated: Secondary | ICD-10-CM | POA: Diagnosis present

## 2014-06-23 DIAGNOSIS — F909 Attention-deficit hyperactivity disorder, unspecified type: Secondary | ICD-10-CM | POA: Diagnosis present

## 2014-06-23 DIAGNOSIS — G92 Toxic encephalopathy: Secondary | ICD-10-CM | POA: Diagnosis present

## 2014-06-23 DIAGNOSIS — Z811 Family history of alcohol abuse and dependence: Secondary | ICD-10-CM

## 2014-06-23 DIAGNOSIS — T50904A Poisoning by unspecified drugs, medicaments and biological substances, undetermined, initial encounter: Secondary | ICD-10-CM

## 2014-06-23 DIAGNOSIS — T50902A Poisoning by unspecified drugs, medicaments and biological substances, intentional self-harm, initial encounter: Secondary | ICD-10-CM

## 2014-06-23 DIAGNOSIS — T43592A Poisoning by other antipsychotics and neuroleptics, intentional self-harm, initial encounter: Principal | ICD-10-CM | POA: Diagnosis present

## 2014-06-23 LAB — URINALYSIS, ROUTINE W REFLEX MICROSCOPIC
Bilirubin Urine: NEGATIVE
GLUCOSE, UA: NEGATIVE mg/dL
Hgb urine dipstick: NEGATIVE
KETONES UR: NEGATIVE mg/dL
Leukocytes, UA: NEGATIVE
NITRITE: NEGATIVE
Protein, ur: NEGATIVE mg/dL
SPECIFIC GRAVITY, URINE: 1.006 (ref 1.005–1.030)
UROBILINOGEN UA: 0.2 mg/dL (ref 0.0–1.0)
pH: 6.5 (ref 5.0–8.0)

## 2014-06-23 LAB — ACETAMINOPHEN LEVEL: Acetaminophen (Tylenol), Serum: <10 ug/mL — ABNORMAL LOW (ref 10–30)

## 2014-06-23 LAB — CBC WITH DIFFERENTIAL/PLATELET
BASOS PCT: 0 % (ref 0–1)
Basophils Absolute: 0 10*3/uL (ref 0.0–0.1)
EOS PCT: 2 % (ref 0–5)
Eosinophils Absolute: 0.1 10*3/uL (ref 0.0–0.7)
HCT: 38.9 % (ref 36.0–46.0)
HEMOGLOBIN: 12.9 g/dL (ref 12.0–15.0)
LYMPHS ABS: 1.7 10*3/uL (ref 0.7–4.0)
Lymphocytes Relative: 33 % (ref 12–46)
MCH: 30.9 pg (ref 26.0–34.0)
MCHC: 33.2 g/dL (ref 30.0–36.0)
MCV: 93.1 fL (ref 78.0–100.0)
MONOS PCT: 11 % (ref 3–12)
Monocytes Absolute: 0.5 10*3/uL (ref 0.1–1.0)
NEUTROS ABS: 2.7 10*3/uL (ref 1.7–7.7)
Neutrophils Relative %: 54 % (ref 43–77)
PLATELETS: 197 10*3/uL (ref 150–400)
RBC: 4.18 MIL/uL (ref 3.87–5.11)
RDW: 12.8 % (ref 11.5–15.5)
WBC: 5 10*3/uL (ref 4.0–10.5)

## 2014-06-23 LAB — SALICYLATE LEVEL: Salicylate Lvl: <4 mg/dL (ref 2.8–30.0)

## 2014-06-23 LAB — COMPREHENSIVE METABOLIC PANEL
ALT: 14 U/L (ref 14–54)
ANION GAP: 10 (ref 5–15)
AST: 21 U/L (ref 15–41)
Albumin: 3.7 g/dL (ref 3.5–5.0)
Alkaline Phosphatase: 43 U/L (ref 38–126)
BILIRUBIN TOTAL: 0.9 mg/dL (ref 0.3–1.2)
BUN: 14 mg/dL (ref 6–20)
CO2: 23 mmol/L (ref 22–32)
Calcium: 8.9 mg/dL (ref 8.9–10.3)
Chloride: 106 mmol/L (ref 101–111)
Creatinine, Ser: 0.7 mg/dL (ref 0.44–1.00)
GLUCOSE: 100 mg/dL — AB (ref 65–99)
Potassium: 3.5 mmol/L (ref 3.5–5.1)
Sodium: 139 mmol/L (ref 135–145)
TOTAL PROTEIN: 7 g/dL (ref 6.5–8.1)

## 2014-06-23 LAB — ETHANOL: Alcohol, Ethyl (B): <5 mg/dL (ref <5)

## 2014-06-23 LAB — POC URINE PREG, ED: PREG TEST UR: NEGATIVE

## 2014-06-23 LAB — RAPID URINE DRUG SCREEN, HOSP PERFORMED
Amphetamines: NOT DETECTED
BARBITURATES: NOT DETECTED
Benzodiazepines: POSITIVE — AB
Cocaine: NOT DETECTED
Opiates: NOT DETECTED
TETRAHYDROCANNABINOL: POSITIVE — AB

## 2014-06-23 LAB — MRSA PCR SCREENING: MRSA by PCR: NEGATIVE

## 2014-06-23 LAB — I-STAT CG4 LACTIC ACID, ED: Lactic Acid, Venous: 1.5 mmol/L (ref 0.5–2.0)

## 2014-06-23 LAB — MAGNESIUM: Magnesium: 1.9 mg/dL (ref 1.7–2.4)

## 2014-06-23 MED ORDER — SODIUM CHLORIDE 0.9 % IV SOLN: INTRAVENOUS | Status: AC

## 2014-06-23 MED ORDER — LORAZEPAM 2 MG/ML IJ SOLN
0.5000 mg | Freq: Once | INTRAMUSCULAR | Status: DC
  Filled 2014-06-23: qty 1

## 2014-06-23 MED ORDER — SODIUM CHLORIDE 0.9 % IV BOLUS (SEPSIS)
1000.0000 mL | Freq: Once | INTRAVENOUS | Status: AC
  Administered 2014-06-23: 1000 mL via INTRAVENOUS

## 2014-06-23 MED ORDER — LORAZEPAM 2 MG/ML IJ SOLN: 1.0000 mg | Freq: Once | INTRAMUSCULAR | Status: DC

## 2014-06-23 MED ORDER — ENOXAPARIN SODIUM 40 MG/0.4ML ~~LOC~~ SOLN
40.0000 mg | SUBCUTANEOUS | Status: DC
  Administered 2014-06-23: 40 mg via SUBCUTANEOUS
  Filled 2014-06-23 (×2): qty 0.4

## 2014-06-23 MED ORDER — SODIUM CHLORIDE 0.9 % IV SOLN
INTRAVENOUS | Status: DC
  Administered 2014-06-23: 22:00:00 via INTRAVENOUS

## 2014-06-23 MED ORDER — SODIUM CHLORIDE 0.9 % IV BOLUS (SEPSIS)
1000.0000 mL | Freq: Once | INTRAVENOUS | Status: AC
Start: 2014-06-23 — End: 2014-06-23
  Administered 2014-06-23: 1000 mL via INTRAVENOUS

## 2014-06-23 MED ORDER — SODIUM CHLORIDE 0.9 % IV SOLN: INTRAVENOUS | Status: DC

## 2014-06-23 MED ORDER — SODIUM CHLORIDE 0.9 % IV SOLN
Freq: Once | INTRAVENOUS | Status: AC
  Administered 2014-06-23: 1000 mL via INTRAVENOUS

## 2014-06-23 NOTE — ED Notes (Signed)
RN unable to report at this time. Will call back when available

## 2014-06-23 NOTE — ED Notes (Signed)
Witnessed in and out cath done by Gillis EndsPatty Dowd, RN
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
## 2014-06-23 NOTE — ED Notes (Signed)
PA notified that pt HR is in the 40's. PA at bedside

## 2014-06-23 NOTE — ED Notes (Signed)
Patient was able to verbalize name and date of birth, patient is now resting.
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
## 2014-06-23 NOTE — ED Notes (Signed)
Poison controlled called and was given status, VS and lab results.
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
## 2014-06-24 DIAGNOSIS — R001 Bradycardia, unspecified: Secondary | ICD-10-CM

## 2014-06-24 DIAGNOSIS — T43592A Poisoning by other antipsychotics and neuroleptics, intentional self-harm, initial encounter: Principal | ICD-10-CM

## 2014-06-24 DIAGNOSIS — T50901A Poisoning by unspecified drugs, medicaments and biological substances, accidental (unintentional), initial encounter: Secondary | ICD-10-CM

## 2014-06-24 DIAGNOSIS — G9340 Encephalopathy, unspecified: Secondary | ICD-10-CM

## 2014-06-24 DIAGNOSIS — F1910 Other psychoactive substance abuse, uncomplicated: Secondary | ICD-10-CM | POA: Diagnosis not present

## 2014-06-24 LAB — CBC
HCT: 36.2 % (ref 36.0–46.0)
Hemoglobin: 11.6 g/dL — ABNORMAL LOW (ref 12.0–15.0)
MCH: 30.4 pg (ref 26.0–34.0)
MCHC: 32 g/dL (ref 30.0–36.0)
MCV: 94.8 fL (ref 78.0–100.0)
Platelets: 160 10*3/uL (ref 150–400)
RBC: 3.82 MIL/uL — AB (ref 3.87–5.11)
RDW: 12.9 % (ref 11.5–15.5)
WBC: 4.2 10*3/uL (ref 4.0–10.5)

## 2014-06-24 LAB — BASIC METABOLIC PANEL
ANION GAP: 7 (ref 5–15)
BUN: 13 mg/dL (ref 6–20)
CO2: 23 mmol/L (ref 22–32)
Calcium: 7.9 mg/dL — ABNORMAL LOW (ref 8.9–10.3)
Chloride: 114 mmol/L — ABNORMAL HIGH (ref 101–111)
Creatinine, Ser: 0.64 mg/dL (ref 0.44–1.00)
GFR calc Af Amer: >60 mL/min (ref >60)
GFR calc non Af Amer: >60 mL/min (ref >60)
GLUCOSE: 80 mg/dL (ref 65–99)
POTASSIUM: 3.3 mmol/L — AB (ref 3.5–5.1)
Sodium: 144 mmol/L (ref 135–145)

## 2014-06-24 LAB — LITHIUM LEVEL: Lithium Lvl: <0.06 mmol/L — ABNORMAL LOW (ref 0.60–1.20)

## 2014-06-24 LAB — AMMONIA: Ammonia: 11 umol/L (ref 9–35)

## 2014-06-24 LAB — VALPROIC ACID LEVEL: Valproic Acid Lvl: <10 ug/mL — ABNORMAL LOW (ref 50.0–100.0)

## 2014-06-24 LAB — TSH: TSH: 0.534 u[IU]/mL (ref 0.350–4.500)

## 2014-06-24 MED ORDER — PAROXETINE HCL 10 MG PO TABS
10.0000 mg | ORAL_TABLET | Freq: Every day | ORAL | Status: DC
  Filled 2014-06-24: qty 1

## 2014-06-24 MED ORDER — PNEUMOCOCCAL VAC POLYVALENT 25 MCG/0.5ML IJ INJ: 0.5000 mL | INJECTION | INTRAMUSCULAR | Status: DC

## 2014-06-24 MED ORDER — POTASSIUM CHLORIDE CRYS ER 20 MEQ PO TBCR
40.0000 meq | EXTENDED_RELEASE_TABLET | Freq: Once | ORAL | Status: AC
  Administered 2014-06-24: 40 meq via ORAL
  Filled 2014-06-24: qty 2

## 2014-06-24 MED ORDER — NALTREXONE HCL 50 MG PO TABS
25.0000 mg | ORAL_TABLET | Freq: Every day | ORAL | Status: DC
  Administered 2014-06-24: 25 mg via ORAL
  Filled 2014-06-24: qty 1

## 2014-06-24 MED ORDER — CETYLPYRIDINIUM CHLORIDE 0.05 % MT LIQD
7.0000 mL | Freq: Two times a day (BID) | OROMUCOSAL | Status: DC
  Administered 2014-06-24: 7 mL via OROMUCOSAL

## 2014-06-24 MED ORDER — PANTOPRAZOLE SODIUM 40 MG PO TBEC
80.0000 mg | DELAYED_RELEASE_TABLET | Freq: Every day | ORAL | Status: DC
  Administered 2014-06-24: 80 mg via ORAL
  Filled 2014-06-24: qty 2

## 2014-06-24 MED ORDER — GABAPENTIN 300 MG PO CAPS
300.0000 mg | ORAL_CAPSULE | Freq: Three times a day (TID) | ORAL | Status: DC
  Administered 2014-06-24: 300 mg via ORAL
  Filled 2014-06-24 (×2): qty 1
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
## 2014-06-24 NOTE — Progress Notes (Signed)
Pt shaking and yelling in room that she is leaving hospital. AMA explained to pt. Food came from kitchen and pt said she "would stay". Writer explained to MD. MD stated to let pt go AMA if she demanded leaving again.
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
## 2014-06-24 NOTE — Progress Notes (Addendum)
PATIENT DETAILS Name: Kristen Schneider Age: 24 y.o. Sex: female Date of Birth: 03-26-1990 Admit Date: 06/23/2014 Admitting Physician Albertine GratesFang Xu, MD PCP:No PCP Per Patient  Subjective: Awake and alert this morning. Denies major complaints-claims she took 4-5 pills of Seroquel to go to "sleep". Denies overdose of Neurontin, naltrexone along with Seroquel.  Assessment/Plan: Active Problems: Drug overdose: Overdose with Seroquel, claims she just wanted to sleep. On admission was very drowsy, currently awake and alert. Resume diet, discontinue Foley, discontinue IV fluids. Await psych evaluation, sitter in place. Spoke to poison control, no further recommendations apart from supportive care.  Addendum 1 JW:JXBJYpm:spoke with Dr Barnie DelJ-ok to d/c sitter, [facility], ok to resume Paxil, Naltrexone.   Acute toxic encephalopathy:resolved. Secondary to above.  Sinus bradycardia: Mild sinus bradycardia especially when sleeping. Check TSH. Good chronotropic response when awaken and with minimal movement. Per poison control, this is not expected with Seroquel. We will continue to monitor in telemetry-may be a chronic issue.  History of substance abuse: On chronic naltrexone-we will resume.  Disposition: Remain inpatient-transfer to telemetry  Antimicrobial agents  See below  Anti-infectives    None      DVT Prophylaxis: Prophylactic Lovenox   Code Status: Full code   Family Communication None at bedside  Procedures: None  CONSULTS:  psychiatry  Time spent 30 minutes-Greater than 50% of this time was spent in counseling, explanation of diagnosis, planning of further management, and coordination of care.  MEDICATIONS: Scheduled Meds: . antiseptic oral rinse  7 mL Mouth Rinse BID  . enoxaparin (LOVENOX) injection  40 mg Subcutaneous Q24H  . [START ON 06/25/2014] pneumococcal 23 valent vaccine  0.5 mL Intramuscular Tomorrow-1000   Continuous Infusions:    PRN Meds:.    PHYSICAL EXAM: Vital signs in last 24 hours: Filed Vitals:   06/24/14 0700 06/24/14 0800 06/24/14 0806 06/24/14 0900  BP: 147/87 122/65  135/82  Pulse: 55 51  56  Temp:   98 F (36.7 C)   TempSrc:   Oral   Resp: 22 12  16   Height:      Weight:      SpO2: 100% 100%  98%    Weight change:  Filed Weights   06/23/14 1805  Weight: 64.5 kg (142 lb 3.2 oz)   Body mass index is 23.66 kg/(m^2).   Gen Exam: Awake and alert with clear speech Neck: Supple, No JVD.   Chest: B/L Clear.   CVS: S1 S2 Regular, no murmurs.  Abdomen: soft, BS +, non tender, non distended.  Extremities: no edema, lower extremities warm to touch. Neurologic: Non Focal.   Skin: No Rash.   Wounds: N/A.    Intake/Output from previous day:  Intake/Output Summary (Last 24 hours) at 06/24/14 1014 Last data filed at 06/24/14 0900  Gross per 24 hour  Intake 3858.75 ml  Output   1375 ml  Net 2483.75 ml     LAB RESULTS: CBC  Recent Labs Lab 06/23/14 1105 06/24/14 0407  WBC 5.0 4.2  HGB 12.9 11.6*  HCT 38.9 36.2  PLT 197 160  MCV 93.1 94.8  MCH 30.9 30.4  MCHC 33.2 32.0  RDW 12.8 12.9  LYMPHSABS 1.7  --   MONOABS 0.5  --   EOSABS 0.1  --   BASOSABS 0.0  --     Chemistries   Recent Labs Lab 06/23/14 1105 06/24/14 0407  NA 139 144  K 3.5 3.3*  CL 106 114*  CO2 23 23  GLUCOSE 100* 80  BUN 14 13  CREATININE 0.70 0.64  CALCIUM 8.9 7.9*  MG 1.9  --     CBG: No results for input(s): GLUCAP in the last 168 hours.  GFR Estimated Creatinine Clearance: 99.3 mL/min (by C-G formula based on Cr of 0.64).  Coagulation profile No results for input(s): INR, PROTIME in the last 168 hours.  Cardiac Enzymes No results for input(s): CKMB, TROPONINI, MYOGLOBIN in the last 168 hours.  Invalid input(s): CK  Invalid input(s): POCBNP No results for input(s): DDIMER in the last 72 hours. No results for input(s): HGBA1C in the last 72 hours. No results for input(s): CHOL,  HDL, LDLCALC, TRIG, CHOLHDL, LDLDIRECT in the last 72 hours.  Recent Labs  06/24/14 0848  TSH 0.534   No results for input(s): VITAMINB12, FOLATE, FERRITIN, TIBC, IRON, RETICCTPCT in the last 72 hours. No results for input(s): LIPASE, AMYLASE in the last 72 hours.  Urine Studies No results for input(s): UHGB, CRYS in the last 72 hours.  Invalid input(s): UACOL, UAPR, USPG, UPH, UTP, UGL, UKET, UBIL, UNIT, UROB, ULEU, UEPI, UWBC, URBC, UBAC, CAST, UCOM, BILUA  MICROBIOLOGY: Recent Results (from the past 240 hour(s))  MRSA PCR Screening     Status: None   Collection Time: 06/23/14  6:30 PM  Result Value Ref Range Status   MRSA by PCR NEGATIVE NEGATIVE Final    Comment:        The GeneXpert MRSA Assay (FDA approved for NASAL specimens only), is one component of a comprehensive MRSA colonization surveillance program. It is not intended to diagnose MRSA infection nor to guide or monitor treatment for MRSA infections.     RADIOLOGY STUDIES/RESULTS: Ct Abdomen Pelvis W Contrast  05/29/2014   CLINICAL DATA:  Appendectomy 7 days ago.  Abdominal pain  EXAM: CT ABDOMEN AND PELVIS WITH CONTRAST  TECHNIQUE: Multidetector CT imaging of the abdomen and pelvis was performed using the standard protocol following bolus administration of intravenous contrast.  CONTRAST:  80mL OMNIPAQUE IOHEXOL 300 MG/ML  SOLN  COMPARISON:  CT abdomen pelvis 05/23/2014  FINDINGS: Lung bases clear.  Liver gallbladder and bile ducts are normal. Spleen is normal. Pancreas is normal without mass or edema  Kidneys and adrenal glands are normal. No renal mass, obstruction, or calculi. Urinary bladder normal.  Uterus is normal.  No adnexal mass.  No free fluid in the pelvis  Negative for bowel obstruction. No bowel edema. There is mild thickening of the fascia posterior to the cecum on the right related to recent appendectomy. No abscess or fluid collection identified. No significant edema involving the terminal MR cecum.   Moderate stool throughout the colon  Negative lumbar spine  IMPRESSION: Mild postoperative edema in the right lower quadrant consistent with recent appendectomy. No abscess or free fluid. No bowel edema.  Constipation.   Electronically Signed   By: Marlan Palau M.D.   On: 05/29/2014 11:32    Jeoffrey Massed, MD  [facility] Pager:[phone]  If 7PM-7AM, please contact night-coverage [email or web address] Password [email or web address] 06/24/2014, 10:14 AM   LOS: 1 day

## 2014-06-26 NOTE — Discharge Summary (Signed)
PATIENT DETAILS Name: Kristen Schneider Age: 24 y.o. Sex: female Date of Birth: 05/21/1990 MRN: [id number]. Admitting Physician: Albertine Grates, MD PCP:No PCP Per Patient  Admit Date: 06/23/2014 Discharge date: 06/26/2014  NOTE:PATIENT SIGNED OUT AMA  PRIMARY DISCHARGE DIAGNOSIS:  Active Problems:   Drug overdose   Overdose      PAST MEDICAL HISTORY: Past Medical History  Diagnosis Date  . Torn rotator cuff left shoulder  . Shingles 2006  . Polysubstance abuse   . Abnormal menstrual periods   . Anxiety     DISCHARGE MEDICATIONS: Note-below is a list of medications that she was taking prior to admission.  Discharge Medication List as of 06/24/2014  7:19 PM    CONTINUE these medications which have NOT CHANGED   Details  gabapentin (NEURONTIN) 300 MG capsule Take 300 mg by mouth 3 (three) times daily., Until Discontinued, Historical Med    PARoxetine (PAXIL) 10 MG tablet Take 1 tablet (10 mg total) by mouth at bedtime. For depression, Starting 02/04/2014, Until Discontinued, Normal    polyethylene glycol powder (GLYCOLAX/MIRALAX) powder Take 17 g by mouth daily., Starting 05/29/2014, Until Discontinued, Print    QUEtiapine (SEROQUEL) 25 MG tablet Take 1 tablet (25 mg total) by mouth 2 (two) times daily as needed (Anxiety)., Starting 02/04/2014, Until Discontinued, Normal    docusate sodium (COLACE) 100 MG capsule Take 1 capsule (100 mg total) by mouth 2 (two) times daily., Starting 05/24/2014, Until Discontinued, No Print    esomeprazole (NEXIUM) 40 MG capsule Take 1 capsule (40 mg total) by mouth daily., Starting 05/29/2014, Until Discontinued, Print    naltrexone (DEPADE) 50 MG tablet Take 0.5 tablets (25 mg total) by mouth daily. For alcoholism.opioid dependence, Starting 02/04/2014, Until Discontinued, Normal    oxyCODONE-acetaminophen (PERCOCET/ROXICET) 5-325 MG per tablet Take 1-2 tablets by mouth every 6 (six) hours as needed for moderate pain., Starting 05/24/2014, Until  Discontinued, Print        ALLERGIES:   Allergies  Allergen Reactions  . Dilaudid [Hydromorphone Hcl] Rash    Rash and itching    BRIEF HPI:  See H&P, Labs, Consult and Test reports for all details in brief, patient is a 24 year old female with prior history of polysubstance abuse, bipolar disorder who came in with a Seroquel overdose. She was subsequently admitted for further evaluation and treatment.  CONSULTATIONS:   psychiatry  PERTINENT RADIOLOGIC STUDIES: Ct Abdomen Pelvis W Contrast  05/29/2014   CLINICAL DATA:  Appendectomy 7 days ago.  Abdominal pain  EXAM: CT ABDOMEN AND PELVIS WITH CONTRAST  TECHNIQUE: Multidetector CT imaging of the abdomen and pelvis was performed using the standard protocol following bolus administration of intravenous contrast.  CONTRAST:  80mL OMNIPAQUE IOHEXOL 300 MG/ML  SOLN  COMPARISON:  CT abdomen pelvis 05/23/2014  FINDINGS: Lung bases clear.  Liver gallbladder and bile ducts are normal. Spleen is normal. Pancreas is normal without mass or edema  Kidneys and adrenal glands are normal. No renal mass, obstruction, or calculi. Urinary bladder normal.  Uterus is normal.  No adnexal mass.  No free fluid in the pelvis  Negative for bowel obstruction. No bowel edema. There is mild thickening of the fascia posterior to the cecum on the right related to recent appendectomy. No abscess or fluid collection identified. No significant edema involving the terminal MR cecum.  Moderate stool throughout the colon  Negative lumbar spine  IMPRESSION: Mild postoperative edema in the right lower quadrant consistent with recent appendectomy. No abscess or free  fluid. No bowel edema.  Constipation.   Electronically Signed   By: Marlan Palau M.D.   On: 05/29/2014 11:32     PERTINENT LAB RESULTS: CBC:  Recent Labs  06/24/14 0407  WBC 4.2  HGB 11.6*  HCT 36.2  PLT 160   CMET CMP     Component Value Date/Time   NA 144 06/24/2014 0407   NA 145 11/01/2011 2116   K  3.3* 06/24/2014 0407   K 3.8 11/01/2011 2116   CL 114* 06/24/2014 0407   CL 108* 11/01/2011 2116   CO2 23 06/24/2014 0407   CO2 28 11/01/2011 2116   GLUCOSE 80 06/24/2014 0407   GLUCOSE 86 11/01/2011 2116   BUN 13 06/24/2014 0407   BUN 12 11/01/2011 2116   CREATININE 0.64 06/24/2014 0407   CREATININE 0.66 11/01/2011 2116   CALCIUM 7.9* 06/24/2014 0407   CALCIUM 8.7 11/01/2011 2116   PROT 7.0 06/23/2014 1105   PROT 8.4* 11/01/2011 2116   ALBUMIN 3.7 06/23/2014 1105   ALBUMIN 4.5 11/01/2011 2116   AST 21 06/23/2014 1105   AST 15 11/01/2011 2116   ALT 14 06/23/2014 1105   ALT 16 11/01/2011 2116   ALKPHOS 43 06/23/2014 1105   ALKPHOS 76 11/01/2011 2116   BILITOT 0.9 06/23/2014 1105   GFRNONAA >60 06/24/2014 0407   GFRNONAA >60 11/01/2011 2116   GFRAA >60 06/24/2014 0407   GFRAA >60 11/01/2011 2116    GFR Estimated Creatinine Clearance: 99.3 mL/min (by C-G formula based on Cr of 0.64). No results for input(s): LIPASE, AMYLASE in the last 72 hours. No results for input(s): CKTOTAL, CKMB, CKMBINDEX, TROPONINI in the last 72 hours. Invalid input(s): POCBNP No results for input(s): DDIMER in the last 72 hours. No results for input(s): HGBA1C in the last 72 hours. No results for input(s): CHOL, HDL, LDLCALC, TRIG, CHOLHDL, LDLDIRECT in the last 72 hours.  Recent Labs  06/24/14 0848  TSH 0.534   No results for input(s): VITAMINB12, FOLATE, FERRITIN, TIBC, IRON, RETICCTPCT in the last 72 hours. Coags: No results for input(s): INR in the last 72 hours.  Invalid input(s): PT Microbiology: Recent Results (from the past 240 hour(s))  MRSA PCR Screening     Status: None   Collection Time: 06/23/14  6:30 PM  Result Value Ref Range Status   MRSA by PCR NEGATIVE NEGATIVE Final    Comment:        The GeneXpert MRSA Assay (FDA approved for NASAL specimens only), is one component of a comprehensive MRSA colonization surveillance program. It is not intended to diagnose  MRSA infection nor to guide or monitor treatment for MRSA infections.      BRIEF HOSPITAL COURSE:  Drug overdose: Overdose with Seroquel, claims she just wanted to sleep as she was tired and had no intention of committing suicide.. On admission was very drowsy, however with supportive measures she was back to her baseline. Poison control was consulted, supportive care and telemetry monitoring was recommended. Psychiatry was subsequently consulted, who cleared the patient for discharge when medically ready. Psychiatry recommended to discontinue safety sitter. Outpatient psychiatry resources were recommended. Patient was resumed on Paxil and cracks on. However later during the stay, patient signed out AGAINST MEDICAL ADVICE.  Acute toxic encephalopathy:resolved. Secondary to above.  Sinus bradycardia: Mild sinus bradycardia especially when sleeping. TSH was normal. Good chronotropic response when awaken and with minimal movement. Per poison control, this is not expected with Seroquel. Plans were to continue to monitor for another  24 hours prior to discharge, however patient signed out AGAINST MEDICAL ADVICE.   TODAY-DAY OF DISCHARGE:  Subjective:   Kristen Schneider signed out AMA on 5/30  Objective:   Blood pressure 130/77, pulse 50, temperature 98.3 F (36.8 C), temperature source Oral, resp. rate 14, height 5\' 5"  (1.651 m), weight 64.5 kg (142 lb 3.2 oz), last menstrual period 06/16/2014, SpO2 100 %. No intake or output data in the 24 hours ending 06/26/14 1608 Filed Weights   06/23/14 1805  Weight: 64.5 kg (142 lb 3.2 oz)    DISCHARGE CONDITION: Stable  DISPOSITION:Home-however signed out AGAINST MEDICAL ADVICE      Signed: Jeoffrey MassedGHIMIRE,SHANKER 06/26/2014 4:08 PM

## 2014-12-01 ENCOUNTER — Emergency Department (HOSPITAL_COMMUNITY)
Admission: EM | Admit: 2014-12-01 | Discharge: 2014-12-01 | Disposition: A | Discharge status: Home / Self Care | Payer: Self-pay | Attending: Emergency Medicine | Admitting: Emergency Medicine

## 2014-12-01 ENCOUNTER — Encounter (HOSPITAL_COMMUNITY): Payer: Self-pay | Admitting: *Deleted

## 2014-12-01 DIAGNOSIS — Z8739 Personal history of other diseases of the musculoskeletal system and connective tissue: Secondary | ICD-10-CM | POA: Insufficient documentation

## 2014-12-01 DIAGNOSIS — F419 Anxiety disorder, unspecified: Secondary | ICD-10-CM | POA: Insufficient documentation

## 2014-12-01 DIAGNOSIS — Z8742 Personal history of other diseases of the female genital tract: Secondary | ICD-10-CM | POA: Insufficient documentation

## 2014-12-01 DIAGNOSIS — Z8619 Personal history of other infectious and parasitic diseases: Secondary | ICD-10-CM | POA: Insufficient documentation

## 2014-12-01 DIAGNOSIS — Z720 Tobacco use: Secondary | ICD-10-CM | POA: Insufficient documentation

## 2014-12-01 DIAGNOSIS — Z79899 Other long term (current) drug therapy: Secondary | ICD-10-CM | POA: Insufficient documentation

## 2014-12-01 DIAGNOSIS — R197 Diarrhea, unspecified: Secondary | ICD-10-CM | POA: Insufficient documentation

## 2014-12-01 DIAGNOSIS — R112 Nausea with vomiting, unspecified: Secondary | ICD-10-CM | POA: Insufficient documentation

## 2014-12-01 NOTE — ED Notes (Signed)
Declined W/C at D/C and was escorted to lobby by RN. 

## 2014-12-01 NOTE — ED Notes (Signed)
Pt c/o n/v/d onset yesterday with x 2 vomiting episodes & x2 diarrhea episodes, symptoms have resolved at this time, denies pain, pt reports her family recently having GI virus, pt states, "i just need a note for work. I went back today & didn't feel the greatest & my boss told me if I left that I needed a doctor's note." A&O x4

## 2014-12-01 NOTE — ED Provider Notes (Signed)
CSN: [id number]     Arrival date & time 12/01/14  1058 History  By signing my name below, I, Kristen Schneider, attest that this documentation has been prepared under the direction and in the presence of Fayrene HelperBowie Charlese Gruetzmacher, PA-C. Electronically Signed: Octavia HeirArianna Schneider, ED Scribe. 12/01/2014. 12:34 PM.    Chief Complaint  Patient presents with  . Nausea      The history is provided by the patient. No language interpreter was used.   HPI Comments: Kristen Schneider is a 24 y.o. female who presents to the Emergency Department complaining of constant, gradual improving nausea onset yesterday. Pt states she vomited and had diarrhea twice yesterday. She has been around her niece who had a stomach bug. Pt states she feels a lot better today than she did yesterday and reports wanting a work note. Pt denies hematemesis and blood in stools.  Past Medical History  Diagnosis Date  . Torn rotator cuff left shoulder  . Shingles 2006  . Polysubstance abuse   . Abnormal menstrual periods   . Anxiety    Past Surgical History  Procedure Laterality Date  . Tonsillectomy      24 y/o   . Wisdom tooth extraction      24 y/o   . Polypectomy      As a child multiple colonoscopy's for polyp removal  . Appendectomy    . Laparoscopic appendectomy N/A 05/23/2014    Procedure: APPENDECTOMY LAPAROSCOPIC;  Surgeon: Harriette Bouillonhomas Cornett, MD;  [facility];  Service: General;  Laterality: N/A;  . Laparoscopic lysis of adhesions N/A 05/23/2014    Procedure: LAPAROSCOPIC LYSIS OF ADHESIONS;  Surgeon: Harriette Bouillonhomas Cornett, MD;  Location: [facility] OR;  Service: General;  Laterality: N/A;   Family History  Problem Relation Age of Onset  . Alcoholism Father    Social History  Substance Use Topics  . Smoking status: Current Every Day Smoker -- 1.00 packs/day    Types: Cigarettes  . Smokeless tobacco: Former NeurosurgeonUser  . Alcohol Use: No   OB History    No data available     Review of Systems  Constitutional: Negative for fever.   Gastrointestinal: Positive for nausea, vomiting and diarrhea. Negative for blood in stool.      Allergies  Dilaudid  Home Medications   Prior to Admission medications   Medication Sig Start Date End Date Taking? Authorizing Provider  docusate sodium (COLACE) 100 MG capsule Take 1 capsule (100 mg total) by mouth 2 (two) times daily. Patient not taking: Reported on 06/24/2014 05/24/14   Nonie HoyerMegan N Baird, PA-C  esomeprazole (NEXIUM) 40 MG capsule Take 1 capsule (40 mg total) by mouth daily. 05/29/14   Rolland PorterMark James, MD  gabapentin (NEURONTIN) 300 MG capsule Take 300 mg by mouth 3 (three) times daily.    Historical Provider, MD  naltrexone (DEPADE) 50 MG tablet Take 0.5 tablets (25 mg total) by mouth daily. For alcoholism.opioid dependence Patient not taking: Reported on 06/24/2014 02/04/14   Sanjuana KavaAgnes I Nwoko, NP  oxyCODONE-acetaminophen (PERCOCET/ROXICET) 5-325 MG per tablet Take 1-2 tablets by mouth every 6 (six) hours as needed for moderate pain. Patient not taking: Reported on 06/24/2014 05/24/14   Nonie HoyerMegan N Baird, PA-C  PARoxetine (PAXIL) 10 MG tablet Take 1 tablet (10 mg total) by mouth at bedtime. For depression 02/04/14   Sanjuana KavaAgnes I Nwoko, NP  polyethylene glycol powder (GLYCOLAX/MIRALAX) powder Take 17 g by mouth daily. 05/29/14   Rolland PorterMark James, MD  QUEtiapine (SEROQUEL) 25 MG tablet Take 1 tablet (  25 mg total) by mouth 2 (two) times daily as needed (Anxiety). 02/04/14   Sanjuana Kava, NP   Triage vitals: BP 111/56 mmHg  Pulse 78  Temp(Src) 98.1 F (36.7 C) (Oral)  Resp 18  Ht 5' 5.5" (1.664 m)  Wt 144 lb (65.318 kg)  BMI 23.59 kg/m2  SpO2 97%  LMP 11/13/2014 Physical Exam  Constitutional: She appears well-developed and well-nourished. No distress.  HENT:  Head: Normocephalic and atraumatic.  Oral mucosa is moist  Eyes: Right eye exhibits no discharge. Left eye exhibits no discharge.  Pulmonary/Chest: Effort normal. No respiratory distress.  Abdominal: Soft. There is no tenderness.  Neurological:  She is alert. Coordination normal.  Skin: No rash noted. She is not diaphoretic.  Psychiatric: She has a normal mood and affect. Her behavior is normal.  Nursing note and vitals reviewed.   ED Course  Procedures  DIAGNOSTIC STUDIES: Oxygen Saturation is 97% on RA, normal by my interpretation.  COORDINATION OF CARE:  12:33 PM sxs suggestive of GI bug, doubt pregnancy.  sxs resolved.  Discussed treatment plan with pt at bedside and pt agreed to plan.     MDM   Final diagnoses:  Nausea vomiting and diarrhea    BP 111/56 mmHg  Pulse 78  Temp(Src) 98.1 F (36.7 C) (Oral)  Resp 18  Ht 5' 5.5" (1.664 m)  Wt 144 lb (65.318 kg)  BMI 23.59 kg/m2  SpO2 97%  LMP 11/13/2014  I personally performed the services described in this documentation, which was scribed in my presence. The recorded information has been reviewed and is accurate.     Fayrene Helper, PA-C 12/01/14 1300  Eber Hong, MD 12/01/14 [phone]

## 2014-12-01 NOTE — Discharge Instructions (Signed)

## 2015-04-16 ENCOUNTER — Inpatient Hospital Stay (HOSPITAL_COMMUNITY)
Admission: EM | Admit: 2015-04-16 | Discharge: 2015-04-22 | DRG: 917 | Disposition: A | Discharge status: Other Acute Inpatient Hospital | Payer: Self-pay | Attending: Internal Medicine | Admitting: Internal Medicine

## 2015-04-16 ENCOUNTER — Encounter (HOSPITAL_COMMUNITY): Payer: Self-pay | Admitting: *Deleted

## 2015-04-16 ENCOUNTER — Emergency Department (HOSPITAL_COMMUNITY): Payer: Self-pay

## 2015-04-16 DIAGNOSIS — E872 Acidosis: Secondary | ICD-10-CM | POA: Diagnosis present

## 2015-04-16 DIAGNOSIS — I4581 Long QT syndrome: Secondary | ICD-10-CM | POA: Diagnosis present

## 2015-04-16 DIAGNOSIS — T443X2A Poisoning by other parasympatholytics [anticholinergics and antimuscarinics] and spasmolytics, intentional self-harm, initial encounter: Secondary | ICD-10-CM | POA: Diagnosis present

## 2015-04-16 DIAGNOSIS — F1210 Cannabis abuse, uncomplicated: Secondary | ICD-10-CM | POA: Diagnosis present

## 2015-04-16 DIAGNOSIS — R569 Unspecified convulsions: Secondary | ICD-10-CM | POA: Diagnosis present

## 2015-04-16 DIAGNOSIS — T43592A Poisoning by other antipsychotics and neuroleptics, intentional self-harm, initial encounter: Secondary | ICD-10-CM | POA: Diagnosis present

## 2015-04-16 DIAGNOSIS — F1024 Alcohol dependence with alcohol-induced mood disorder: Secondary | ICD-10-CM | POA: Diagnosis present

## 2015-04-16 DIAGNOSIS — Z79899 Other long term (current) drug therapy: Secondary | ICD-10-CM

## 2015-04-16 DIAGNOSIS — T50904A Poisoning by unspecified drugs, medicaments and biological substances, undetermined, initial encounter: Secondary | ICD-10-CM

## 2015-04-16 DIAGNOSIS — T43222A Poisoning by selective serotonin reuptake inhibitors, intentional self-harm, initial encounter: Principal | ICD-10-CM | POA: Diagnosis present

## 2015-04-16 DIAGNOSIS — F17210 Nicotine dependence, cigarettes, uncomplicated: Secondary | ICD-10-CM | POA: Diagnosis present

## 2015-04-16 DIAGNOSIS — F319 Bipolar disorder, unspecified: Secondary | ICD-10-CM | POA: Diagnosis present

## 2015-04-16 DIAGNOSIS — F1110 Opioid abuse, uncomplicated: Secondary | ICD-10-CM | POA: Diagnosis present

## 2015-04-16 DIAGNOSIS — J9600 Acute respiratory failure, unspecified whether with hypoxia or hypercapnia: Secondary | ICD-10-CM

## 2015-04-16 DIAGNOSIS — Z885 Allergy status to narcotic agent status: Secondary | ICD-10-CM

## 2015-04-16 DIAGNOSIS — Z811 Family history of alcohol abuse and dependence: Secondary | ICD-10-CM

## 2015-04-16 DIAGNOSIS — N390 Urinary tract infection, site not specified: Secondary | ICD-10-CM | POA: Diagnosis present

## 2015-04-16 DIAGNOSIS — F419 Anxiety disorder, unspecified: Secondary | ICD-10-CM | POA: Diagnosis present

## 2015-04-16 DIAGNOSIS — J9601 Acute respiratory failure with hypoxia: Secondary | ICD-10-CM | POA: Diagnosis present

## 2015-04-16 DIAGNOSIS — E876 Hypokalemia: Secondary | ICD-10-CM | POA: Diagnosis present

## 2015-04-16 DIAGNOSIS — T50902A Poisoning by unspecified drugs, medicaments and biological substances, intentional self-harm, initial encounter: Secondary | ICD-10-CM

## 2015-04-16 DIAGNOSIS — Y903 Blood alcohol level of 60-79 mg/100 ml: Secondary | ICD-10-CM | POA: Diagnosis present

## 2015-04-16 LAB — BLOOD GAS, ARTERIAL
Acid-base deficit: 3.7 mmol/L — ABNORMAL HIGH (ref 0.0–2.0)
BICARBONATE: 20.8 meq/L (ref 20.0–24.0)
DRAWN BY: 257701
FIO2: 0.8
LHR: 24 {breaths}/min
MECHVT: 500 mL
O2 Saturation: 97.9 %
PATIENT TEMPERATURE: 98.6
PEEP: 5 cmH2O
PO2 ART: 156 mmHg — AB (ref 80.0–100.0)
TCO2: 19 mmol/L (ref 0–100)
pCO2 arterial: 38.1 mmHg (ref 35.0–45.0)
pH, Arterial: 7.357 (ref 7.350–7.450)

## 2015-04-16 LAB — ACETAMINOPHEN LEVEL: Acetaminophen (Tylenol), Serum: <10 ug/mL — ABNORMAL LOW (ref 10–30)

## 2015-04-16 LAB — HEPATIC FUNCTION PANEL
ALK PHOS: 54 U/L (ref 38–126)
ALT: 17 U/L (ref 14–54)
AST: 28 U/L (ref 15–41)
Albumin: 3.5 g/dL (ref 3.5–5.0)
BILIRUBIN INDIRECT: 0.9 mg/dL (ref 0.3–0.9)
BILIRUBIN TOTAL: 1 mg/dL (ref 0.3–1.2)
Bilirubin, Direct: 0.1 mg/dL (ref 0.1–0.5)
TOTAL PROTEIN: 6.1 g/dL — AB (ref 6.5–8.1)

## 2015-04-16 LAB — CBC WITH DIFFERENTIAL/PLATELET
Basophils Absolute: 0 10*3/uL (ref 0.0–0.1)
Basophils Relative: 0 %
Eosinophils Absolute: 0 10*3/uL (ref 0.0–0.7)
Eosinophils Relative: 0 %
HCT: 36.8 % (ref 36.0–46.0)
Hemoglobin: 11.8 g/dL — ABNORMAL LOW (ref 12.0–15.0)
Lymphocytes Relative: 8 %
Lymphs Abs: 1 10*3/uL (ref 0.7–4.0)
MCH: 29.8 pg (ref 26.0–34.0)
MCHC: 32.1 g/dL (ref 30.0–36.0)
MCV: 92.9 fL (ref 78.0–100.0)
Monocytes Absolute: 0.5 10*3/uL (ref 0.1–1.0)
Monocytes Relative: 5 %
Neutro Abs: 10.1 10*3/uL — ABNORMAL HIGH (ref 1.7–7.7)
Neutrophils Relative %: 87 %
Platelets: 258 10*3/uL (ref 150–400)
RBC: 3.96 MIL/uL (ref 3.87–5.11)
RDW: 17 % — ABNORMAL HIGH (ref 11.5–15.5)
WBC: 11.6 10*3/uL — ABNORMAL HIGH (ref 4.0–10.5)

## 2015-04-16 LAB — RAPID URINE DRUG SCREEN, HOSP PERFORMED
AMPHETAMINES: NOT DETECTED
Barbiturates: NOT DETECTED
Benzodiazepines: NOT DETECTED
Cocaine: NOT DETECTED
Opiates: POSITIVE — AB
TETRAHYDROCANNABINOL: POSITIVE — AB

## 2015-04-16 LAB — MAGNESIUM
MAGNESIUM: 1.8 mg/dL (ref 1.7–2.4)
Magnesium: 1.5 mg/dL — ABNORMAL LOW (ref 1.7–2.4)

## 2015-04-16 LAB — URINALYSIS, ROUTINE W REFLEX MICROSCOPIC
Bilirubin Urine: NEGATIVE
Glucose, UA: NEGATIVE mg/dL
HGB URINE DIPSTICK: NEGATIVE
Ketones, ur: NEGATIVE mg/dL
Leukocytes, UA: NEGATIVE
Nitrite: NEGATIVE
Protein, ur: NEGATIVE mg/dL
SPECIFIC GRAVITY, URINE: 1.005 (ref 1.005–1.030)
pH: 7 (ref 5.0–8.0)

## 2015-04-16 LAB — COMPREHENSIVE METABOLIC PANEL
ALT: 14 U/L (ref 14–54)
ANION GAP: 10 (ref 5–15)
AST: 19 U/L (ref 15–41)
Albumin: 3.6 g/dL (ref 3.5–5.0)
Alkaline Phosphatase: 52 U/L (ref 38–126)
BILIRUBIN TOTAL: 0.4 mg/dL (ref 0.3–1.2)
BUN: 9 mg/dL (ref 6–20)
CALCIUM: 7.8 mg/dL — AB (ref 8.9–10.3)
CO2: 22 mmol/L (ref 22–32)
Chloride: 106 mmol/L (ref 101–111)
Creatinine, Ser: 0.61 mg/dL (ref 0.44–1.00)
GFR calc Af Amer: >60 mL/min (ref >60)
Glucose, Bld: 99 mg/dL (ref 65–99)
POTASSIUM: 3.1 mmol/L — AB (ref 3.5–5.1)
Sodium: 138 mmol/L (ref 135–145)
TOTAL PROTEIN: 6.1 g/dL — AB (ref 6.5–8.1)

## 2015-04-16 LAB — AMYLASE: Amylase: 37 U/L (ref 28–100)

## 2015-04-16 LAB — PROTIME-INR
INR: 1.18 (ref 0.00–1.49)
PROTHROMBIN TIME: 14.7 s (ref 11.6–15.2)

## 2015-04-16 LAB — RENAL FUNCTION PANEL
Albumin: 3.7 g/dL (ref 3.5–5.0)
Anion gap: 11 (ref 5–15)
BUN: 5 mg/dL — AB (ref 6–20)
CALCIUM: 8 mg/dL — AB (ref 8.9–10.3)
CHLORIDE: 112 mmol/L — AB (ref 101–111)
CO2: 20 mmol/L — AB (ref 22–32)
CREATININE: 0.62 mg/dL (ref 0.44–1.00)
GFR calc Af Amer: >60 mL/min (ref >60)
GFR calc non Af Amer: >60 mL/min (ref >60)
GLUCOSE: 110 mg/dL — AB (ref 65–99)
Phosphorus: 2.5 mg/dL (ref 2.5–4.6)
Potassium: 2.9 mmol/L — ABNORMAL LOW (ref 3.5–5.1)
SODIUM: 143 mmol/L (ref 135–145)

## 2015-04-16 LAB — LIPASE, BLOOD: LIPASE: 28 U/L (ref 11–51)

## 2015-04-16 LAB — ETHANOL: Alcohol, Ethyl (B): 24 mg/dL — ABNORMAL HIGH (ref <5)

## 2015-04-16 LAB — MRSA PCR SCREENING: MRSA by PCR: NEGATIVE

## 2015-04-16 LAB — PHOSPHORUS: PHOSPHORUS: 2.1 mg/dL — AB (ref 2.5–4.6)

## 2015-04-16 LAB — SALICYLATE LEVEL

## 2015-04-16 LAB — STREP PNEUMONIAE URINARY ANTIGEN: Strep Pneumo Urinary Antigen: NEGATIVE

## 2015-04-16 LAB — APTT: APTT: 26 s (ref 24–37)

## 2015-04-16 LAB — LACTIC ACID, PLASMA: Lactic Acid, Venous: 1.9 mmol/L (ref 0.5–2.0)

## 2015-04-16 LAB — CBG MONITORING, ED: Glucose-Capillary: 111 mg/dL — ABNORMAL HIGH (ref 65–99)

## 2015-04-16 MED ORDER — PROPOFOL 10 MG/ML IV BOLUS
0.5000 mg/kg | Freq: Once | INTRAVENOUS | Status: AC
  Administered 2015-04-16: 36.3 mg via INTRAVENOUS

## 2015-04-16 MED ORDER — FENTANYL CITRATE (PF) 100 MCG/2ML IJ SOLN
25.0000 ug | INTRAMUSCULAR | Status: DC | PRN
Start: 2015-04-16 — End: 2015-04-17
  Administered 2015-04-16 – 2015-04-17 (×2): 100 ug via INTRAVENOUS
  Filled 2015-04-16 (×2): qty 2

## 2015-04-16 MED ORDER — HEPARIN SODIUM (PORCINE) 5000 UNIT/ML IJ SOLN
5000.0000 [IU] | Freq: Three times a day (TID) | INTRAMUSCULAR | Status: DC
  Administered 2015-04-16 – 2015-04-22 (×18): 5000 [IU] via SUBCUTANEOUS
  Filled 2015-04-16 (×23): qty 1

## 2015-04-16 MED ORDER — SODIUM CHLORIDE 0.9 % IV BOLUS (SEPSIS)
1000.0000 mL | Freq: Once | INTRAVENOUS | Status: AC
  Administered 2015-04-16: 1000 mL via INTRAVENOUS

## 2015-04-16 MED ORDER — MIDAZOLAM HCL 2 MG/2ML IJ SOLN
2.0000 mg | INTRAMUSCULAR | Status: DC | PRN
  Administered 2015-04-16 – 2015-04-17 (×3): 2 mg via INTRAVENOUS
  Filled 2015-04-16 (×3): qty 2

## 2015-04-16 MED ORDER — SUCCINYLCHOLINE CHLORIDE 20 MG/ML IJ SOLN
100.0000 mg | Freq: Once | INTRAMUSCULAR | Status: AC
  Administered 2015-04-16: 100 mg via INTRAVENOUS

## 2015-04-16 MED ORDER — CHLORHEXIDINE GLUCONATE 0.12% ORAL RINSE (MEDLINE KIT)
15.0000 mL | Freq: Two times a day (BID) | OROMUCOSAL | Status: DC
  Administered 2015-04-17: 15 mL via OROMUCOSAL

## 2015-04-16 MED ORDER — MAGNESIUM SULFATE 4 GM/100ML IV SOLN
4.0000 g | Freq: Once | INTRAVENOUS | Status: AC
  Administered 2015-04-16: 4 g via INTRAVENOUS
  Filled 2015-04-16: qty 100

## 2015-04-16 MED ORDER — PROPOFOL 1000 MG/100ML IV EMUL
5.0000 ug/kg/min | INTRAVENOUS | Status: DC
  Administered 2015-04-16: 5 ug/kg/min via INTRAVENOUS

## 2015-04-16 MED ORDER — IPRATROPIUM-ALBUTEROL 0.5-2.5 (3) MG/3ML IN SOLN
3.0000 mL | Freq: Four times a day (QID) | RESPIRATORY_TRACT | Status: DC
  Administered 2015-04-16 – 2015-04-17 (×4): 3 mL via RESPIRATORY_TRACT
  Filled 2015-04-16 (×4): qty 3

## 2015-04-16 MED ORDER — PROPOFOL 1000 MG/100ML IV EMUL
5.0000 ug/kg/min | INTRAVENOUS | Status: DC
  Administered 2015-04-16 – 2015-04-17 (×2): 5 ug/kg/min via INTRAVENOUS
  Filled 2015-04-16: qty 100

## 2015-04-16 MED ORDER — POTASSIUM CHLORIDE 20 MEQ/15ML (10%) PO SOLN
40.0000 meq | Freq: Every day | ORAL | Status: DC
  Administered 2015-04-16 – 2015-04-17 (×2): 40 meq via ORAL
  Filled 2015-04-16 (×2): qty 30

## 2015-04-16 MED ORDER — NALOXONE HCL 2 MG/2ML IJ SOSY
1.0000 mg | PREFILLED_SYRINGE | INTRAMUSCULAR | Status: DC | PRN
  Administered 2015-04-16: 0.8 mg via INTRAVENOUS
  Filled 2015-04-16: qty 2

## 2015-04-16 MED ORDER — ANTISEPTIC ORAL RINSE SOLUTION (CORINZ)
7.0000 mL | Freq: Four times a day (QID) | OROMUCOSAL | Status: DC
  Administered 2015-04-16 – 2015-04-17 (×2): 7 mL via OROMUCOSAL

## 2015-04-16 MED ORDER — NALOXONE HCL 0.4 MG/ML IJ SOLN
0.4000 mg | Freq: Once | INTRAMUSCULAR | Status: AC
  Administered 2015-04-16: 0.4 mg via INTRAVENOUS

## 2015-04-16 MED ORDER — SODIUM CHLORIDE 0.9 % IV SOLN
INTRAVENOUS | Status: DC
  Administered 2015-04-16 – 2015-04-17 (×3): via INTRAVENOUS

## 2015-04-16 MED ORDER — NALOXONE HCL 2 MG/2ML IJ SOSY
PREFILLED_SYRINGE | INTRAMUSCULAR | Status: AC
  Administered 2015-04-16: 0.8 mg
  Filled 2015-04-16: qty 2

## 2015-04-16 MED ORDER — FOLIC ACID 5 MG/ML IJ SOLN
1.0000 mg | Freq: Every day | INTRAMUSCULAR | Status: DC
  Administered 2015-04-16 – 2015-04-17 (×2): 1 mg via INTRAVENOUS
  Filled 2015-04-16 (×3): qty 0.2

## 2015-04-16 MED ORDER — ETOMIDATE 2 MG/ML IV SOLN
20.0000 mg | Freq: Once | INTRAVENOUS | Status: AC
  Administered 2015-04-16: 20 mg via INTRAVENOUS

## 2015-04-16 MED ORDER — POTASSIUM CHLORIDE 10 MEQ/100ML IV SOLN
10.0000 meq | INTRAVENOUS | Status: AC
  Administered 2015-04-16 – 2015-04-17 (×6): 10 meq via INTRAVENOUS
  Filled 2015-04-16 (×6): qty 100

## 2015-04-16 MED ORDER — PANTOPRAZOLE SODIUM 40 MG IV SOLR
40.0000 mg | Freq: Every day | INTRAVENOUS | Status: DC
  Administered 2015-04-16: 40 mg via INTRAVENOUS
  Filled 2015-04-16: qty 40

## 2015-04-16 MED ORDER — PROPOFOL 1000 MG/100ML IV EMUL
INTRAVENOUS | Status: AC
  Filled 2015-04-16: qty 100

## 2015-04-16 MED ORDER — THIAMINE HCL 100 MG/ML IJ SOLN
100.0000 mg | Freq: Every day | INTRAMUSCULAR | Status: DC
  Administered 2015-04-16 – 2015-04-17 (×2): 100 mg via INTRAVENOUS
  Filled 2015-04-16 (×2): qty 2

## 2015-04-16 MED ORDER — POTASSIUM CHLORIDE 20 MEQ/15ML (10%) PO SOLN
40.0000 meq | Freq: Once | ORAL | Status: AC
  Administered 2015-04-16: 40 meq
  Filled 2015-04-16: qty 30

## 2015-04-16 MED ORDER — POTASSIUM PHOSPHATES 15 MMOLE/5ML IV SOLN
10.0000 mmol | Freq: Once | INTRAVENOUS | Status: AC
  Administered 2015-04-17: 10 mmol via INTRAVENOUS
  Filled 2015-04-16: qty 3.33

## 2015-04-16 NOTE — ED Notes (Addendum)
Spoke to pt's father Kandra NicolasBrian Faerber and gave him an update on pt's status.  He started to tell this nurse about pt's psych hx and addiction problems and asked this nurse if we can help her with this.  He's reminded that at this time, pt is not medically cleared for psych evaluation as of yet.  He states "Oh I know, this is for in the future when she get out of this."  He's made ware of pt's status again and advised that he come to the ED.  He states "Oh I'm at the pain clinic right now getting my nerve block.  It probably won't be until 1 or 2'o clock before I can get there."

## 2015-04-16 NOTE — ED Notes (Signed)
Per EMS, pt's boyfriend received a text from pt this am with a picture of her taking her pills, attempting suicide.  EMS reports boyfriend reported to them that pt had taken her 6523 of her zyprexa, 23 of her cogentin, all of her prozac and smoked heroin with her boyfriend last night.  [facility] reported that pt was ambulatory when they arrived on scene, however, pt was lethargic when EMS arrived on scene.

## 2015-04-16 NOTE — ED Provider Notes (Signed)
CSN: [id number]     Arrival date & time 04/16/15  0932 History   First MD Initiated Contact with Patient 04/16/15 [phone]     Chief Complaint  Patient presents with  . Ingestion  . Suicide Attempt   LEVEL 5 CAVEAT - UNRESPONSIVE  (Consider location/radiation/quality/duration/timing/severity/associated sxs/prior Treatment) HPI  25 year old female presents after an intention overdose. History is taken from EMS due to patient's AMS. Patient reportedly took ~23 zyprexa, 23 cogentin, and 69 prozac based on EMS estimates. This was apparently a suicide attempt. Boyfriend called 911, apparently she was texting him photos of her ingesting these. Heroin and ETOH, unclear if last night or this AM. Was walking around when police arrived, lethargic when EMS arrived. They placed her on NRB. Glucose normal.   Past Medical History  Diagnosis Date  . Torn rotator cuff left shoulder  . Shingles 2006  . Polysubstance abuse   . Abnormal menstrual periods   . Anxiety    Past Surgical History  Procedure Laterality Date  . Tonsillectomy      25 y/o   . Wisdom tooth extraction      25 y/o   . Polypectomy      As a child multiple colonoscopy's for polyp removal  . Appendectomy    . Laparoscopic appendectomy N/A 05/23/2014    Procedure: APPENDECTOMY LAPAROSCOPIC;  Surgeon: Harriette Bouillonhomas Cornett, MD;  [facility];  Service: General;  Laterality: N/A;  . Laparoscopic lysis of adhesions N/A 05/23/2014    Procedure: LAPAROSCOPIC LYSIS OF ADHESIONS;  Surgeon: Harriette Bouillonhomas Cornett, MD;  Location: [facility] OR;  Service: General;  Laterality: N/A;   Family History  Problem Relation Age of Onset  . Alcoholism Father    Social History  Substance Use Topics  . Smoking status: Current Every Day Smoker -- 1.00 packs/day    Types: Cigarettes  . Smokeless tobacco: Former NeurosurgeonUser  . Alcohol Use: No   OB History    No data available     Review of Systems  Unable to perform ROS: Patient unresponsive      Allergies   Dilaudid  Home Medications   Prior to Admission medications   Medication Sig Start Date End Date Taking? Authorizing Provider  docusate sodium (COLACE) 100 MG capsule Take 1 capsule (100 mg total) by mouth 2 (two) times daily. Patient not taking: Reported on 06/24/2014 05/24/14   Nonie HoyerMegan N Baird, PA-C  esomeprazole (NEXIUM) 40 MG capsule Take 1 capsule (40 mg total) by mouth daily. 05/29/14   Rolland PorterMark James, MD  gabapentin (NEURONTIN) 300 MG capsule Take 300 mg by mouth 3 (three) times daily.    Historical Provider, MD  naltrexone (DEPADE) 50 MG tablet Take 0.5 tablets (25 mg total) by mouth daily. For alcoholism.opioid dependence Patient not taking: Reported on 06/24/2014 02/04/14   Sanjuana KavaAgnes I Nwoko, NP  oxyCODONE-acetaminophen (PERCOCET/ROXICET) 5-325 MG per tablet Take 1-2 tablets by mouth every 6 (six) hours as needed for moderate pain. Patient not taking: Reported on 06/24/2014 05/24/14   Nonie HoyerMegan N Baird, PA-C  PARoxetine (PAXIL) 10 MG tablet Take 1 tablet (10 mg total) by mouth at bedtime. For depression 02/04/14   Sanjuana KavaAgnes I Nwoko, NP  polyethylene glycol powder (GLYCOLAX/MIRALAX) powder Take 17 g by mouth daily. 05/29/14   Rolland PorterMark James, MD  QUEtiapine (SEROQUEL) 25 MG tablet Take 1 tablet (25 mg total) by mouth 2 (two) times daily as needed (Anxiety). 02/04/14   Sanjuana KavaAgnes I Nwoko, NP   There were no vitals taken for  this visit. Physical Exam  Constitutional: She appears well-developed and well-nourished.  HENT:  Head: Normocephalic and atraumatic.  Right Ear: External ear normal.  Left Ear: External ear normal.  Nose: Nose normal.  Eyes: Pupils are equal, round, and reactive to light. Right eye exhibits no discharge. Left eye exhibits no discharge.  Miotic pupils  Neck: Neck supple.  Cardiovascular: Regular rhythm and normal heart sounds.  Tachycardia present.   Pulmonary/Chest: Effort normal and breath sounds normal.  Abdominal: Soft. There is no tenderness.  Neurological: She is unresponsive. GCS eye  subscore is 1. GCS verbal subscore is 2. GCS motor subscore is 4.  Moves all 4 extremities but not to command  Skin: Skin is warm and dry. She is not diaphoretic.  Nursing note and vitals reviewed.   ED Course  .Intubation Date/Time: 04/16/2015 10:10 AM Performed by: Pricilla Loveless Authorized by: Pricilla Loveless Consent: The procedure was performed in an emergent situation. Patient identity confirmed: anonymous protocol, patient vented/unresponsive Time out: Immediately prior to procedure a "time out" was called to verify the correct patient, procedure, equipment, support staff and site/side marked as required. Indications: respiratory failure and  airway protection Intubation method: video-assisted Patient status: paralyzed (RSI) Preoxygenation: nonrebreather mask Sedatives: etomidate Paralytic: succinylcholine Tube size: 7.5 mm Tube type: cuffed Number of attempts: 1 Cricoid pressure: no Cords visualized: yes Post-procedure assessment: chest rise and CO2 detector Breath sounds: equal Cuff inflated: yes ETT to lip: 21 cm Tube secured with: ETT holder Chest x-ray interpreted by me. Chest x-ray findings: endotracheal tube in appropriate position Patient tolerance: Patient tolerated the procedure well with no immediate complications   (including critical care time) Labs Review Labs Reviewed  ACETAMINOPHEN LEVEL - Abnormal; Notable for the following:    Acetaminophen (Tylenol), Serum <10 (*)    All other components within normal limits  ETHANOL - Abnormal; Notable for the following:    Alcohol, Ethyl (B) 24 (*)    All other components within normal limits  CBG MONITORING, ED - Abnormal; Notable for the following:    Glucose-Capillary 111 (*)    All other components within normal limits  SALICYLATE LEVEL  MAGNESIUM  COMPREHENSIVE METABOLIC PANEL  CBC WITH DIFFERENTIAL/PLATELET  URINALYSIS, ROUTINE W REFLEX MICROSCOPIC (NOT AT [facility])  URINE RAPID DRUG SCREEN, HOSP  PERFORMED  PHOSPHORUS  LACTIC ACID, PLASMA  PROTIME-INR  APTT  STREP PNEUMONIAE URINARY ANTIGEN  BLOOD GAS, ARTERIAL  AMYLASE  LIPASE, BLOOD  CBC WITH DIFFERENTIAL/PLATELET  POC URINE PREG, ED    Imaging Review Dg Chest Portable 1 View  04/16/2015  CLINICAL DATA:  Overdose, endotracheal tube placement EXAM: PORTABLE CHEST 1 VIEW COMPARISON:  11/02/2011 FINDINGS: Cardiomediastinal silhouette is unremarkable. NG tube in place. Endotracheal tube in place with tip 3.7 cm above the carina. There is no pneumothorax. No infiltrate or pulmonary edema. IMPRESSION: No infiltrate or pulmonary edema. NG tube and endotracheal tube in place. No pneumothorax. Electronically Signed   By: Natasha Mead M.D.   On: 04/16/2015 10:24   I have personally reviewed and evaluated these images and lab results as part of my medical decision-making.   EKG Interpretation   Date/Time:  Wednesday April 16 2015 10:22:11 EDT Ventricular Rate:  115 PR Interval:    QRS Duration: 77 QT Interval:  363 QTC Calculation: 502 R Axis:   78 Text Interpretation:  Sinus tachycardia Borderline repolarization  abnormality Prolonged QT interval Confirmed by Jamaal Bernasconi  MD, Raven Furnas ([id number])  on 04/16/2015 10:40:29 AM  EKG Interpretation  Date/Time:  Wednesday April 16 2015 11:48:25 EDT Ventricular Rate:  99 PR Interval:  121 QRS Duration: 87 QT Interval:  388 QTC Calculation: 498 R Axis:   70 Text Interpretation:  Sinus rhythm Probable left atrial enlargement Prolonged QT interval no significant change from earlier in the day Confirmed by Particia Strahm  MD, Shravan Salahuddin ([id number]) on 04/16/2015 12:16:10 PM       CRITICAL CARE Performed by: Pricilla Loveless T   Total critical care time: 45 minutes  Critical care time was exclusive of separately billable procedures and treating other patients.  Critical care was necessary to treat or prevent imminent or life-threatening deterioration.  Critical care was time spent personally by me  on the following activities: development of treatment plan with patient and/or surrogate as well as nursing, discussions with consultants, evaluation of patient's response to treatment, examination of patient, obtaining history from patient or surrogate, ordering and performing treatments and interventions, ordering and review of laboratory studies, ordering and review of radiographic studies, pulse oximetry and re-evaluation of patient's condition.  MDM   Final diagnoses:  Acute respiratory failure, unspecified whether with hypoxia or hypercapnia (HCC)  Drug overdose, undetermined intent, initial encounter    Patient presents with acute OD and respiratory failure. Given a total of 2 mg narcan with minimal response. Moved arms some after this but will not wake up. Due to need to protect airway she was intubated. D/w Poison control, main concerns are hypo/hypertension, seizures, agitation and prolonged QTC. QTC at 500, will continue to monitor. On propofol for sedation. Updated boyfriend who is only family/friends here currently. Will need respiratory support in ICU. No lavage or bowel irrigation given unclear ingestion time.     Pricilla Loveless, MD 04/16/15 [phone]

## 2015-04-16 NOTE — ED Notes (Signed)
Pt's belongings sent with boy freind Production assistant, radiocott Nance.

## 2015-04-16 NOTE — Progress Notes (Signed)
eLink Physician-Brief Progress Note Patient Name: Kristen DashJessica K Schneider DOB: [birth year] MRN: [id number]   Date of Service  04/16/2015  HPI/Events of Note  Notified of hypokalemia 2.9 & hypomagnesemia 1.5  eICU Interventions  1. Magnesium sulfate 4 g IV 2. KCl 40 mEqV via tube 1 3. KCl 10 mEq IV x6 runs     Intervention Category Intermediate Interventions: Electrolyte abnormality - evaluation and management  Lawanda CousinsJennings Gregroy Dombkowski 04/16/2015, 9:29 PM

## 2015-04-16 NOTE — Progress Notes (Signed)
Utilization Review completed.  Keigo Whalley RN CM  

## 2015-04-16 NOTE — Progress Notes (Signed)
RT assisted with intubation- details in EPIC Flowsheet.

## 2015-04-16 NOTE — H&P (Signed)
PULMONARY / CRITICAL CARE MEDICINE   Name: Kristen Schneider MRN: [id number] DOB: Oct 05, 1990    ADMISSION DATE:  04/16/2015   REFERRING MD:  EDP  CHIEF COMPLAINT: OD  HISTORY OF PRESENT ILLNESS:   [name] with bipolar disorder, drinks and does heroin, who ingested  Prozac 68 tabs,cogentin 23 tabs, Zyprexa 23 tabs,  ETOH level was 67,+ for marijuana benzo and told EMS she was attempting suicide. Intubated for airway protection and PCCM called to admit.  PAST MEDICAL HISTORY :  She  has a past medical history of Torn rotator cuff (left shoulder); Shingles (2006); Polysubstance abuse; Abnormal menstrual periods; and Anxiety.  PAST SURGICAL HISTORY: She  has past surgical history that includes Tonsillectomy; Wisdom tooth extraction; Polypectomy; Appendectomy; laparoscopic appendectomy (N/A, 05/23/2014); and Laparoscopic lysis of adhesions (N/A, 05/23/2014).  Allergies  Allergen Reactions  . Dilaudid [Hydromorphone Hcl] Rash    Rash and itching    No current facility-administered medications on file prior to encounter.   Current Outpatient Prescriptions on File Prior to Encounter  Medication Sig  . docusate sodium (COLACE) 100 MG capsule Take 1 capsule (100 mg total) by mouth 2 (two) times daily. (Patient not taking: Reported on 06/24/2014)  . esomeprazole (NEXIUM) 40 MG capsule Take 1 capsule (40 mg total) by mouth daily.  Marland Kitchen gabapentin (NEURONTIN) 300 MG capsule Take 300 mg by mouth 3 (three) times daily.  . naltrexone (DEPADE) 50 MG tablet Take 0.5 tablets (25 mg total) by mouth daily. For alcoholism.opioid dependence (Patient not taking: Reported on 06/24/2014)  . oxyCODONE-acetaminophen (PERCOCET/ROXICET) 5-325 MG per tablet Take 1-2 tablets by mouth every 6 (six) hours as needed for moderate pain. (Patient not taking: Reported on 06/24/2014)  . PARoxetine (PAXIL) 10 MG tablet Take 1 tablet (10 mg total) by mouth at bedtime. For depression  . polyethylene glycol powder  (GLYCOLAX/MIRALAX) powder Take 17 g by mouth daily.  . QUEtiapine (SEROQUEL) 25 MG tablet Take 1 tablet (25 mg total) by mouth 2 (two) times daily as needed (Anxiety).    FAMILY HISTORY:  Her indicated that her mother is deceased. She indicated that her father is alive. She indicated that her sister is alive.   SOCIAL HISTORY: She  reports that she has been smoking Cigarettes.  She has been smoking about 1.00 pack per day. She has quit using smokeless tobacco. She reports that she does not drink alcohol or use illicit drugs.  REVIEW OF SYSTEMS:   NA  SUBJECTIVE:  Intubated  VITAL SIGNS: BP 136/97 mmHg  Pulse 113  Temp(Src) 95.9 F (35.5 C)  Resp 20  Wt 160 lb (72.576 kg)  SpO2 100%  HEMODYNAMICS:    VENTILATOR SETTINGS: Vent Mode:  [-] PRVC FiO2 (%):  [100 %] 100 % Set Rate:  [23 bmp] 23 bmp Vt Set:  [500 mL] 500 mL PEEP:  [5 cmH20] 5 cmH20 Plateau Pressure:  [19 cmH20] 19 cmH20  INTAKE / OUTPUT:    PHYSICAL EXAMINATION: General:  Sedated on vent Neuro:  Sedated, no follows commands, MAE x 4 HEENT:  PERL, No JVD/LAN Cardiovascular:  HSR RRR Lungs: CTA Abdomen: Soft + bs Musculoskeletal:  intact Skin: warm, no edema  LABS:  BMET No results for input(s): NA, K, CL, CO2, BUN, CREATININE, GLUCOSE in the last 168 hours.  Electrolytes No results for input(s): CALCIUM, MG, PHOS in the last 168 hours.  CBC No results for input(s): WBC, HGB, HCT, PLT in the last 168 hours.  Coag's No results for  input(s): APTT, INR in the last 168 hours.  Sepsis Markers No results for input(s): LATICACIDVEN, PROCALCITON, O2SATVEN in the last 168 hours.  ABG No results for input(s): PHART, PCO2ART, PO2ART in the last 168 hours.  Liver Enzymes No results for input(s): AST, ALT, ALKPHOS, BILITOT, ALBUMIN in the last 168 hours.  Cardiac Enzymes No results for input(s): TROPONINI, PROBNP in the last 168 hours.  Glucose  Recent Labs Lab 04/16/15 0945  GLUCAP 111*     Imaging Dg Chest Portable 1 View  04/16/2015  CLINICAL DATA:  Overdose, endotracheal tube placement EXAM: PORTABLE CHEST 1 VIEW COMPARISON:  11/02/2011 FINDINGS: Cardiomediastinal silhouette is unremarkable. NG tube in place. Endotracheal tube in place with tip 3.7 cm above the carina. There is no pneumothorax. No infiltrate or pulmonary edema. IMPRESSION: No infiltrate or pulmonary edema. NG tube and endotracheal tube in place. No pneumothorax. Electronically Signed   By: Natasha MeadLiviu  Pop M.D.   On: 04/16/2015 10:24     STUDIES:    CULTURES: None  ANTIBIOTICS: none  SIGNIFICANT EVENTS: 3/21 suicide attempt  LINES/TUBES: 3/22 ET in OR  DISCUSSION: 25 yo bipolar polysubtance OD  ASSESSMENT / PLAN:  PULMONARY A: VDRF secondary to polysubstance OD P:   Vent bundle  CARDIOVASCULAR A:  OD with polysubstances P:  Monitor for shock Monitor QT intervals Pressors as needed  RENAL A:   No acute issue  P:   Follow lytes and renal function  GASTROINTESTINAL A:   GI protection P:   PPI  HEMATOLOGIC A:   No acute issue P:  DVT protection  INFECTIOUS A:   No acute issue P:     ENDOCRINE A:   Monitor glucose  P:   cbg  NEUROLOGIC A:   Intubated and placed on Diprivan for AMS from drug ingestion as suicide attempt ? sz do Bipolar Suicide attempt  ETOH use  Heroin use  P:   RASS goal: -1 Psych consult [facility]  Thiamine and folic acid  FAMILY  - Updates: No family at bedside  - Inter-disciplinary family meet or Palliative Care meeting due by:  day 7    Steve Arzella Rehmann ACNP [facility] Pager [phone] till 3 pm If no answer page [phone] 04/16/2015, 10:40 AM

## 2015-04-16 NOTE — Progress Notes (Signed)
eLink Physician-Brief Progress Note Patient Name: Kristen DashJessica K Schneider DOB: 1990-08-28 MRN: [id number]   Date of Service  04/16/2015  HPI/Events of Note  Status post intentional overdose. Patient with hypokalemia.  eICU Interventions  1. KCl 40 mEq by tube 1 now 2. Checking renal panel, LFTs, magnesium, & phosphorus now     Intervention Category Intermediate Interventions: Electrolyte abnormality - evaluation and management  Lawanda CousinsJennings Aziz Slape 04/16/2015, 7:23 PM

## 2015-04-17 ENCOUNTER — Inpatient Hospital Stay (HOSPITAL_COMMUNITY)
Admit: 2015-04-17 | Discharge: 2015-04-17 | Disposition: A | Discharge status: Home / Self Care | Payer: MEDICAID | Attending: Emergency Medicine | Admitting: Emergency Medicine

## 2015-04-17 DIAGNOSIS — R4182 Altered mental status, unspecified: Secondary | ICD-10-CM

## 2015-04-17 LAB — BLOOD GAS, ARTERIAL
Acid-base deficit: 5.1 mmol/L — ABNORMAL HIGH (ref 0.0–2.0)
Acid-base deficit: 5.5 mmol/L — ABNORMAL HIGH (ref 0.0–2.0)
Bicarbonate: 15.6 mEq/L — ABNORMAL LOW (ref 20.0–24.0)
Bicarbonate: 16.2 mEq/L — ABNORMAL LOW (ref 20.0–24.0)
DRAWN BY: 308601
DRAWN BY: 331471
FIO2: 0.3
FIO2: 0.3
MECHVT: 500 mL
O2 SAT: 98 %
O2 Saturation: 98.8 %
PCO2 ART: 19.7 mmHg — AB (ref 35.0–45.0)
PEEP: 5 cmH2O
PEEP: 5 cmH2O
PO2 ART: 121 mmHg — AB (ref 80.0–100.0)
PO2 ART: 169 mmHg — AB (ref 80.0–100.0)
Patient temperature: 37.9
Patient temperature: 37
RATE: 24 resp/min
RATE: 16 resp/min
TCO2: 13.8 mmol/L (ref 0–100)
TCO2: 14.4 mmol/L (ref 0–100)
VT: 500 mL
pCO2 arterial: 22.3 mmHg — ABNORMAL LOW (ref 35.0–45.0)
pH, Arterial: 7.515 — ABNORMAL HIGH (ref 7.350–7.450)
pH, Arterial: 7.474 — ABNORMAL HIGH (ref 7.350–7.450)

## 2015-04-17 LAB — MAGNESIUM: MAGNESIUM: 2.6 mg/dL — AB (ref 1.7–2.4)

## 2015-04-17 LAB — RENAL FUNCTION PANEL
ALBUMIN: 3.3 g/dL — AB (ref 3.5–5.0)
Anion gap: 9 (ref 5–15)
CO2: 15 mmol/L — ABNORMAL LOW (ref 22–32)
CREATININE: 0.53 mg/dL (ref 0.44–1.00)
Calcium: 7.8 mg/dL — ABNORMAL LOW (ref 8.9–10.3)
Chloride: 112 mmol/L — ABNORMAL HIGH (ref 101–111)
Glucose, Bld: 106 mg/dL — ABNORMAL HIGH (ref 65–99)
PHOSPHORUS: 2.3 mg/dL — AB (ref 2.5–4.6)
POTASSIUM: 4.3 mmol/L (ref 3.5–5.1)
Sodium: 136 mmol/L (ref 135–145)

## 2015-04-17 LAB — CBC
HCT: 34.7 % — ABNORMAL LOW (ref 36.0–46.0)
HEMOGLOBIN: 11.7 g/dL — AB (ref 12.0–15.0)
MCH: 29.4 pg (ref 26.0–34.0)
MCHC: 33.7 g/dL (ref 30.0–36.0)
MCV: 87.2 fL (ref 78.0–100.0)
Platelets: 269 10*3/uL (ref 150–400)
RBC: 3.98 MIL/uL (ref 3.87–5.11)
RDW: 17.3 % — AB (ref 11.5–15.5)
WBC: 13.4 10*3/uL — ABNORMAL HIGH (ref 4.0–10.5)

## 2015-04-17 MED ORDER — CETYLPYRIDINIUM CHLORIDE 0.05 % MT LIQD: 7.0000 mL | Freq: Two times a day (BID) | OROMUCOSAL | Status: DC

## 2015-04-17 MED ORDER — DEXTROSE IN LACTATED RINGERS 5 % IV SOLN
INTRAVENOUS | Status: DC
  Administered 2015-04-17 – 2015-04-19 (×3): via INTRAVENOUS

## 2015-04-17 MED ORDER — VITAL HIGH PROTEIN PO LIQD: 1000.0000 mL | ORAL | Status: DC

## 2015-04-17 MED ORDER — VITAL AF 1.2 CAL PO LIQD
1000.0000 mL | ORAL | Status: DC
  Filled 2015-04-17: qty 1000

## 2015-04-17 NOTE — Procedures (Signed)
Extubation Procedure Note  Patient Details:   Name: Kristen Schneider DOB: 02-28-1990 MRN: [id number]   Airway Documentation:     Evaluation  O2 sats: stable throughout Complications: No apparent complications Patient did tolerate procedure well. Bilateral Breath Sounds: Clear, Diminished   Yes  Suzan GaribaldiCraddock, Ifeoma Vallin Ann 04/17/2015, 1:23 PM
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
## 2015-04-17 NOTE — Procedures (Signed)
ELECTROENCEPHALOGRAM REPORT  Date of Study: 04/17/2015  Patient's Name: Kristen DashJessica K Bogard MRN: [id number] Date of Birth: 1990/09/01  Referring Provider: Dr. Pricilla LovelessScott Goldston  Clinical History: This is a 25 year old woman admitted for altered mental status after multiple drug ingestion.   Medications: propofol (DIPRIVAN) 1000 MG/100ML infusion folic acid injection 1 mg ipratropium-albuterol (DUONEB) 0.5-2.5 (3) MG/3ML nebulizer solution 3 mL naloxone (NARCAN) injection 1 mg pantoprazole (PROTONIX) injection 40 mg thiamine (B-1) injection 100 mg  Technical Summary: A multichannel digital EEG recording measured by the international 10-20 system with electrodes applied with paste and impedances below 5000 ohms performed in our laboratory with EKG monitoring in an intubated and sedated patient.  Hyperventilation and photic stimulation were not performed.  The digital EEG was referentially recorded, reformatted, and digitally filtered in a variety of bipolar and referential montages for optimal display.    Description: The patient is intubated and sedated on Propofol during the recording. There is no clear posterior dominant rhythm. The background consists of sleep architecture with theta and delta slowing, vertex waves, and symmetric sleep spindles seen. Hyperventilation and photic stimulation were not performed. There was muscle artifact and slight increase in faster frequencies with stimulation.  There were no epileptiform discharges or electrographic seizures seen.    EKG lead was unremarkable.  Impression: This sedated EEG is normal with sedated sleep seen throughout the record.  Clinical Correlation: The absence of epileptiform discharges does not exclude a clinical diagnosis of epilepsy.  If further clinical questions remain, repeat EEG off sedation may be helpful.  Clinical correlation is advised.   Patrcia DollyKaren Aquino, M.D.

## 2015-04-17 NOTE — Progress Notes (Addendum)
Initial Nutrition Assessment  INTERVENTION:   If patient is expected to remain intubated for >24 hours, recommend nutrition support.  TF recommendations: Initiate Vital AF 1.2 @ 20 ml/hr via OGT and increase by 10 ml every 6 hours to goal rate of 65 ml/hr.  Tube feeding regimen provides 1872 kcal (94% of needs), 117 grams of protein, and 1265 ml of H2O.   RD to continue to monitor for plan  **Consult received to initiate TF per MD. TF protocol placed and adjusted to recommendations above.  NUTRITION DIAGNOSIS:   Inadequate oral intake related to inability to eat as evidenced by NPO status.  GOAL:   Patient will meet greater than or equal to 90% of their needs  MONITOR:   Vent status, Labs, Weight trends, I & O's  REASON FOR ASSESSMENT:   Ventilator    ASSESSMENT:   [name] with bipolar disorder, drinks and does heroin, who ingested Prozac 68 tabs,cogentin 23 tabs, Zyprexa 23 tabs, ETOH level was 67,+ for marijuana benzo and told EMS she was attempting suicide. Intubated for airway protection  Patient in room with no family at bedside. Pt admitted for OD. History of substance abuse(heroin) and ETOH use. Per weight history, pt's weight has increased since November 2016. Patient with no sign of depletion of muscle mass or body fat.  Patient is currently intubated on ventilator support MV: 12 L/min Temp (24hrs), Avg:99.6 F (37.6 C), Min:95.9 F (35.5 C), Max:101.1 F (38.4 C)  Propofol: 2.2 ml/hr -provides 58 fat kcal  Medications: IV folic acid, KCl PO, IV thiamine  Labs reviewed: Elevated Mg Low Phos  Diet Order:  Diet NPO time specified  Skin:  Reviewed, no issues  Last BM:  PTA  Height:   Ht Readings from Last 1 Encounters:  04/16/15 5\' 4"  (1.626 m)    Weight:   Wt Readings from Last 1 Encounters:  04/17/15 160 lb 4.4 oz (72.7 kg)    Ideal Body Weight:  54.5 kg  BMI:  Body mass index is 27.5 kg/(m^2).  Estimated Nutritional Needs:   Kcal:   1982  Protein:  105-115g  Fluid:  2L/day  EDUCATION NEEDS:   No education needs identified at this time  Tilda FrancoLindsey Earl Losee, MS, RD, LDN Pager: [phone] After Hours Pager: [phone]

## 2015-04-17 NOTE — Progress Notes (Signed)
Pt extubated at noon. Vent removed from room.  
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
## 2015-04-17 NOTE — Progress Notes (Signed)
eLink Physician-Brief Progress Note Patient Name: Kristen Schneider DOB: 1990/12/03 MRN: [id number]   Date of Service  04/17/2015  HPI/Events of Note  Nurse reports patient has clear voice and is requesting something to drink. Reportedly passed bedside swallow evaluation by nurse.  eICU Interventions  Clear liquid diet.     Intervention Category Intermediate Interventions: Other:  Lawanda CousinsJennings Tiari Andringa 04/17/2015, 8:40 PM

## 2015-04-17 NOTE — Progress Notes (Signed)
PULMONARY / CRITICAL CARE MEDICINE   Name: Kristen Schneider MRN: [id number] DOB: 1990/11/17    ADMISSION DATE:  04/16/2015   REFERRING MD:  EDP  CHIEF COMPLAINT: OD  HISTORY OF PRESENT ILLNESS:   [name] with bipolar disorder, drinks and does heroin, who ingested  Prozac 68 tabs,cogentin 23 tabs, Zyprexa 23 tabs,  ETOH level was 67,+ for marijuana benzo and told EMS she was attempting suicide. Intubated for airway protection and PCCM called to admit.   SUBJECTIVE:  Awake, no distress.   VITAL SIGNS: BP 120/78 mmHg  Pulse 93  Temp(Src) 99.5 F (37.5 C) (Core (Comment))  Resp 16  Ht  (1.626 m)  Wt 160 lb 4.4 oz (72.7 kg)  BMI 27.50 kg/m2  SpO2 99%  HEMODYNAMICS:    VENTILATOR SETTINGS: Vent Mode:  [-] PRVC FiO2 (%):  [30 %-70 %] 30 % Set Rate:  [16 bmp-24 bmp] 16 bmp Vt Set:  [500 mL] 500 mL PEEP:  [5 cmH20] 5 cmH20 Plateau Pressure:  [15 cmH20-21 cmH20] 21 cmH20  INTAKE / OUTPUT: I/O last 3 completed shifts: In: 3304.6 [I.V.:2391.3; NG/GT:60; IV Piggyback:853.3] Out: 3050 [Urine:2950; Emesis/NG output:100]  PHYSICAL EXAMINATION: General:  Now awake and alert, no focal def Neuro:  awake follows commands, MAE x 4 HEENT:  PERL, No JVD/LAN Cardiovascular:  HSR RRR Lungs: CTA, no accessory use  Abdomen: Soft + bs Musculoskeletal:  intact Skin: warm, no edema  LABS:  BMET  Recent Labs Lab 04/16/15 1046 04/16/15 2032 04/17/15 0312  NA 138 143 136  K 3.1* 2.9* 4.3  CL 106 112* 112*  CO2 22 20* 15*  BUN 9 5* <5*  CREATININE 0.61 0.62 0.53  GLUCOSE 99 110* 106*    Electrolytes  Recent Labs Lab 04/16/15 1046 04/16/15 1119 04/16/15 2032 04/17/15 0312  CALCIUM 7.8*  --  8.0* 7.8*  MG 1.8  --  1.5* 2.6*  PHOS  --  2.1* 2.5 2.3*    CBC  Recent Labs Lab 04/16/15 1122 04/17/15 0312  WBC 11.6* 13.4*  HGB 11.8* 11.7*  HCT 36.8 34.7*  PLT 258 269    Coag's  Recent Labs Lab 04/16/15 1119  APTT 26  INR 1.18    Sepsis  Markers  Recent Labs Lab 04/16/15 1119  LATICACIDVEN 1.9    ABG  Recent Labs Lab 04/16/15 1122 04/17/15 0330 04/17/15 1050  PHART 7.357 7.515* 7.474*  PCO2ART 38.1 19.7* 22.3*  PO2ART 156* 121* 169*    Liver Enzymes  Recent Labs Lab 04/16/15 1046 04/16/15 2032 04/17/15 0312  AST 19 28  --   ALT 14 17  --   ALKPHOS 52 54  --   BILITOT 0.4 1.0  --   ALBUMIN 3.6 3.5  3.7 3.3*    Cardiac Enzymes No results for input(s): TROPONINI, PROBNP in the last 168 hours.  Glucose  Recent Labs Lab 04/16/15 0945  GLUCAP 111*    Imaging No results found.   STUDIES:    CULTURES: None  ANTIBIOTICS: none  SIGNIFICANT EVENTS: 3/21 suicide attempt  LINES/TUBES: 3/22 ET in OR  DISCUSSION: 25 yo bipolar polysubtance OD; now awake and ready for extubation   ASSESSMENT / PLAN:  PULMONARY A: VDRF secondary to polysubstance OD P:   Vent bundle  CARDIOVASCULAR A:  OD with polysubstances Prolonged Qtc  P:  Cont IVFs Pressors as needed Cont serial Qtcs  RENAL A:   Mild NAG metabolic acidosis  P:   Change IVF to [facility]  Follow lytes and renal function  GASTROINTESTINAL A:   GI protection P:   PPI Adv diet as tol    NEUROLOGIC A:   Intubated and placed on Diprivan for AMS from drug ingestion as suicide attempt ? sz do Bipolar Suicide attempt  ETOH use  Heroin use  P:   RASS goal: -1 Psych consult [facility]  Thiamine and folic acid  FAMILY  - Updates: No family at bedside  - Inter-disciplinary family meet or Palliative Care meeting due by:  day 7    Simonne MartinetPeter E Babcock ACNP-BC [facility] Pulmonary/Critical Care Pager # [phone] OR # [phone] if no answer   04/17/2015, 12:59 PM

## 2015-04-17 NOTE — Progress Notes (Signed)
QTC - 0.50

## 2015-04-17 NOTE — Progress Notes (Signed)
QTC - .40

## 2015-04-17 NOTE — Progress Notes (Signed)
Offsite EEG completed at [facility]. Results pending. 

## 2015-04-18 DIAGNOSIS — T1491 Suicide attempt: Secondary | ICD-10-CM

## 2015-04-18 DIAGNOSIS — T43222A Poisoning by selective serotonin reuptake inhibitors, intentional self-harm, initial encounter: Secondary | ICD-10-CM | POA: Diagnosis not present

## 2015-04-18 DIAGNOSIS — T43592A Poisoning by other antipsychotics and neuroleptics, intentional self-harm, initial encounter: Secondary | ICD-10-CM | POA: Diagnosis not present

## 2015-04-18 DIAGNOSIS — R45851 Suicidal ideations: Secondary | ICD-10-CM

## 2015-04-18 DIAGNOSIS — T443X2A Poisoning by other parasympatholytics [anticholinergics and antimuscarinics] and spasmolytics, intentional self-harm, initial encounter: Secondary | ICD-10-CM

## 2015-04-18 LAB — CBC
HEMATOCRIT: 34.5 % — AB (ref 36.0–46.0)
HEMOGLOBIN: 11.4 g/dL — AB (ref 12.0–15.0)
MCH: 30.2 pg (ref 26.0–34.0)
MCHC: 33 g/dL (ref 30.0–36.0)
MCV: 91.5 fL (ref 78.0–100.0)
Platelets: 243 10*3/uL (ref 150–400)
RBC: 3.77 MIL/uL — AB (ref 3.87–5.11)
RDW: 17.7 % — ABNORMAL HIGH (ref 11.5–15.5)
WBC: 8.2 10*3/uL (ref 4.0–10.5)

## 2015-04-18 LAB — COMPREHENSIVE METABOLIC PANEL
ALT: 18 U/L (ref 14–54)
AST: 28 U/L (ref 15–41)
Albumin: 3 g/dL — ABNORMAL LOW (ref 3.5–5.0)
Alkaline Phosphatase: 49 U/L (ref 38–126)
Anion gap: 5 (ref 5–15)
BILIRUBIN TOTAL: 1.3 mg/dL — AB (ref 0.3–1.2)
CHLORIDE: 111 mmol/L (ref 101–111)
CO2: 22 mmol/L (ref 22–32)
CREATININE: 0.51 mg/dL (ref 0.44–1.00)
Calcium: 8.4 mg/dL — ABNORMAL LOW (ref 8.9–10.3)
GFR calc Af Amer: >60 mL/min (ref >60)
Glucose, Bld: 92 mg/dL (ref 65–99)
Potassium: 3.5 mmol/L (ref 3.5–5.1)
Sodium: 138 mmol/L (ref 135–145)
TOTAL PROTEIN: 5.5 g/dL — AB (ref 6.5–8.1)

## 2015-04-18 NOTE — Progress Notes (Signed)
Received report from ICU nurse.  Patient brought to unit with NT/sitter via wheelchair.  Placed on telemetry box #68 and CCMD notified.  Patient alert and oriented, no acute distress, boyfriend at bedside.  IV infusing D5+LR in Left hand.  Vitals obtained and patient assessed.  Will continue to monitor.

## 2015-04-18 NOTE — Consult Note (Signed)
[place] Psychiatry Consult   Reason for Consult:  Suicide attempt, depression and substance abuse Referring Physician:  Dr. Corrie Dandy Patient Identification: Kristen Schneider MRN:  [id number] Principal Diagnosis: <principal problem not specified> Diagnosis:   Patient Active Problem List   Diagnosis Date Noted  . OD (overdose of drug) [T50.901A] 04/16/2015  . Acute respiratory failure ([place]) [J96.00]   . Drug overdose [T50.901A] 06/23/2014  . Overdose [T50.901A] 06/23/2014  . Tobacco use disorder [F17.200] 05/30/2014  . Constipation [K59.00] 05/30/2014  . Acute appendicitis [K35.80] 05/23/2014  . Substance induced mood disorder ([place]) [F19.94] 01/31/2014  . Alcohol dependence with alcohol-induced mood disorder ([place]) [F10.24]     Total Time spent with patient: 45 minutes  Subjective:   Kristen Schneider is a 25 y.o. female patient admitted with intentional suicide attempt.  HPI:  Kristen Schneider is a 25 years old female seen for the face to face psychiatric evaluation for intentional overdose of psych medication as a suicide attempt. Patient stated that she has been suffering with depression over several years, or since her mother died in [facility] hospital. She has several poor coping skills in the past like self injurious behaviors, heroin and alcohol abuse vs dependence. She has been seen psychiatrist at [facility] and provided prescription medication over six months. She has overdosed all her left over medication with intent to end her life so that she can join her deceased mother. She has denied any recent stresses. She ingested Prozac 68 tabs,cogentin 23 tabs, Zyprexa 23 tabs. Her ETOH level was 67,+ for marijuana and opioids. She is currently extubated but required Intubation on arrival.   PAST MEDICAL HISTORY :  She  has a past medical history of Torn rotator cuff (left shoulder); Shingles (2006); Polysubstance abuse; Abnormal menstrual periods; and Anxiety.  Past  Psychiatric History: [facility] admission in 2014 for alcohol detox treatment.   Risk to Self: Is patient at risk for suicide?: Yes Risk to Others:   Prior Inpatient Therapy:   Prior Outpatient Therapy:    Past Medical History:  Past Medical History  Diagnosis Date  . Torn rotator cuff left shoulder  . Shingles 2006  . Polysubstance abuse   . Abnormal menstrual periods   . Anxiety     Past Surgical History  Procedure Laterality Date  . Tonsillectomy      25 y/o   . Wisdom tooth extraction      25 y/o   . Polypectomy      As a child multiple colonoscopy's for polyp removal  . Appendectomy    . Laparoscopic appendectomy N/A 05/23/2014    Procedure: APPENDECTOMY LAPAROSCOPIC;  Surgeon: Erroll Luna, MD;  Location: [place];  Service: General;  Laterality: N/A;  . Laparoscopic lysis of adhesions N/A 05/23/2014    Procedure: LAPAROSCOPIC LYSIS OF ADHESIONS;  Surgeon: Erroll Luna, MD;  Location: [place] OR;  Service: General;  Laterality: N/A;   Family History:  Family History  Problem Relation Age of Onset  . Alcoholism Father    Family Psychiatric  History: She has younger sister with methadone treatment.  Social History:  History  Alcohol Use No     History  Drug Use No    Comment: 05/23/14 - reports no use for last 4 months    Social History   Social History  . Marital Status: Single    Spouse Name: N/A  . Number of Children: N/A  . Years of Education: N/A   Social History Main Topics  .  Smoking status: Current Every Day Smoker -- 1.00 packs/day    Types: Cigarettes  . Smokeless tobacco: Former Systems developer  . Alcohol Use: No  . Drug Use: No     Comment: 05/23/14 - reports no use for last 4 months  . Sexual Activity: Yes    Birth Control/ Protection: Pill   Other Topics Concern  . None   Social History Narrative   Additional Social History: she lives with her BF x one year.     Allergies:   Allergies  Allergen Reactions  . Dilaudid [Hydromorphone Hcl] Rash    Rash  and itching    Labs:  Results for orders placed or performed during the hospital encounter of 04/16/15 (from the past 48 hour(s))  Urinalysis, Routine w reflex microscopic     Status: None   Collection Time: 04/16/15 11:41 AM  Result Value Ref Range   Color, Urine YELLOW YELLOW   APPearance CLEAR CLEAR   Specific Gravity, Urine 1.005 1.005 - 1.030   pH 7.0 5.0 - 8.0   Glucose, UA NEGATIVE NEGATIVE mg/dL   Hgb urine dipstick NEGATIVE NEGATIVE   Bilirubin Urine NEGATIVE NEGATIVE   Ketones, ur NEGATIVE NEGATIVE mg/dL   Protein, ur NEGATIVE NEGATIVE mg/dL   Nitrite NEGATIVE NEGATIVE   Leukocytes, UA NEGATIVE NEGATIVE    Comment: MICROSCOPIC NOT DONE ON URINES WITH NEGATIVE PROTEIN, BLOOD, LEUKOCYTES, NITRITE, OR GLUCOSE <1000 mg/dL.  Urine rapid drug screen (hosp performed)     Status: Abnormal   Collection Time: 04/16/15 11:41 AM  Result Value Ref Range   Opiates POSITIVE (A) NONE DETECTED   Cocaine NONE DETECTED NONE DETECTED   Benzodiazepines NONE DETECTED NONE DETECTED   Amphetamines NONE DETECTED NONE DETECTED   Tetrahydrocannabinol POSITIVE (A) NONE DETECTED   Barbiturates NONE DETECTED NONE DETECTED    Comment:        DRUG SCREEN FOR MEDICAL PURPOSES ONLY.  IF CONFIRMATION IS NEEDED FOR ANY PURPOSE, NOTIFY LAB WITHIN 5 DAYS.        LOWEST DETECTABLE LIMITS FOR URINE DRUG SCREEN Drug Class       Cutoff (ng/mL) Amphetamine      1000 Barbiturate      200 Benzodiazepine   841 Tricyclics       660 Opiates          300 Cocaine          300 THC              50   Strep pneumoniae urinary antigen     Status: None   Collection Time: 04/16/15 11:41 AM  Result Value Ref Range   Strep Pneumo Urinary Antigen NEGATIVE NEGATIVE    Comment:        Infection due to S. pneumoniae cannot be absolutely ruled out since the antigen present may be below the detection limit of the test. Performed at [facility]   MRSA PCR Screening     Status: None   Collection Time:  04/16/15  2:53 PM  Result Value Ref Range   MRSA by PCR NEGATIVE NEGATIVE    Comment:        The GeneXpert MRSA Assay (FDA approved for NASAL specimens only), is one component of a comprehensive MRSA colonization surveillance program. It is not intended to diagnose MRSA infection nor to guide or monitor treatment for MRSA infections.   Renal function panel     Status: Abnormal   Collection Time: 04/16/15  8:32 PM  Result Value  Ref Range   Sodium 143 135 - 145 mmol/L   Potassium 2.9 (L) 3.5 - 5.1 mmol/L   Chloride 112 (H) 101 - 111 mmol/L   CO2 20 (L) 22 - 32 mmol/L   Glucose, Bld 110 (H) 65 - 99 mg/dL   BUN 5 (L) 6 - 20 mg/dL   Creatinine, Ser 0.62 0.44 - 1.00 mg/dL   Calcium 8.0 (L) 8.9 - 10.3 mg/dL   Phosphorus 2.5 2.5 - 4.6 mg/dL   Albumin 3.7 3.5 - 5.0 g/dL   GFR calc non Af Amer >60 >60 mL/min   GFR calc Af Amer >60 >60 mL/min    Comment: (NOTE) The eGFR has been calculated using the CKD EPI equation. This calculation has not been validated in all clinical situations. eGFR's persistently <60 mL/min signify possible Chronic Kidney Disease.    Anion gap 11 5 - 15  Magnesium     Status: Abnormal   Collection Time: 04/16/15  8:32 PM  Result Value Ref Range   Magnesium 1.5 (L) 1.7 - 2.4 mg/dL  Hepatic function panel     Status: Abnormal   Collection Time: 04/16/15  8:32 PM  Result Value Ref Range   Total Protein 6.1 (L) 6.5 - 8.1 g/dL   Albumin 3.5 3.5 - 5.0 g/dL   AST 28 15 - 41 U/L   ALT 17 14 - 54 U/L   Alkaline Phosphatase 54 38 - 126 U/L   Total Bilirubin 1.0 0.3 - 1.2 mg/dL   Bilirubin, Direct 0.1 0.1 - 0.5 mg/dL   Indirect Bilirubin 0.9 0.3 - 0.9 mg/dL  CBC     Status: Abnormal   Collection Time: 04/17/15  3:12 AM  Result Value Ref Range   WBC 13.4 (H) 4.0 - 10.5 K/uL   RBC 3.98 3.87 - 5.11 MIL/uL   Hemoglobin 11.7 (L) 12.0 - 15.0 g/dL   HCT 34.7 (L) 36.0 - 46.0 %   MCV 87.2 78.0 - 100.0 fL   MCH 29.4 26.0 - 34.0 pg   MCHC 33.7 30.0 - 36.0 g/dL    RDW 17.3 (H) 11.5 - 15.5 %   Platelets 269 150 - 400 K/uL  Renal function panel     Status: Abnormal   Collection Time: 04/17/15  3:12 AM  Result Value Ref Range   Sodium 136 135 - 145 mmol/L    Comment: DELTA CHECK NOTED REPEATED TO VERIFY    Potassium 4.3 3.5 - 5.1 mmol/L    Comment: DELTA CHECK NOTED REPEATED TO VERIFY    Chloride 112 (H) 101 - 111 mmol/L   CO2 15 (L) 22 - 32 mmol/L   Glucose, Bld 106 (H) 65 - 99 mg/dL   BUN <5 (L) 6 - 20 mg/dL   Creatinine, Ser 0.53 0.44 - 1.00 mg/dL   Calcium 7.8 (L) 8.9 - 10.3 mg/dL   Phosphorus 2.3 (L) 2.5 - 4.6 mg/dL   Albumin 3.3 (L) 3.5 - 5.0 g/dL   GFR calc non Af Amer >60 >60 mL/min   GFR calc Af Amer >60 >60 mL/min    Comment: (NOTE) The eGFR has been calculated using the CKD EPI equation. This calculation has not been validated in all clinical situations. eGFR's persistently <60 mL/min signify possible Chronic Kidney Disease.    Anion gap 9 5 - 15  Magnesium     Status: Abnormal   Collection Time: 04/17/15  3:12 AM  Result Value Ref Range   Magnesium 2.6 (H) 1.7 - 2.4 mg/dL  Blood gas, arterial     Status: Abnormal   Collection Time: 04/17/15  3:30 AM  Result Value Ref Range   FIO2 0.30    Delivery systems VENTILATOR    Mode PRESSURE REGULATED VOLUME CONTROL    VT 500 mL   LHR 24 resp/min   Peep/cpap 5.0 cm H20   pH, Arterial 7.515 (H) 7.350 - 7.450   pCO2 arterial 19.7 (LL) 35.0 - 45.0 mmHg    Comment: CRITICAL RESULT CALLED TO, READ BACK BY AND VERIFIED WITH:  MELISSA SCHRAMM, RN AT 0340 BY Jamisyn NEUGENT,RRT,RCP ON 04/17/15    pO2, Arterial 121 (H) 80.0 - 100.0 mmHg   Bicarbonate 15.6 (L) 20.0 - 24.0 mEq/L   TCO2 13.8 0 - 100 mmol/L   Acid-base deficit 5.1 (H) 0.0 - 2.0 mmol/L   O2 Saturation 98.0 %   Patient temperature 37.9    Collection site RIGHT BRACHIAL    Drawn by [id number]    Sample type ARTERIAL DRAW   Blood gas, arterial     Status: Abnormal   Collection Time: 04/17/15 10:50 AM  Result Value Ref Range    FIO2 0.30    Delivery systems VENTILATOR    Mode PRESSURE REGULATED VOLUME CONTROL    VT 500 mL   LHR 16 resp/min   Peep/cpap 5.0 cm H20   pH, Arterial 7.474 (H) 7.350 - 7.450   pCO2 arterial 22.3 (L) 35.0 - 45.0 mmHg   pO2, Arterial 169 (H) 80.0 - 100.0 mmHg   Bicarbonate 16.2 (L) 20.0 - 24.0 mEq/L   TCO2 14.4 0 - 100 mmol/L   Acid-base deficit 5.5 (H) 0.0 - 2.0 mmol/L   O2 Saturation 98.8 %   Patient temperature 37.0    Collection site BRACHIAL ARTERY    Drawn by [id number]    Sample type ARTERIAL DRAW    Allens test (pass/fail) PASS PASS  CBC     Status: Abnormal   Collection Time: 04/18/15  3:07 AM  Result Value Ref Range   WBC 8.2 4.0 - 10.5 K/uL   RBC 3.77 (L) 3.87 - 5.11 MIL/uL   Hemoglobin 11.4 (L) 12.0 - 15.0 g/dL   HCT 34.5 (L) 36.0 - 46.0 %   MCV 91.5 78.0 - 100.0 fL   MCH 30.2 26.0 - 34.0 pg   MCHC 33.0 30.0 - 36.0 g/dL   RDW 17.7 (H) 11.5 - 15.5 %   Platelets 243 150 - 400 K/uL  Comprehensive metabolic panel     Status: Abnormal   Collection Time: 04/18/15  3:07 AM  Result Value Ref Range   Sodium 138 135 - 145 mmol/L   Potassium 3.5 3.5 - 5.1 mmol/L    Comment: DELTA CHECK NOTED REPEATED TO VERIFY    Chloride 111 101 - 111 mmol/L   CO2 22 22 - 32 mmol/L   Glucose, Bld 92 65 - 99 mg/dL   BUN <5 (L) 6 - 20 mg/dL   Creatinine, Ser 0.51 0.44 - 1.00 mg/dL   Calcium 8.4 (L) 8.9 - 10.3 mg/dL   Total Protein 5.5 (L) 6.5 - 8.1 g/dL   Albumin 3.0 (L) 3.5 - 5.0 g/dL   AST 28 15 - 41 U/L   ALT 18 14 - 54 U/L   Alkaline Phosphatase 49 38 - 126 U/L   Total Bilirubin 1.3 (H) 0.3 - 1.2 mg/dL   GFR calc non Af Amer >60 >60 mL/min   GFR calc Af Amer >60 >60 mL/min    Comment: (  NOTE) The eGFR has been calculated using the CKD EPI equation. This calculation has not been validated in all clinical situations. eGFR's persistently <60 mL/min signify possible Chronic Kidney Disease.    Anion gap 5 5 - 15    Current Facility-Administered Medications  Medication Dose  Route Frequency Provider Last Rate Last Dose  . dextrose 5 % in lactated ringers infusion   Intravenous Continuous Erick Colace, NP 100 mL/hr at 04/18/15 1000    . heparin injection 5,000 Units  5,000 Units Subcutaneous 3 times per day Grace Bushy Minor, NP   5,000 Units at 04/18/15 0658    Musculoskeletal: Strength & Muscle Tone: within normal limits Gait & Station: normal Patient leans: N/A  Psychiatric Specialty Exam: ROS  No Fever-chills, No Headache, No changes with Vision or hearing, reports vertigo No problems swallowing food or Liquids, No Chest pain, Cough or Shortness of Breath, No Abdominal pain, No Nausea or Vommitting, Bowel movements are regular, No Blood in stool or Urine, No dysuria, No new skin rashes or bruises, No new joints pains-aches,  No new weakness, tingling, numbness in any extremity, No recent weight gain or loss, No polyuria, polydypsia or polyphagia,   A full 10 point Review of Systems was done, except as stated above, all other Review of Systems were negative.  Blood pressure 114/74, pulse 89, temperature 98.4 F (36.9 C), temperature source Oral, resp. rate 20, height '5\' 4"'$  (1.626 m), weight 70.3 kg (154 lb 15.7 oz), SpO2 97 %.Body mass index is 26.59 kg/(m^2).  General Appearance: Casual  Eye Contact::  Good  Speech:  Clear and Coherent  Volume:  Normal  Mood:  Depressed  Affect:  Appropriate and Congruent  Thought Process:  Coherent and Goal Directed  Orientation:  Full (Time, Place, and Person)  Thought Content:  Paranoid Ideation and Rumination  Suicidal Thoughts:  Yes.  with intent/plan  Homicidal Thoughts:  No  Memory:  Immediate;   Good Recent;   Good Remote;   Fair  Judgement:  Impaired  Insight:  Present  Psychomotor Activity:  Normal  Concentration:  Good  Recall:  Good  Fund of Knowledge:Good  Language: Good  Akathisia:  Negative  Handed:  Right  AIMS (if indicated):     Assets:  Communication Skills Desire for  Improvement Financial Resources/Insurance Housing Intimacy Leisure Time Physical Health Resilience Social Support Talents/Skills Transportation  ADL's:  Intact  Cognition: WNL  Sleep:      Treatment Plan Summary: Daily contact with patient to assess and evaluate symptoms and progress in treatment and Medication management   Chief Technology Officer - can not contract for safety No psych medication for now Refer to LCSW for psych placement  Appreciate psychiatric consultation and we sign off as of today Please contact [phone] or [phone] if needs further assistance   Disposition: Recommend psychiatric Inpatient admission when medically cleared. Supportive therapy provided about ongoing stressors.  Durward Parcel., MD 04/18/2015 11:37 AM

## 2015-04-18 NOTE — Progress Notes (Signed)
Nutrition Follow-up  DOCUMENTATION CODES:   Not applicable  INTERVENTION:  -RD continue to monitor for needs  NUTRITION DIAGNOSIS:   Inadequate oral intake related to inability to eat as evidenced by NPO status. No longer accurate, pt is eating  GOAL:   Patient will meet greater than or equal to 90% of their needs  Not meeting with CLD -> On solid food now  MONITOR:   Vent status, Labs, Weight trends, I & O's  REASON FOR ASSESSMENT:   Ventilator    ASSESSMENT:   [name] with bipolar disorder, drinks and does heroin, who ingested Prozac 68 tabs,cogentin 23 tabs, Zyprexa 23 tabs, ETOH level was 67,+ for marijuana benzo and told EMS she was attempting suicide. Intubated for airway protection  Spoke with Ms. Ermalinda MemosBradshaw briefly.  She had arby's in front of her during visit, brought by boyfriend.   Stated she has had no issues tolerating CLD or Solid Food.  No Nausea/vomiting. Good Appetite -> consumed all of CLD. Has not received a tray of solid food yet.  Per CCM note she will tx to tele. Still monitor prolonged QTC -> Appears to be resolved. Will undergo a psych eval as well -> Intentional overdose.  Diet Order:  Diet regular Room service appropriate?: Yes; Fluid consistency:: Thin  Skin:  Reviewed, no issues  Last BM:  PTA  Height:   Ht Readings from Last 1 Encounters:  04/16/15 5\' 4"  (1.626 m)    Weight:   Wt Readings from Last 1 Encounters:  04/18/15 154 lb 15.7 oz (70.3 kg)    Ideal Body Weight:  54.5 kg  BMI:  Body mass index is 26.59 kg/(m^2).  Estimated Nutritional Needs:   Kcal:  1400-1750 calories  Protein:  70-85 grams  Fluid:  2L/day  EDUCATION NEEDS:   No education needs identified at this time  Dionne AnoWilliam M. Sera Hitsman, MS, RD LDN After Hours/Weekend Pager [phone]

## 2015-04-18 NOTE — Progress Notes (Signed)
   04/18/15 1400  Clinical Encounter Type  Visited With Patient and family together  Visit Type Follow-up;Psychological support;Spiritual support  Referral From Patient  Consult/Referral To Chaplain  Spiritual Encounters  Spiritual Needs Emotional;Other (Comment) (Pastoral Support/Conversation )  Stress Factors  Patient Stress Factors None identified  Family Stress Factors None identified   I followed up with the patient from previous visits in the ED and ICU. The patient was sitting up in bed, smiling and talking with her 2 friends who had come to visit her. The patient stated that she was doing much better than she had been. She was visibly happier and enjoying the time with her visitors. Her sitter was present at the bedside. The Chaplain will continue to follow-up with this patient while she is here.    Chaplain Clint BolderBrittany Amilya Haver M.Div.

## 2015-04-18 NOTE — Progress Notes (Signed)
PULMONARY / CRITICAL CARE MEDICINE   Name: Kristen Schneider MRN: [id number] DOB: 01-05-1991    ADMISSION DATE:  04/16/2015   REFERRING MD:  EDP  CHIEF COMPLAINT: OD  HISTORY OF PRESENT ILLNESS:   [name] with bipolar disorder, drinks and does heroin, who ingested  Prozac 68 tabs,cogentin 23 tabs, Zyprexa 23 tabs,  ETOH level was 67,+ for marijuana benzo and told EMS she was attempting suicide. Intubated for airway protection and PCCM called to admit.   SUBJECTIVE:  Awake, no distress.   VITAL SIGNS: BP 99/65 mmHg  Pulse 93  Temp(Src) 98.4 F (36.9 C) (Core (Comment))  Resp 15  Ht 5\' 4"  (1.626 m)  Wt 154 lb 15.7 oz (70.3 kg)  BMI 26.59 kg/m2  SpO2 99%  HEMODYNAMICS:    VENTILATOR SETTINGS: Vent Mode:  [-] PRVC FiO2 (%):  [30 %] 30 % Set Rate:  [16 bmp] 16 bmp Vt Set:  [500 mL] 500 mL PEEP:  [5 cmH20] 5 cmH20 Plateau Pressure:  [15 cmH20-21 cmH20] 21 cmH20  INTAKE / OUTPUT: I/O last 3 completed shifts: In: 5337.9 [P.O.:300; I.V.:3674.6; Other:450; NG/GT:60; IV Piggyback:853.3] Out: 6162 [Urine:6062; Emesis/NG output:100]  PHYSICAL EXAMINATION: General:  Now awake and alert, no focal def.  Neuro:  awake follows commands, MAE x 4, appropriate but sleepy HEENT:  PERL, No JVD/LAN Cardiovascular:  HSR RRR Lungs: CTA, no accessory use  Abdomen: Soft + bs Musculoskeletal:  intact Skin: warm, no edema  LABS:  BMET  Recent Labs Lab 04/16/15 2032 04/17/15 0312 04/18/15 0307  NA 143 136 138  K 2.9* 4.3 3.5  CL 112* 112* 111  CO2 20* 15* 22  BUN 5* <5* <5*  CREATININE 0.62 0.53 0.51  GLUCOSE 110* 106* 92    Electrolytes  Recent Labs Lab 04/16/15 1046 04/16/15 1119 04/16/15 2032 04/17/15 0312 04/18/15 0307  CALCIUM 7.8*  --  8.0* 7.8* 8.4*  MG 1.8  --  1.5* 2.6*  --   PHOS  --  2.1* 2.5 2.3*  --     CBC  Recent Labs Lab 04/16/15 1122 04/17/15 0312 04/18/15 0307  WBC 11.6* 13.4* 8.2  HGB 11.8* 11.7* 11.4*  HCT 36.8 34.7* 34.5*  PLT  258 269 243    Coag's  Recent Labs Lab 04/16/15 1119  APTT 26  INR 1.18    Sepsis Markers  Recent Labs Lab 04/16/15 1119  LATICACIDVEN 1.9    ABG  Recent Labs Lab 04/16/15 1122 04/17/15 0330 04/17/15 1050  PHART 7.357 7.515* 7.474*  PCO2ART 38.1 19.7* 22.3*  PO2ART 156* 121* 169*    Liver Enzymes  Recent Labs Lab 04/16/15 1046 04/16/15 2032 04/17/15 0312 04/18/15 0307  AST 19 28  --  28  ALT 14 17  --  18  ALKPHOS 52 54  --  49  BILITOT 0.4 1.0  --  1.3*  ALBUMIN 3.6 3.5  3.7 3.3* 3.0*    Cardiac Enzymes No results for input(s): TROPONINI, PROBNP in the last 168 hours.  Glucose  Recent Labs Lab 04/16/15 0945  GLUCAP 111*    Imaging No results found.   STUDIES:    CULTURES: None  ANTIBIOTICS: none  SIGNIFICANT EVENTS: 3/21 suicide attempt  LINES/TUBES: 3/22 ET in OR   ASSESSMENT / PLAN:   OD with polysubstance's superimposed on bipolar dz and possible seizure d/o. This was an intentional ingestion.   Prolonged Qtc -->resolved Plan:  Cont IVFs another 24 hrs Transfer to tele Check QTc every shift.  If Qtc remains < 0.5 over nexxt 24 hrs tele can be d/c'd Therapist, sports Will ask psych to see.     ICU course:  25 yo bipolar female admitted on 3/22 s/p polysubtance OD. Ingestion included: Prozac 68 tabs,cogentin 23 tabs, Zyprexa 23 tabs,  ETOH level was 67,+ for marijuana benzo and told EMS she was attempting suicide.She was intubated for airway protection. [place] poison control was contacted, and she was provided w/ supplemental IV hydration and continuous telemetry monitoring. She did indeed have: prolonged QTc and Non-anion gap metabolic acidosis. These both improved w/ supportive care and IV hydration. She was fully awake and successfully extubated on 3/23. At that point she still had mild non-anion gap metabolic acidosis and prolonged QTc. As of 3/24 4er QTc is now < 0.5 and her acidosis has resolved. She is now cleared to  go to Tele. We will ask psych to assess. Suspect she will need in-patient assistance w/ her medication regimen as well as stress & coping management skills.    Simonne Martinet ACNP-BC [facility] Pulmonary/Critical Care Pager # [phone] OR # [phone] if no answer   04/18/2015, 9:27 AM

## 2015-04-18 NOTE — Progress Notes (Signed)
Critical care interim  Discharge Summary       Patient ID: Kristen Schneider Kristen Schneider Schneider MRN: [id number] DOB/AGE: 24/12/1990 24 y.o.  Admit date: 04/16/2015 Discharge date: 04/18/2015  Discharge Diagnoses:   Acute respiratory failure  polysubstance OD Prolonged Qtc  Mild NAG metabolic acidosis  Possible seizure disorder  Bipolar disease  Suicide attempt  ETOH abuse Heroin abuse   Detailed Hospital Course:  [name] with bipolar disorder, drinks and does heroin, who ingested Prozac 68 tabs,cogentin 23 tabs, Zyprexa 23 tabs, ETOH level was 67,+ for marijuana benzo and told EMS she was attempting suicide. Intubated for airway protection and PCCM called to admit. She was intubated for airway protection. [place] poison control was contacted, and she was provided w/ supplemental IV hydration and continuous telemetry monitoring. She did indeed have: prolonged QTc and Non-anion gap metabolic acidosis. These both improved w/ supportive care and IV hydration. She was fully awake and successfully extubated on 3/23. At that point she still had mild non-anion gap metabolic acidosis and prolonged QTc. As of 3/24 4er QTc is now < 0.5 and her acidosis has resolved. She is now cleared to go to Tele. We will ask psych to assess. Suspect she will need in-patient assistance w/ her medication regimen as well as stress & coping management skills.     Discharge Plan by active problems   OD with polysubstance's superimposed on bipolar dz and possible seizure d/o. This was an intentional ingestion.  Prolonged Qtc -->resolved Plan:  Cont IVFs another 24 hrs Transfer to tele Check QTc every shift.  If Qtc remains < 0.5 over nexxt 24 hrs tele can be d/c'd Therapist, sportsCont safety sitter Will ask psych to see.    Significant Hospital tests/ studies  Consults: psychiatry   Discharge Exam: BP 114/74 mmHg  Pulse 89  Temp(Src) 98.4 F (36.9 C) (Oral)  Resp 20  Ht 5\' 4"  (1.626 m)  Wt 154 lb 15.7 oz (70.3 kg)  BMI 26.59  kg/m2  SpO2 97%  General: Now awake and alert, no focal def.  Neuro: awake follows commands, MAE x 4, appropriate but sleepy HEENT: PERL, No JVD/LAN Cardiovascular: HSR RRR Lungs: CTA, no accessory use  Abdomen: Soft + bs Musculoskeletal: intact Skin: warm, no edema  Labs at discharge Lab Results  Component Value Date   CREATININE 0.51 04/18/2015   BUN <5* 04/18/2015   NA 138 04/18/2015   K 3.5 04/18/2015   CL 111 04/18/2015   CO2 22 04/18/2015   Lab Results  Component Value Date   WBC 8.2 04/18/2015   HGB 11.4* 04/18/2015   HCT 34.5* 04/18/2015   MCV 91.5 04/18/2015   PLT 243 04/18/2015   Lab Results  Component Value Date   ALT 18 04/18/2015   AST 28 04/18/2015   ALKPHOS 49 04/18/2015   BILITOT 1.3* 04/18/2015   Lab Results  Component Value Date   INR 1.18 04/16/2015    Current radiology studies No results found.  Disposition: Pending psych eval   Current meds:   Current facility-administered medications:  .  dextrose 5 % in lactated ringers infusion, , Intravenous, Continuous, Simonne MartinetPeter E Sutter Ahlgren, NP, Last Rate: 100 mL/hr at 04/18/15 1000 .  heparin injection 5,000 Units, 5,000 Units, Subcutaneous, 3 times per day, Vilinda BlanksWilliam S Minor, NP, 5,000 Units at 04/18/15 [id number]   Discharged Condition: good  Signed: Shelby MattocksPete E Dreanna Kyllo 04/18/2015, 1:34 PM

## 2015-04-19 DIAGNOSIS — T1491 Suicide attempt: Secondary | ICD-10-CM | POA: Diagnosis not present

## 2015-04-19 DIAGNOSIS — T43592A Poisoning by other antipsychotics and neuroleptics, intentional self-harm, initial encounter: Secondary | ICD-10-CM | POA: Diagnosis not present

## 2015-04-19 DIAGNOSIS — R45851 Suicidal ideations: Secondary | ICD-10-CM | POA: Diagnosis not present

## 2015-04-19 DIAGNOSIS — T443X2A Poisoning by other parasympatholytics [anticholinergics and antimuscarinics] and spasmolytics, intentional self-harm, initial encounter: Secondary | ICD-10-CM | POA: Diagnosis not present

## 2015-04-19 LAB — GLUCOSE, CAPILLARY: Glucose-Capillary: 91 mg/dL (ref 65–99)

## 2015-04-19 MED ORDER — PHENOL 1.4 % MT LIQD
1.0000 | OROMUCOSAL | Status: DC | PRN
  Filled 2015-04-19: qty 177

## 2015-04-19 NOTE — Progress Notes (Signed)
PROGRESS NOTE  Kristen Schneider YQM:[id number] DOB: 1990/05/21 DOA: 04/16/2015 PCP: No PCP Per Patient  [name] with bipolar disorder, drinks and does heroin, who ingested Prozac 68 tabs,cogentin 23 tabs, Zyprexa 23 tabs, ETOH level was 67,+ for marijuana benzo and told EMS she was attempting suicide. Intubated for airway protection and PCCM called to admit. She was intubated for airway protection.  poison control was contacted, and she was provided w/ supplemental IV hydration and continuous telemetry monitoring. She did indeed have: prolonged QTc and Non-anion gap metabolic acidosis. These both improved w/ supportive care and IV hydration. She was fully awake and successfully extubated on 3/23.    Assessment/Plan: OD with polysubstance's superimposed on bipolar dz and possible seizure d/o. This was an intentional ingestion.  Prolonged Qtc -->resolved Plan:  Medically cleared to go to inpatient psych when bed available  Code Status: full Family Communication: patient Disposition Plan:    Consultants:  PCCM  psych  Procedures:     HPI/Subjective: Awke, c/o sore throat  Objective: Filed Vitals:   04/18/15 2135 04/19/15 0544  BP: 121/70 117/73  Pulse: 74 73  Temp: 98.7 F (37.1 C) 98.4 F (36.9 C)  Resp: 18 18    Intake/Output Summary (Last 24 hours) at 04/19/15 1310 Last data filed at 04/18/15 1840  Gross per 24 hour  Intake    240 ml  Output      2 ml  Net    238 ml   Filed Weights   04/16/15 1500 04/17/15 0400 04/18/15 0608  Weight: 70.4 kg (155 lb 3.3 oz) 72.7 kg (160 lb 4.4 oz) 70.3 kg (154 lb 15.7 oz)    Exam:   General:  Awake, NAD  Musculoskeletal: no edema   Data Reviewed: Basic Metabolic Panel:  Recent Labs Lab 04/16/15 1046 04/16/15 1119 04/16/15 2032 04/17/15 0312 04/18/15 0307  NA 138  --  143 136 138  K 3.1*  --  2.9* 4.3 3.5  CL 106  --  112* 112* 111  CO2 22  --  20* 15* 22  GLUCOSE 99  --  110* 106* 92  BUN 9  --   5* <5* <5*  CREATININE 0.61  --  0.62 0.53 0.51  CALCIUM 7.8*  --  8.0* 7.8* 8.4*  MG 1.8  --  1.5* 2.6*  --   PHOS  --  2.1* 2.5 2.3*  --    Liver Function Tests:  Recent Labs Lab 04/16/15 1046 04/16/15 2032 04/17/15 0312 04/18/15 0307  AST 19 28  --  28  ALT 14 17  --  18  ALKPHOS 52 54  --  49  BILITOT 0.4 1.0  --  1.3*  PROT 6.1* 6.1*  --  5.5*  ALBUMIN 3.6 3.5  3.7 3.3* 3.0*    Recent Labs Lab 04/16/15 1119  LIPASE 28  AMYLASE 37   No results for input(s): AMMONIA in the last 168 hours. CBC:  Recent Labs Lab 04/16/15 1122 04/17/15 0312 04/18/15 0307  WBC 11.6* 13.4* 8.2  NEUTROABS 10.1*  --   --   HGB 11.8* 11.7* 11.4*  HCT 36.8 34.7* 34.5*  MCV 92.9 87.2 91.5  PLT 258 269 243   Cardiac Enzymes: No results for input(s): CKTOTAL, CKMB, CKMBINDEX, TROPONINI in the last 168 hours. BNP (last 3 results) No results for input(s): BNP in the last 8760 hours.  ProBNP (last 3 results) No results for input(s): PROBNP in the last 8760 hours.  CBG:  Recent Labs Lab 04/16/15 0945 04/18/15 0832  GLUCAP 111* 91    Recent Results (from the past 240 hour(s))  MRSA PCR Screening     Status: None   Collection Time: 04/16/15  2:53 PM  Result Value Ref Range Status   MRSA by PCR NEGATIVE NEGATIVE Final    Comment:        The GeneXpert MRSA Assay (FDA approved for NASAL specimens only), is one component of a comprehensive MRSA colonization surveillance program. It is not intended to diagnose MRSA infection nor to guide or monitor treatment for MRSA infections.      Studies: No results found.  Scheduled Meds: . heparin  5,000 Units Subcutaneous 3 times per day   Continuous Infusions:  Antibiotics Given (last 72 hours)    None      Principal Problem:   OD (overdose of drug) Active Problems:   Alcohol dependence with alcohol-induced mood disorder (HCC)   Cannabis abuse without complication    Time spent: 15 min    Aidah U  [facility]  [facility] Pager [phone]. If 7PM-7AM, please contact night-coverage at [email or web address], password [facility] 04/19/2015, 1:10 PM  LOS: 3 days

## 2015-04-19 NOTE — Clinical Social Work Psych Assess (Signed)
Clinical Social Work Programme researcher, broadcasting/film/video Social Worker:  Duke Salvia, LCSW Date/Time:  04/19/2015, 4:15 PM Referred By:  Physician Date Referred:  04/19/15 Reason for Referral:  Behavioral Health Issues   Presenting Symptoms/Problems  Presenting Symptoms/Problems(in person's/family's own words): Patient shared she overdosed on all of her remaining bipolar and anxiety medications. Patient shares she doesn't remember exactly how many, but know it was over 20. Patient shared she was triggered by missing her mom. She also was drinking the night before with her sister's boyfriend at her sisters house. She verablizes that drinking does not every help her situation, and she shouldn't be drinking with her medications. Patient denies any trauma or domestic violence.   She shares she goes to [facility] for medication management but does not see a Veterinary surgeon. Patient shares she would like to have a counselor in order to continue working on coping skills with anxiety, as well as when she is feeling down.   She shared she has been two years clean from heroine and has been 1 month clean of crack cocaine. Patient shares she still drinks alcohol whenever she can, but wants to stop.   CSW encouraged her to consider a substnace abuse program once completing treatment at an inpatient hosptial.  Patient verbalized that she would consider another program.  Patient gave permission for her step mother and dad to be present. Pt father stepped out of the room during assessment, but inquired further about long term treatment.    Abuse/Neglect/Trauma History  Abuse/Neglect/Trauma History:  Denies History Abuse/Neglect/Trauma History Comments (indicate dates):     Psychiatric History  Psychiatric History:  Inpatient/Hospitalization Psychiatric Medication:     Current Mental Health Hospitalizations/Previous Mental Health History:  Previous admissions at [facility] and [facility] for bipolar and  anxiety. Previous Si attempts in the past.    Current Provider: Vesta Mixer   Place and Date:  On going    Current Medications:  prozac    Previous Inpatient Admission/Date/Reason:  2013, 2015   Emotional Health/Current Symptoms  Suicide/Self Harm: Suicide Attempt in the Past (date/description), Suicidal Ideation (ex. "I can't take anymore, I wish I could disappear") Suicide Attempt in Past (date/description):   Other Harmful Behavior (ex. homicidal ideation) (describe):     Psychotic/Dissociative Symptoms  Psychotic/Dissociative Symptoms: None Reported Other Psychotic/Dissociative Symptoms:     Attention/Behavioral Symptoms  Attention/Behavioral Symptoms: Within Normal Limits Other Attention/Behavioral Symptoms:     Cognitive Impairment  Cognitive Impairment:  Within Normal Limits Other Cognitive Impairment:     Mood and Adjustment  Mood and Adjustment:  Depression   Stress, Anxiety, Trauma, Any Recent Loss/Stressor  Stress, Anxiety, Trauma, Any Recent Loss/Stressor: Anxiety, Grief/Loss (recent or history) (mother passed in 2012 unexpected) Anxiety (frequency):  Daily   Phobia (specify):    Compulsive Behavior (specify):    Obsessive Behavior (specify):    Other Stress, Anxiety, Trauma, Any Recent Loss/Stressor:  Grief of losing her mother, not staying on medications as prescribed, tempted by others who are drinking.   Substance Abuse/Use  Substance Abuse/Use: History of Substance Use, In Recovery (crack cocaine sober 1 month, heroine 2 years, alcohol ongoing) SBIRT Completed (please refer for detailed history): No Self-reported Substance Use (last use and frequency): alcohol whenever she can. She did not mention thc, however tested positive  Urinary Drug Screen Completed: Yes Alcohol Level:  Opiates and thc    Environment/Housing/Living Arrangement  Environmental/Housing/Living Arrangement: Stable Housing (boyfriend and mother) Who is in the Home:   Boyfriend and  boyfriends mother  Emergency Contact:  See chart  Financial  Financial:  (family medicaid)   Patient's Strengths and Goals  Patient's Strengths and Goals (patient's own words):  Clean from heroine 2 years, clean from crack a month ago.Marland Kitchen. Open and honest. Patient seems to have regret of her actions, and wants to take charge. However she lacks confidence in  Herself of coping with loss of her mother and ongoing bipolar and anxiety.    Clinical Social Worker's Interpretive Summary  Clinical Social Workers Interpretive Summary: Patient meets inpatient psychiatric treatment.    Disposition  Disposition: Inpatient Referral Made [facility] ([facility] )

## 2015-04-19 NOTE — Progress Notes (Signed)
Pt referred to:  [facility]-under review  [facility]- at capacity- check daily [facility]- at capacity-check daily.   Olga CoasterKristen Lillian Ballester, LCSW  Clinical Social Work   [phone]

## 2015-04-19 NOTE — Consult Note (Signed)
[facility] Face-to-Face Psychiatry Consult Follow Up  Reason for Consult:  Suicide attempt, depression and substance abuse Referring Physician:  Dr. Corrie Dandy Patient Identification: Kristen Schneider MRN:  [id number] Principal Diagnosis: OD (overdose of drug) Diagnosis:   Patient Active Problem List   Diagnosis Date Noted  . Cannabis abuse without complication [S82.70] 78/67/5449  . OD (overdose of drug) [T50.901A] 04/16/2015  . Acute respiratory failure ([place]) [J96.00]   . Drug overdose [T50.901A] 06/23/2014  . Overdose [T50.901A] 06/23/2014  . Tobacco use disorder [F17.200] 05/30/2014  . Constipation [K59.00] 05/30/2014  . Acute appendicitis [K35.80] 05/23/2014  . Substance induced mood disorder ([place]) [F19.94] 01/31/2014  . Alcohol dependence with alcohol-induced mood disorder ([place]) [F10.24]     Total Time spent with patient: 20 minutes  Subjective:   Kristen Schneider is a 25 y.o. female patient admitted with intentional suicide attempt.  HPI:  Kristen Schneider is a 25 years old female seen for the face to face psychiatric evaluation for intentional overdose of psych medication as a suicide attempt. Patient stated that she has been suffering with depression over several years, or since her mother died in [facility] hospital. She has several poor coping skills in the past like self injurious behaviors, heroin and alcohol abuse vs dependence. She has been seen psychiatrist at [facility] and provided prescription medication over six months. She has overdosed all her left over medication with intent to end her life so that she can join her deceased mother. She has denied any recent stresses. She ingested Prozac 68 tabs,cogentin 23 tabs, Zyprexa 23 tabs. Her ETOH level was 67,+ for marijuana and opioids. She is currently extubated but required Intubation on arrival. Past Psychiatric History: [facility] admission in 2014 for alcohol detox treatment.   PAST MEDICAL HISTORY :  She  has a past medical history  of Torn rotator cuff (left shoulder); Shingles (2006); Polysubstance abuse; Abnormal menstrual periods; and Anxiety.  Interval history: Patient seen for psych consultation follow up. Patient is calm and cooperative. She has stated that multiple physical problems but chronic in nature. She has Kristen Schneider. She continue to report depression, anxiety and currently denied suicide ideation. She continue to meet criteria for acute psych admission.     Risk to Self: Is patient at risk for suicide?: Yes Risk to Others:   Prior Inpatient Therapy:   Prior Outpatient Therapy:    Past Medical History:  Past Medical History  Diagnosis Date  . Torn rotator cuff left shoulder  . Shingles 2006  . Polysubstance abuse   . Abnormal menstrual periods   . Anxiety     Past Surgical History  Procedure Laterality Date  . Tonsillectomy      25 y/o   . Wisdom tooth extraction      25 y/o   . Polypectomy      As a child multiple colonoscopy's for polyp removal  . Appendectomy    . Laparoscopic appendectomy N/A 05/23/2014    Procedure: APPENDECTOMY LAPAROSCOPIC;  Surgeon: Erroll Luna, MD;  Location: [place];  Service: General;  Laterality: N/A;  . Laparoscopic lysis of adhesions N/A 05/23/2014    Procedure: LAPAROSCOPIC LYSIS OF ADHESIONS;  Surgeon: Erroll Luna, MD;  Location: [place] OR;  Service: General;  Laterality: N/A;   Family History:  Family History  Problem Relation Age of Onset  . Alcoholism Father    Family Psychiatric  History: She has younger sister with methadone treatment.  Social History:  History  Alcohol Use No  History  Drug Use No    Comment: 05/23/14 - reports no use for last 4 months    Social History   Social History  . Marital Status: Single    Spouse Name: N/A  . Number of Children: N/A  . Years of Education: N/A   Social History Main Topics  . Smoking status: Current Every Day Smoker -- 1.00 packs/day    Types: Cigarettes  . Smokeless tobacco: Former Neurosurgeon   . Alcohol Use: No  . Drug Use: No     Comment: 05/23/14 - reports no use for last 4 months  . Sexual Activity: Yes    Birth Control/ Protection: Pill   Other Topics Concern  . None   Social History Narrative   Additional Social History: she lives with her BF x one year.     Allergies:   Allergies  Allergen Reactions  . Dilaudid [Hydromorphone Hcl] Rash    Rash and itching    Labs:  Results for orders placed or performed during the hospital encounter of 04/16/15 (from the past 48 hour(s))  CBC     Status: Abnormal   Collection Time: 04/18/15  3:07 AM  Result Value Ref Range   WBC 8.2 4.0 - 10.5 K/uL   RBC 3.77 (L) 3.87 - 5.11 MIL/uL   Hemoglobin 11.4 (L) 12.0 - 15.0 g/dL   HCT 76.8 (L) 11.5 - 72.6 %   MCV 91.5 78.0 - 100.0 fL   MCH 30.2 26.0 - 34.0 pg   MCHC 33.0 30.0 - 36.0 g/dL   RDW 20.3 (H) 55.9 - 74.1 %   Platelets 243 150 - 400 K/uL  Comprehensive metabolic panel     Status: Abnormal   Collection Time: 04/18/15  3:07 AM  Result Value Ref Range   Sodium 138 135 - 145 mmol/L   Potassium 3.5 3.5 - 5.1 mmol/L    Comment: DELTA CHECK NOTED REPEATED TO VERIFY    Chloride 111 101 - 111 mmol/L   CO2 22 22 - 32 mmol/L   Glucose, Bld 92 65 - 99 mg/dL   BUN <5 (L) 6 - 20 mg/dL   Creatinine, Ser 6.38 0.44 - 1.00 mg/dL   Calcium 8.4 (L) 8.9 - 10.3 mg/dL   Total Protein 5.5 (L) 6.5 - 8.1 g/dL   Albumin 3.0 (L) 3.5 - 5.0 g/dL   AST 28 15 - 41 U/L   ALT 18 14 - 54 U/L   Alkaline Phosphatase 49 38 - 126 U/L   Total Bilirubin 1.3 (H) 0.3 - 1.2 mg/dL   GFR calc non Af Amer >60 >60 mL/min   GFR calc Af Amer >60 >60 mL/min    Comment: (NOTE) The eGFR has been calculated using the CKD EPI equation. This calculation has not been validated in all clinical situations. eGFR's persistently <60 mL/min signify possible Chronic Kidney Disease.    Anion gap 5 5 - 15  Glucose, capillary     Status: None   Collection Time: 04/18/15  8:32 AM  Result Value Ref Range    Glucose-Capillary 91 65 - 99 mg/dL   Comment 1 Notify RN    Comment 2 Document in Chart     Current Facility-Administered Medications  Medication Dose Route Frequency Provider Last Rate Last Dose  . heparin injection 5,000 Units  5,000 Units Subcutaneous 3 times per day Vilinda Blanks Minor, NP   5,000 Units at 04/19/15 0600  . phenol (CHLORASEPTIC) mouth spray 1 spray  1 spray Mouth/Throat  PRN Geradine Girt, DO        Musculoskeletal: Strength & Muscle Tone: within normal limits Gait & Station: normal Patient leans: N/A  Psychiatric Specialty Exam: ROS  No Fever-chills, No Headache, No changes with Vision or hearing, reports vertigo No problems swallowing food or Liquids, No Chest pain, Cough or Shortness of Breath, No Abdominal pain, No Nausea or Vommitting, Bowel movements are regular, No Blood in stool or Urine, No dysuria, No new skin rashes or bruises, No new joints pains-aches,  No new weakness, tingling, numbness in any extremity, No recent weight gain or loss, No polyuria, polydypsia or polyphagia,   A full 10 point Review of Systems was done, except as stated above, all other Review of Systems were negative.  Blood pressure 117/73, pulse 73, temperature 98.4 F (36.9 C), temperature source Oral, resp. rate 18, height '5\' 4"'$  (1.626 m), weight 70.3 kg (154 lb 15.7 oz), SpO2 96 %.Body mass index is 26.59 kg/(m^2).  General Appearance: Casual  Eye Contact::  Good  Speech:  Clear and Coherent  Volume:  Normal  Mood:  Depressed  Affect:  Appropriate and Congruent  Thought Process:  Coherent and Goal Directed  Orientation:  Full (Time, Place, and Person)  Thought Content:  Paranoid Ideation and Rumination  Suicidal Thoughts:  Yes.  with intent/plan  Homicidal Thoughts:  No  Memory:  Immediate;   Good Recent;   Good Remote;   Fair  Judgement:  Impaired  Insight:  Present  Psychomotor Activity:  Normal  Concentration:  Good  Recall:  Good  Fund of Knowledge:Good   Language: Good  Akathisia:  Negative  Handed:  Right  AIMS (if indicated):     Assets:  Communication Skills Desire for Improvement Financial Resources/Insurance Housing Intimacy Leisure Time Physical Health Resilience Social Support Talents/Skills Transportation  ADL's:  Intact  Cognition: WNL  Sleep:      Treatment Plan Summary: Daily contact with patient to assess and evaluate symptoms and progress in treatment and Medication management  Patient meets criteria for acute psych admission Continue Kristen Schneider - can not contract for safety Monitor for Qtc prolongation and possible withdrawal symptoms. No psych medication for now Refer to LCSW for psych placement  Appreciate psychiatric consultation and we sign off as of today Please contact [phone] or [phone] if needs further assistance   Disposition: Recommend psychiatric Inpatient admission when medically cleared. Supportive therapy provided about ongoing stressors.  Durward Parcel., MD 04/19/2015 2:16 PM

## 2015-04-20 DIAGNOSIS — F1024 Alcohol dependence with alcohol-induced mood disorder: Secondary | ICD-10-CM

## 2015-04-20 DIAGNOSIS — F1210 Cannabis abuse, uncomplicated: Secondary | ICD-10-CM

## 2015-04-20 NOTE — Progress Notes (Signed)
PROGRESS NOTE  Kristen Schneider UJW:[id number] DOB: [birth year] DOA: 04/16/2015 PCP: No PCP Per Patient  [name] with bipolar disorder, drinks and does heroin, who ingested Prozac 68 tabs,cogentin 23 tabs, Zyprexa 23 tabs, ETOH level was 67,+ for marijuana benzo and told EMS she was attempting suicide. Intubated for airway protection and PCCM called to admit. She was intubated for airway protection.  poison control was contacted, and she was provided w/ supplemental IV hydration and continuous telemetry monitoring. She did indeed have: prolonged QTc and Non-anion gap metabolic acidosis. These both improved w/ supportive care and IV hydration. She was fully awake and successfully extubated on 3/23.    Assessment/Plan: OD with polysubstance's superimposed on bipolar dz and possible seizure d/o. This was an intentional ingestion.  Prolonged Qtc -->resolved Plan:  Medically cleared to go to inpatient psych when bed available  Code Status: full Family Communication: patient Disposition Plan:    Consultants:  PCCM  psych  Procedures:     HPI/Subjective: Feeling better   Objective: Filed Vitals:   04/19/15 1513 04/20/15 0538  BP: 110/67 101/57  Pulse: 85 70  Temp: 98.4 F (36.9 C) 98.2 F (36.8 C)  Resp: 18 16    Intake/Output Summary (Last 24 hours) at 04/20/15 1121 Last data filed at 04/20/15 0915  Gross per 24 hour  Intake    120 ml  Output      0 ml  Net    120 ml   Filed Weights   04/16/15 1500 04/17/15 0400 04/18/15 0608  Weight: 70.4 kg (155 lb 3.3 oz) 72.7 kg (160 lb 4.4 oz) 70.3 kg (154 lb 15.7 oz)    Exam:   General:  Awake, NAD  Musculoskeletal: no edema   Data Reviewed: Basic Metabolic Panel:  Recent Labs Lab 04/16/15 1046 04/16/15 1119 04/16/15 2032 04/17/15 0312 04/18/15 0307  NA 138  --  143 136 138  K 3.1*  --  2.9* 4.3 3.5  CL 106  --  112* 112* 111  CO2 22  --  20* 15* 22  GLUCOSE 99  --  110* 106* 92  BUN 9  --  5* <5*  <5*  CREATININE 0.61  --  0.62 0.53 0.51  CALCIUM 7.8*  --  8.0* 7.8* 8.4*  MG 1.8  --  1.5* 2.6*  --   PHOS  --  2.1* 2.5 2.3*  --    Liver Function Tests:  Recent Labs Lab 04/16/15 1046 04/16/15 2032 04/17/15 0312 04/18/15 0307  AST 19 28  --  28  ALT 14 17  --  18  ALKPHOS 52 54  --  49  BILITOT 0.4 1.0  --  1.3*  PROT 6.1* 6.1*  --  5.5*  ALBUMIN 3.6 3.5  3.7 3.3* 3.0*    Recent Labs Lab 04/16/15 1119  LIPASE 28  AMYLASE 37   No results for input(s): AMMONIA in the last 168 hours. CBC:  Recent Labs Lab 04/16/15 1122 04/17/15 0312 04/18/15 0307  WBC 11.6* 13.4* 8.2  NEUTROABS 10.1*  --   --   HGB 11.8* 11.7* 11.4*  HCT 36.8 34.7* 34.5*  MCV 92.9 87.2 91.5  PLT 258 269 243   Cardiac Enzymes: No results for input(s): CKTOTAL, CKMB, CKMBINDEX, TROPONINI in the last 168 hours. BNP (last 3 results) No results for input(s): BNP in the last 8760 hours.  ProBNP (last 3 results) No results for input(s): PROBNP in the last 8760 hours.  CBG:  Recent  Labs Lab 04/16/15 0945 04/18/15 0832  GLUCAP 111* 91    Recent Results (from the past 240 hour(s))  MRSA PCR Screening     Status: None   Collection Time: 04/16/15  2:53 PM  Result Value Ref Range Status   MRSA by PCR NEGATIVE NEGATIVE Final    Comment:        The GeneXpert MRSA Assay (FDA approved for NASAL specimens only), is one component of a comprehensive MRSA colonization surveillance program. It is not intended to diagnose MRSA infection nor to guide or monitor treatment for MRSA infections.      Studies: No results found.  Scheduled Meds: . heparin  5,000 Units Subcutaneous 3 times per day   Continuous Infusions:  Antibiotics Given (last 72 hours)    None      Principal Problem:   OD (overdose of drug) Active Problems:   Alcohol dependence with alcohol-induced mood disorder (HCC)   Cannabis abuse without complication    Time spent: 15 min    Kristen Schneider  [facility] Pager [phone]. If 7PM-7AM, please contact night-coverage at [email or web address], password [facility] 04/20/2015, 11:21 AM  LOS: 4 days

## 2015-04-21 LAB — URINALYSIS, ROUTINE W REFLEX MICROSCOPIC
BILIRUBIN URINE: NEGATIVE
Glucose, UA: NEGATIVE mg/dL
KETONES UR: NEGATIVE mg/dL
NITRITE: NEGATIVE
Protein, ur: 30 mg/dL — AB
SPECIFIC GRAVITY, URINE: 1.019 (ref 1.005–1.030)
pH: 7 (ref 5.0–8.0)

## 2015-04-21 LAB — URINE MICROSCOPIC-ADD ON

## 2015-04-21 MED ORDER — LORAZEPAM 2 MG/ML IJ SOLN
INTRAMUSCULAR | Status: AC
  Filled 2015-04-21: qty 1

## 2015-04-21 MED ORDER — CEFUROXIME AXETIL 500 MG PO TABS: 500.0000 mg | ORAL_TABLET | Freq: Two times a day (BID) | ORAL | Status: DC

## 2015-04-21 MED ORDER — CEFUROXIME AXETIL 500 MG PO TABS
500.0000 mg | ORAL_TABLET | Freq: Two times a day (BID) | ORAL | Status: DC
  Administered 2015-04-21 – 2015-04-22 (×2): 500 mg via ORAL
  Filled 2015-04-21 (×4): qty 1

## 2015-04-21 MED ORDER — LORAZEPAM 2 MG/ML IJ SOLN
1.0000 mg | Freq: Four times a day (QID) | INTRAMUSCULAR | Status: DC | PRN
  Administered 2015-04-21 – 2015-04-22 (×2): 1 mg via INTRAMUSCULAR
  Filled 2015-04-21 (×3): qty 1

## 2015-04-21 MED ORDER — LORAZEPAM 2 MG/ML IJ SOLN
1.0000 mg | Freq: Once | INTRAMUSCULAR | Status: AC
  Administered 2015-04-21: 1 mg via INTRAMUSCULAR

## 2015-04-21 NOTE — Progress Notes (Signed)
[facility] was agitated and wanting to leave the room and nsg unit. Pt was admitted 3/22 after an intentional overdose/suicide attempt. Pt was seen by psych while here and was slated for d/c to an acute psych facility tonight. However, there was some issue with staffing there, so d/c was post poned. Pt uncooperative to remain in her room and on the nsg unit. Security called. Pt IVC'd and papers faxed to magistrate by Erick BlinksAC, Sara.

## 2015-04-21 NOTE — Progress Notes (Signed)
CSW reached out to nurse to assist with transportation needs to [place]. However, nurse states patient cannot be accepted to [place] tonight due to their limited staff.  Trish MageBrittney Brean Carberry, LCSWA [phone] ED CSW 04/21/2015 10:16 PM

## 2015-04-21 NOTE — Progress Notes (Signed)
CSW assisting with d/c planning. [facility] contacted and will call CSW back with an update regarding bed availability.  Cori RazorJamie Green [place] LCSW [phone]

## 2015-04-21 NOTE — BH Assessment (Signed)
Patient has been accepted to [facility].  Accepting physician is Dr. Ardyth HarpsHernandez.  Attending Physician will be Dr. Ardyth HarpsHernandez.  Patient has been [facility] to room 303[facility]A, [facility] [place]hy[place]is .  Call report to [phone].  Representative/Transfer Coordinator is [employer]  [facility] [place] Staff (Asher MuirJamie, Social Worker-[phone]) made aware of acceptance. [facility] Staff (Catia, Social Work/TTS) made aware of acceptance.

## 2015-04-21 NOTE — Progress Notes (Signed)
Gave report to PlateaPhyllis[facility]. Left message if she had additional questions.

## 2015-04-21 NOTE — Progress Notes (Signed)
PROGRESS NOTE  Kristen DashJessica K Cloer JXB:[id number] DOB: 16-Mar-1990 DOA: 04/16/2015 PCP: No PCP Per Patient  [name] with bipolar disorder, drinks and does heroin, who ingested Prozac 68 tabs,cogentin 23 tabs, Zyprexa 23 tabs, ETOH level was 67,+ for marijuana benzo and told EMS she was attempting suicide. Intubated for airway protection and PCCM called to admit. She was intubated for airway protection. [place] poison control was contacted, and she was provided w/ supplemental IV hydration and continuous telemetry monitoring. She did indeed have: prolonged QTc and Non-anion gap metabolic acidosis. These both improved w/ supportive care and IV hydration. She was fully awake and successfully extubated on 3/23.    Assessment/Plan: OD with polysubstance's superimposed on bipolar dz and possible seizure d/o. This was an intentional ingestion.  Prolonged Qtc -->resolved  UTI- WBCs, some squamous epis -ceftin PO BID x 3 days  Plan:  Medically cleared to go to inpatient psych when bed available  Code Status: full Family Communication: patient Disposition Plan:    Consultants:  PCCM  psych  Procedures:     HPI/Subjective: Some burning with urination   Objective: Filed Vitals:   04/20/15 2108 04/21/15 0623  BP: 111/62 99/56  Pulse: 64 62  Temp: 98 F (36.7 C) 98.1 F (36.7 C)  Resp: 18 18    Intake/Output Summary (Last 24 hours) at 04/21/15 1328 Last data filed at 04/21/15 0829  Gross per 24 hour  Intake    480 ml  Output      0 ml  Net    480 ml   Filed Weights   04/16/15 1500 04/17/15 0400 04/18/15 0608  Weight: 70.4 kg (155 lb 3.3 oz) 72.7 kg (160 lb 4.4 oz) 70.3 kg (154 lb 15.7 oz)    Exam:   General:  Awake, NAD  Musculoskeletal: no edema in LE  Data Reviewed: Basic Metabolic Panel:  Recent Labs Lab 04/16/15 1046 04/16/15 1119 04/16/15 2032 04/17/15 0312 04/18/15 0307  NA 138  --  143 136 138  K 3.1*  --  2.9* 4.3 3.5  CL 106  --  112* 112* 111    CO2 22  --  20* 15* 22  GLUCOSE 99  --  110* 106* 92  BUN 9  --  5* <5* <5*  CREATININE 0.61  --  0.62 0.53 0.51  CALCIUM 7.8*  --  8.0* 7.8* 8.4*  MG 1.8  --  1.5* 2.6*  --   PHOS  --  2.1* 2.5 2.3*  --    Liver Function Tests:  Recent Labs Lab 04/16/15 1046 04/16/15 2032 04/17/15 0312 04/18/15 0307  AST 19 28  --  28  ALT 14 17  --  18  ALKPHOS 52 54  --  49  BILITOT 0.4 1.0  --  1.3*  PROT 6.1* 6.1*  --  5.5*  ALBUMIN 3.6 3.5  3.7 3.3* 3.0*    Recent Labs Lab 04/16/15 1119  LIPASE 28  AMYLASE 37   No results for input(s): AMMONIA in the last 168 hours. CBC:  Recent Labs Lab 04/16/15 1122 04/17/15 0312 04/18/15 0307  WBC 11.6* 13.4* 8.2  NEUTROABS 10.1*  --   --   HGB 11.8* 11.7* 11.4*  HCT 36.8 34.7* 34.5*  MCV 92.9 87.2 91.5  PLT 258 269 243   Cardiac Enzymes: No results for input(s): CKTOTAL, CKMB, CKMBINDEX, TROPONINI in the last 168 hours. BNP (last 3 results) No results for input(s): BNP in the last 8760 hours.  ProBNP (  last 3 results) No results for input(s): PROBNP in the last 8760 hours.  CBG:  Recent Labs Lab 04/16/15 0945 04/18/15 0832  GLUCAP 111* 91    Recent Results (from the past 240 hour(s))  MRSA PCR Screening     Status: None   Collection Time: 04/16/15  2:53 PM  Result Value Ref Range Status   MRSA by PCR NEGATIVE NEGATIVE Final    Comment:        The GeneXpert MRSA Assay (FDA approved for NASAL specimens only), is one component of a comprehensive MRSA colonization surveillance program. It is not intended to diagnose MRSA infection nor to guide or monitor treatment for MRSA infections.      Studies: No results found.  Scheduled Meds: . heparin  5,000 Units Subcutaneous 3 times per day   Continuous Infusions:  Antibiotics Given (last 72 hours)    None      Principal Problem:   OD (overdose of drug) Active Problems:   Alcohol dependence with alcohol-induced mood disorder (HCC)   Cannabis abuse  without complication    Time spent: 15 min    Aleyssa U [facility]  [facility] Pager [phone]. If 7PM-7AM, please contact night-coverage at [email or web address], password [facility] 04/21/2015, 1:28 PM  LOS: 5 days

## 2015-04-21 NOTE — Discharge Summary (Signed)
Physician Discharge Summary  Kristen DashJessica K Schneider ZOX:[id number] DOB: 1990-05-06 DOA: 04/16/2015  PCP: No PCP Per Patient  Admit date: 04/16/2015 Discharge date: 04/21/2015  Time spent: 35 minutes  Recommendations for Outpatient Follow-up:  1. ceftin until 3/30 2. inpt psych   Discharge Diagnoses:  Principal Problem:   OD (overdose of drug) Active Problems:   Alcohol dependence with alcohol-induced mood disorder (HCC)   Cannabis abuse without complication   Discharge Condition: improved  Diet recommendation: regular  Filed Weights   04/16/15 1500 04/17/15 0400 04/18/15 0608  Weight: 70.4 kg (155 lb 3.3 oz) 72.7 kg (160 lb 4.4 oz) 70.3 kg (154 lb 15.7 oz)    History of present illness:  [name] with bipolar disorder, drinks and does heroin, who ingested Prozac 68 tabs,cogentin 23 tabs, Zyprexa 23 tabs, ETOH level was 67,+ for marijuana benzo and told EMS she was attempting suicide. Intubated for airway protection and PCCM called to admit.  Hospital Course:  [name] with bipolar disorder, drinks and does heroin, who ingested Prozac 68 tabs,cogentin 23 tabs, Zyprexa 23 tabs, ETOH level was 67,+ for marijuana benzo and told EMS she was attempting suicide. Intubated for airway protection and PCCM called to admit. She was intubated for airway protection. [place] poison control was contacted, and she was provided w/ supplemental IV hydration and continuous telemetry monitoring. She did indeed have: prolonged QTc and Non-anion gap metabolic acidosis. These both improved w/ supportive care and IV hydration. She was fully awake and successfully extubated on 3/23.   OD with polysubstance's superimposed on bipolar dz and possible seizure d/o. This was an intentional ingestion.  Prolonged Qtc -->resolved  UTI- WBCs, some squamous epis -ceftin PO BID x 3 days   Procedures: intubation  Consultations:  Psych  PCCM  Discharge Exam: Filed Vitals:   04/21/15 0623 04/21/15 1442  BP:  99/56 95/51  Pulse: 62 63  Temp: 98.1 F (36.7 C) 98.3 F (36.8 C)  Resp: 18 16      Discharge Instructions   Discharge Instructions    Diet general    Complete by:  As directed      Discharge instructions    Complete by:  As directed   ceftin until 3/30     Increase activity slowly    Complete by:  As directed           Current Discharge Medication List    START taking these medications   Details  cefUROXime (CEFTIN) 500 MG tablet Take 1 tablet (500 mg total) by mouth 2 (two) times daily with a meal.      STOP taking these medications     benztropine (COGENTIN) 0.5 MG tablet      FLUoxetine (PROZAC) 20 MG capsule      OLANZapine (ZYPREXA) 5 MG tablet      docusate sodium (COLACE) 100 MG capsule      esomeprazole (NEXIUM) 40 MG capsule      naltrexone (DEPADE) 50 MG tablet      oxyCODONE-acetaminophen (PERCOCET/ROXICET) 5-325 MG per tablet      PARoxetine (PAXIL) 10 MG tablet      polyethylene glycol powder (GLYCOLAX/MIRALAX) powder      QUEtiapine (SEROQUEL) 25 MG tablet        Allergies  Allergen Reactions  . Dilaudid [Hydromorphone Hcl] Rash    Rash and itching      The results of significant diagnostics from this hospitalization (including imaging, microbiology, ancillary and laboratory) are listed below  for reference.    Significant Diagnostic Studies: Dg Chest Portable 1 View  04/16/2015  CLINICAL DATA:  Overdose, endotracheal tube placement EXAM: PORTABLE CHEST 1 VIEW COMPARISON:  11/02/2011 FINDINGS: Cardiomediastinal silhouette is unremarkable. NG tube in place. Endotracheal tube in place with tip 3.7 cm above the carina. There is no pneumothorax. No infiltrate or pulmonary edema. IMPRESSION: No infiltrate or pulmonary edema. NG tube and endotracheal tube in place. No pneumothorax. Electronically Signed   By: Natasha Mead M.D.   On: 04/16/2015 10:24    Microbiology: Recent Results (from the past 240 hour(s))  MRSA PCR Screening      Status: None   Collection Time: 04/16/15  2:53 PM  Result Value Ref Range Status   MRSA by PCR NEGATIVE NEGATIVE Final    Comment:        The GeneXpert MRSA Assay (FDA approved for NASAL specimens only), is one component of a comprehensive MRSA colonization surveillance program. It is not intended to diagnose MRSA infection nor to guide or monitor treatment for MRSA infections.      Labs: Basic Metabolic Panel:  Recent Labs Lab 04/16/15 1046 04/16/15 1119 04/16/15 2032 04/17/15 0312 04/18/15 0307  NA 138  --  143 136 138  K 3.1*  --  2.9* 4.3 3.5  CL 106  --  112* 112* 111  CO2 22  --  20* 15* 22  GLUCOSE 99  --  110* 106* 92  BUN 9  --  5* <5* <5*  CREATININE 0.61  --  0.62 0.53 0.51  CALCIUM 7.8*  --  8.0* 7.8* 8.4*  MG 1.8  --  1.5* 2.6*  --   PHOS  --  2.1* 2.5 2.3*  --    Liver Function Tests:  Recent Labs Lab 04/16/15 1046 04/16/15 2032 04/17/15 0312 04/18/15 0307  AST 19 28  --  28  ALT 14 17  --  18  ALKPHOS 52 54  --  49  BILITOT 0.4 1.0  --  1.3*  PROT 6.1* 6.1*  --  5.5*  ALBUMIN 3.6 3.5  3.7 3.3* 3.0*    Recent Labs Lab 04/16/15 1119  LIPASE 28  AMYLASE 37   No results for input(s): AMMONIA in the last 168 hours. CBC:  Recent Labs Lab 04/16/15 1122 04/17/15 0312 04/18/15 0307  WBC 11.6* 13.4* 8.2  NEUTROABS 10.1*  --   --   HGB 11.8* 11.7* 11.4*  HCT 36.8 34.7* 34.5*  MCV 92.9 87.2 91.5  PLT 258 269 243   Cardiac Enzymes: No results for input(s): CKTOTAL, CKMB, CKMBINDEX, TROPONINI in the last 168 hours. BNP: BNP (last 3 results) No results for input(s): BNP in the last 8760 hours.  ProBNP (last 3 results) No results for input(s): PROBNP in the last 8760 hours.  CBG:  Recent Labs Lab 04/16/15 0945 04/18/15 0832  GLUCAP 111* 91       Signed:  Roxan U Stephenie Navejas DO.  [facility] 04/21/2015, 4:17 PM

## 2015-04-21 NOTE — Progress Notes (Signed)
CSW assisting with d/c planning. Pt has a in pt psych bed at [facility] tonite between 8:00 pm-9:00 PM. Voluntary form completed. Original is in chart. Copy has been faxed to Cal at [facility]. ED CSW [phone]( [phone] will assist with transport once Cal from [place] contacts her  to confirm time / rm.  Cori RazorJamie Manuelita Moxon LCSW [phone]

## 2015-04-22 ENCOUNTER — Inpatient Hospital Stay
Admission: EM | Admit: 2015-04-22 | Discharge: 2015-04-28 | DRG: 881 | Disposition: A | Discharge status: Home / Self Care | Payer: Medicaid Other | Source: Intra-hospital | Attending: Psychiatry | Admitting: Psychiatry

## 2015-04-22 DIAGNOSIS — Z635 Disruption of family by separation and divorce: Secondary | ICD-10-CM

## 2015-04-22 DIAGNOSIS — Z9889 Other specified postprocedural states: Secondary | ICD-10-CM | POA: Diagnosis not present

## 2015-04-22 DIAGNOSIS — F329 Major depressive disorder, single episode, unspecified: Secondary | ICD-10-CM | POA: Diagnosis present

## 2015-04-22 DIAGNOSIS — Z653 Problems related to other legal circumstances: Secondary | ICD-10-CM | POA: Diagnosis not present

## 2015-04-22 DIAGNOSIS — F603 Borderline personality disorder: Secondary | ICD-10-CM

## 2015-04-22 DIAGNOSIS — F1220 Cannabis dependence, uncomplicated: Secondary | ICD-10-CM | POA: Diagnosis present

## 2015-04-22 DIAGNOSIS — F1510 Other stimulant abuse, uncomplicated: Secondary | ICD-10-CM | POA: Diagnosis present

## 2015-04-22 DIAGNOSIS — F1020 Alcohol dependence, uncomplicated: Secondary | ICD-10-CM | POA: Diagnosis present

## 2015-04-22 DIAGNOSIS — G4710 Hypersomnia, unspecified: Secondary | ICD-10-CM | POA: Diagnosis present

## 2015-04-22 DIAGNOSIS — F17210 Nicotine dependence, cigarettes, uncomplicated: Secondary | ICD-10-CM | POA: Diagnosis present

## 2015-04-22 DIAGNOSIS — F1321 Sedative, hypnotic or anxiolytic dependence, in remission: Secondary | ICD-10-CM

## 2015-04-22 DIAGNOSIS — Z885 Allergy status to narcotic agent status: Secondary | ICD-10-CM

## 2015-04-22 DIAGNOSIS — N390 Urinary tract infection, site not specified: Secondary | ICD-10-CM | POA: Diagnosis present

## 2015-04-22 DIAGNOSIS — Z9049 Acquired absence of other specified parts of digestive tract: Secondary | ICD-10-CM | POA: Diagnosis not present

## 2015-04-22 DIAGNOSIS — Z915 Personal history of self-harm: Secondary | ICD-10-CM

## 2015-04-22 DIAGNOSIS — G4700 Insomnia, unspecified: Secondary | ICD-10-CM | POA: Diagnosis present

## 2015-04-22 DIAGNOSIS — F1120 Opioid dependence, uncomplicated: Secondary | ICD-10-CM | POA: Diagnosis present

## 2015-04-22 DIAGNOSIS — F319 Bipolar disorder, unspecified: Secondary | ICD-10-CM | POA: Diagnosis present

## 2015-04-22 DIAGNOSIS — F17200 Nicotine dependence, unspecified, uncomplicated: Secondary | ICD-10-CM | POA: Diagnosis present

## 2015-04-22 DIAGNOSIS — E538 Deficiency of other specified B group vitamins: Secondary | ICD-10-CM

## 2015-04-22 DIAGNOSIS — F1420 Cocaine dependence, uncomplicated: Secondary | ICD-10-CM | POA: Diagnosis present

## 2015-04-22 DIAGNOSIS — F1320 Sedative, hypnotic or anxiolytic dependence, uncomplicated: Secondary | ICD-10-CM | POA: Diagnosis present

## 2015-04-22 DIAGNOSIS — Z811 Family history of alcohol abuse and dependence: Secondary | ICD-10-CM

## 2015-04-22 DIAGNOSIS — Z62810 Personal history of physical and sexual abuse in childhood: Secondary | ICD-10-CM | POA: Diagnosis present

## 2015-04-22 MED ORDER — CEFUROXIME AXETIL 500 MG PO TABS
500.0000 mg | ORAL_TABLET | Freq: Two times a day (BID) | ORAL | Status: DC
  Administered 2015-04-22 – 2015-04-23 (×2): 500 mg via ORAL
  Filled 2015-04-22 (×2): qty 1

## 2015-04-22 MED ORDER — LORAZEPAM 2 MG/ML IJ SOLN
1.0000 mg | INTRAMUSCULAR | Status: AC
  Administered 2015-04-22: 1 mg via INTRAVENOUS

## 2015-04-22 MED ORDER — HYDROXYZINE HCL 25 MG PO TABS
25.0000 mg | ORAL_TABLET | Freq: Three times a day (TID) | ORAL | Status: DC | PRN
  Administered 2015-04-22 – 2015-04-23 (×2): 25 mg via ORAL
  Filled 2015-04-22 (×2): qty 1

## 2015-04-22 MED ORDER — ALUM & MAG HYDROXIDE-SIMETH 200-200-20 MG/5ML PO SUSP: 30.0000 mL | ORAL | Status: DC | PRN

## 2015-04-22 MED ORDER — NICOTINE 21 MG/24HR TD PT24: 21.0000 mg | MEDICATED_PATCH | Freq: Every day | TRANSDERMAL | Status: DC | PRN

## 2015-04-22 MED ORDER — ACETAMINOPHEN 325 MG PO TABS: 650.0000 mg | ORAL_TABLET | Freq: Four times a day (QID) | ORAL | Status: DC | PRN

## 2015-04-22 MED ORDER — MAGNESIUM HYDROXIDE 400 MG/5ML PO SUSP: 30.0000 mL | Freq: Every day | ORAL | Status: DC | PRN

## 2015-04-22 NOTE — Progress Notes (Signed)
Admission Note:  D: Pt appeared depressed  With  a flat affect.  Pt  denies SI / AVH at this time. Pt has allergy to Dilaud.   Voice of taking overdose . substance abuse issues. Voice of grieving over her mother wanting to be with her . Patient mother has been dead for14 years   Pt is redirectable and cooperative with assessment.      A: Pt admitted to unit per protocol, skin assessment and search done and no contraband found.  Pt  educated on therapeutic milieu rules. Pt was introduced to milieu by nursing staff.    R: Pt was receptive to education about the milieu .  15 min safety checks started. Clinical research associatewriter offered support

## 2015-04-22 NOTE — Progress Notes (Signed)
CSW assisting with d/c planning. Pt was IVC 04/21/15. Pt is ready for d/c. Pt transported to [facility], by sheriff, today.  Cori RazorJamie Alfred Harrel LCSW [phone]

## 2015-04-22 NOTE — Progress Notes (Signed)
Nutrition Follow-up  DOCUMENTATION CODES:   Not applicable  INTERVENTION:  - Continue Regular diet  NUTRITION DIAGNOSIS:   Inadequate oral intake related to inability to eat as evidenced by NPO status. -resolved  No new nutrition dx.  GOAL:   Patient will meet greater than or equal to 90% of their needs -met  MONITOR:   Vent status, Labs, Weight trends, I & O's  REASON FOR ASSESSMENT:   Ventilator    ASSESSMENT:   [name] with bipolar disorder, drinks and does heroin, who ingested Prozac 68 tabs,cogentin 23 tabs, Zyprexa 23 tabs, ETOH level was 67,+ for marijuana benzo and told EMS she was attempting suicide. Intubated for airway protection  Pt last seen 04/18/15. Since that time, pt has been eating 50-100% of all meals. No nutrition-related needs at this time. Pt meeting needs. Medications reviewed. Labs reviewed; BUN <5 mg/dL, Ca: 8.4 mg/dL.  Diet Order:  Diet regular Room service appropriate?: Yes; Fluid consistency:: Thin Diet general  Skin:  Reviewed, no issues  Last BM:  PTA  Height:   Ht Readings from Last 1 Encounters:  04/16/15 _0  (1.626 m)    Weight:   Wt Readings from Last 1 Encounters:  04/18/15 154 lb 15.7 oz (70.3 kg)    Ideal Body Weight:  54.5 kg  BMI:  Body mass index is 26.59 kg/(m^2).  Estimated Nutritional Needs:   Kcal:  1400-1750 calories  Protein:  70-85 grams  Fluid:  2L/day  EDUCATION NEEDS:   No education needs identified at this time     Jarome Matin, RD, LDN Inpatient Clinical Dietitian Pager # [phone] After hours/weekend pager # [phone]

## 2015-04-22 NOTE — Progress Notes (Signed)
[place] to [place] at [facility]. Left number if she had additional questions.

## 2015-04-22 NOTE — Plan of Care (Signed)
Problem: Ineffective individual coping Goal: LTG: Patient will report a decrease in negative feelings Outcome: Not Progressing New admission, unable to identify positive  Feeling at present

## 2015-04-22 NOTE — Progress Notes (Signed)
Patient was set to be transported to [facility] by [facility]. Before patient was able to leave her room, the receiving facility called to inform us that they could not take the patient due to low staffing. Patient remained in her room. I will continue to monitor during this shift. Patient also stated that she did not want [facility] to transport her, but wanted her loved one to take that task, and would not go unless he could take her. She also stated she wanted to be able to go to the street to smoke then come back. [facility] was informed of this and came up to talk to the patient. The patient became increasingly upset and security was called. Ativan was given and the patient was able to calm down. Mechanical restraints were not needed and the on call was informed of this (stated they would be PRN). Patient is currently calm and cooperative with sitter in the room.

## 2015-04-22 NOTE — Progress Notes (Signed)
Recreation Therapy Notes  Date: 03.28.17 Time: 3:00 pm Location: Craft Room  Group Topic: Goal Setting  Goal Area(s) Addresses:  Patient will write at least one goal. Patient will write at least one obstacle.  Behavioral Response: Attentive, Interactive  Intervention: Recovery Goal Chart  Activity: Patients were instructed to make a Recovery Goal Chart including goals, obstacles, the date they started working on their goals, and the date they achieved their goals.  Education: LRT educated patients on healthy ways to celebrate reaching their goals.  Education Outcome: Acknowledges education/In group clarification offered  Clinical Observations/Feedback: Patient completed activity by writing goals and obstacles. Patient contributed to group discussion by stating how she can keep herself focused on her goals and how she can overcome her obstacles.  Jacquelynn CreeGreene,Makoto Sellitto M, LRT/CTRS 04/22/2015 4:28 PM

## 2015-04-22 NOTE — [facility] Group Notes (Signed)
[facility] LCSW Group Therapy   04/22/2015 11am  Type of Therapy: Group Therapy   Participation Level: Did Not Attend. Patient invited to participate but declined.    Tobie Hellen F. Sheray Grist, MSW, LCSWA, LCAS   

## 2015-04-22 NOTE — Tx Team (Signed)
Initial Interdisciplinary Treatment Plan   PATIENT STRESSORS: Medication change or noncompliance Traumatic event  overdose   PATIENT STRENGTHS: Ability for insight Active sense of humor Communication skills General fund of knowledge Supportive family/friends   PROBLEM LIST: Problem List/Patient Goals Date to be addressed Date deferred Reason deferred Estimated date of resolution  Suicidal  04/22/15     Depression 04/22/15     Bipolar 04/22/15                                          DISCHARGE CRITERIA:  Ability to meet basic life and health needs Safe-care adequate arrangements made  PRELIMINARY DISCHARGE PLAN: Outpatient therapy Return to previous living arrangement  PATIENT/FAMIILY INVOLVEMENT: This treatment plan has been presented to and reviewed with the patient, Darral DashJessica K Lukacs, and/or family member,   The patient and family have been given the opportunity to ask questions and make suggestions.  Kalven Ganim A Tajh Livsey 04/22/2015, 5:10 PM
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
## 2015-04-23 DIAGNOSIS — F1220 Cannabis dependence, uncomplicated: Secondary | ICD-10-CM

## 2015-04-23 DIAGNOSIS — F1510 Other stimulant abuse, uncomplicated: Secondary | ICD-10-CM

## 2015-04-23 DIAGNOSIS — F332 Major depressive disorder, recurrent severe without psychotic features: Secondary | ICD-10-CM

## 2015-04-23 DIAGNOSIS — F1321 Sedative, hypnotic or anxiolytic dependence, in remission: Secondary | ICD-10-CM

## 2015-04-23 DIAGNOSIS — F1120 Opioid dependence, uncomplicated: Secondary | ICD-10-CM

## 2015-04-23 DIAGNOSIS — F1420 Cocaine dependence, uncomplicated: Secondary | ICD-10-CM

## 2015-04-23 DIAGNOSIS — F1020 Alcohol dependence, uncomplicated: Secondary | ICD-10-CM

## 2015-04-23 LAB — TSH: TSH: 0.872 u[IU]/mL (ref 0.350–4.500)

## 2015-04-23 LAB — HCG, QUANTITATIVE, PREGNANCY: hCG, Beta Chain, Quant, S: 1 m[IU]/mL (ref <5)

## 2015-04-23 MED ORDER — GABAPENTIN 400 MG PO CAPS
400.0000 mg | ORAL_CAPSULE | Freq: Three times a day (TID) | ORAL | Status: DC
  Administered 2015-04-23 – 2015-04-24 (×3): 400 mg via ORAL
  Filled 2015-04-23 (×3): qty 1

## 2015-04-23 MED ORDER — NALTREXONE HCL 50 MG PO TABS
25.0000 mg | ORAL_TABLET | Freq: Every day | ORAL | Status: DC
  Administered 2015-04-23 – 2015-04-28 (×6): 25 mg via ORAL
  Filled 2015-04-23 (×7): qty 1

## 2015-04-23 MED ORDER — HYDROXYZINE HCL 50 MG PO TABS
50.0000 mg | ORAL_TABLET | Freq: Three times a day (TID) | ORAL | Status: DC | PRN
  Administered 2015-04-23 – 2015-04-24 (×3): 50 mg via ORAL
  Filled 2015-04-23 (×3): qty 1

## 2015-04-23 MED ORDER — NICOTINE 21 MG/24HR TD PT24
21.0000 mg | MEDICATED_PATCH | Freq: Every day | TRANSDERMAL | Status: DC
  Filled 2015-04-23 (×5): qty 1

## 2015-04-23 MED ORDER — GABAPENTIN 800 MG PO TABS
400.0000 mg | ORAL_TABLET | Freq: Three times a day (TID) | ORAL | Status: DC
  Filled 2015-04-23 (×2): qty 0.5

## 2015-04-23 MED ORDER — CEFUROXIME AXETIL 500 MG PO TABS
500.0000 mg | ORAL_TABLET | Freq: Two times a day (BID) | ORAL | Status: AC
  Administered 2015-04-23 – 2015-04-25 (×4): 500 mg via ORAL
  Filled 2015-04-23 (×4): qty 1

## 2015-04-23 MED ORDER — SERTRALINE HCL 25 MG PO TABS
25.0000 mg | ORAL_TABLET | Freq: Every day | ORAL | Status: DC
  Administered 2015-04-23 – 2015-04-25 (×3): 25 mg via ORAL
  Filled 2015-04-23 (×3): qty 1

## 2015-04-23 NOTE — Progress Notes (Signed)
Recreation Therapy Notes  INPATIENT RECREATION THERAPY ASSESSMENT  Patient Details Name: Kristen Schneider MRN: [id number] DOB: 02-26-1990 Today's Date: 04/23/2015  Patient Stressors: Death, Work, Other (Comment) (Mom died 10 years ago in December - never got to say bye thinks about her every day; lost job due to OD; anxiety levels)  Coping Skills:   Isolate, Arguments, Substance Abuse, Art/Dance, Talking, Music, Other (Comment) (Distance self, read, sleep)  Personal Challenges: Anger, Communication, Concentration, Decision-Making, Expressing Yourself, Relationships, Self-Esteem/Confidence, Social Interaction, Stress Management, Substance Abuse, Trusting Others  Leisure Interests (2+):  Games - [employer] games, Individual - Other (Comment) (Read)  Awareness of Community Resources:  No  Community Resources:     Current Use:    If no, Barriers?:    Patient Strengths:  Color of eyes  Patient Identified Areas of Improvement:  Attitude, depression, anger  Current Recreation Participation:  Reading  Patient Goal for Hospitalization:  To get in control of anger and depression  [place]  [place] Residence[place]   Current [place] (including self-harm):  No  Current HI:  No  Consent to Intern Participation: N/A   Jacquelynn CreeGreene,Hashir Deleeuw M, LRT/CTRS 04/23/2015, 4:16 PM

## 2015-04-23 NOTE — Plan of Care (Signed)
Problem: Diagnosis: Increased Risk For Suicide Attempt Goal: LTG-Patient Family Informed of Increased Suicide Risk LTG (by discharge) Patient's family or significant other informed of patient's increased risk for suicide and they will be able to verbalize ways they can help the patient stay safe after discharge.  Outcome: Progressing Denies suicidal intent.        

## 2015-04-23 NOTE — Progress Notes (Signed)
D: Patient voice of working on controlling her  Temper .  Working on deep breathing. patient stated slept fair last night .Stated fairis good and energy level  Is normal. Stated concentration is good . Stated on Depression scale 7, hopeless 2 and anxiety 10 .( low 0-10 high) Denies suicidal  homicidal ideations  .  No auditory hallucinations  No pain concerns . Appropriate ADL'S. Interacting with peers and staff.  A: Encourage patient participation with unit programming . Instruction  Given on  Medication , verbalize understanding. R: Voice no other concerns. Staff continue to monitor

## 2015-04-23 NOTE — Progress Notes (Signed)
Recreation Therapy Notes  Date: 03.29.17 Time: 3:00 pm Location: Craft Room  Group Topic: Self-esteem  Goal Area(s) Addresses:  Patient will be able to identify benefit of self-esteem. Patient will be able to identify ways to increase self-esteem.  Behavioral Response: Attentive, Interactive  Intervention: Self-Portrait  Activity: Patients were instructed to draw their self-portrait of how they felt. Afterwards, patients were instructed to write their name and positive trait about themselves. They passed their papers around and wrote positive traits about their peers. Patient read what their peers wrote and drew their self-portrait of how they felt after reading the positive traits.  Education: LRT educated patients on ways they can increase their self-esteem.  Education Outcome: Acknowledges education/In group clarification offered   Clinical Observations/Feedback: Patient completed activity by drawing both self-portraits, writing a positive trait about herself, and positive traits about peers. Patient contributed to group discussion by stating how her faces are different, how she felt after reading the positive comments from peers, that it was a little difficult to believe them and why, and how she can increase her self-esteem.  Jacquelynn CreeGreene,Gentry Seeber M, LRT/CTRS 04/23/2015 3:53 PM
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
## 2015-04-23 NOTE — Plan of Care (Addendum)
Problem: Ineffective individual coping Goal: STG: Patient will remain free from self harm Outcome: Progressing Patient has no scheduled medications at bedtime, no PRN given, 15 minute checks maintained for safety, clinical and moral support provided, patient encouraged to continue to express feelings and demonstrate safe care. Patient remain free from harm, will continue to monitor.

## 2015-04-23 NOTE — [facility] Group Notes (Signed)
[facility] Group Notes:  (Nursing/MHT/Case Management/Adjunct)  Date:  04/23/2015  Time:  1:50 PM  Type of Therapy:  Group Therapy  Participation Level:  Active  Participation Quality:  Appropriate  Affect:  Appropriate  Cognitive:  Alert  Insight:  Good  Engagement in Group:  Engaged  Modes of Intervention:  Support  Summary of Progress/Problems:  Mayra NeerJackie L Anjelina Dung 04/23/2015, 1:50 PM

## 2015-04-23 NOTE — Progress Notes (Signed)
Kristen Schneider's mood and affect are bright, congruent, appropriate, improved, happy and observed in the day room playing cards with peers; was able to come out and talked with me: "I am not mad at my boyfriend anymore, I got over it .Marland Kitchen..." Denied pain, denied SI/HI/AVH. Will continue with POC.

## 2015-04-23 NOTE — [facility] Group Notes (Signed)
[facility] Group Notes:  (Nursing/MHT/Case Management/Adjunct)  Date:  04/23/2015  Time:  11:12 PM  Type of Therapy:  Evening Wrap-up Group  Participation Level:  Active  Participation Quality:  Appropriate and Attentive  Affect:  Appropriate  Cognitive:  Alert and Appropriate  Insight:  Appropriate and Improving  Engagement in Group:  Developing/Improving  Modes of Intervention:  Discussion  Summary of Progress/Problems:  Kristen MorrowChelsea Nanta Mulki Schneider 04/23/2015, 11:12 PM

## 2015-04-23 NOTE — [facility] Group Notes (Signed)
[facility] LCSW Aftercare Discharge Planning Group Note  04/23/2015 9:30 AM  Participation Quality: Did Not Attend. Patient invited to participate but declined.   Erikah Thumm F. Mauriana Dann, MSW, LCSWA, LCAS   

## 2015-04-23 NOTE — Progress Notes (Signed)
Patient has been sleeping since the beginning of shift, was reactive and responded to questions asked and went back to sleep. Did not say much, A&Ox3, denied SI/HI at this time, room closer to the nurses' station for frequent observations.

## 2015-04-23 NOTE — [facility] Suicide Risk Assessment (Signed)
[facility]   Nursing information obtained from:    Demographic factors:    Current Mental Status:    Loss Factors:    Historical Factors:    Risk Reduction Factors:     Total Time spent with patient: 1 hour Principal Problem: <principal problem not specified> Diagnosis:   Patient Active Problem List   Diagnosis Date Noted  . Alcohol use disorder, severe, dependence (HCC) [F10.20] 04/23/2015  . Cannabis use disorder, moderate, dependence (HCC) [F12.20] 04/23/2015  . Cocaine use disorder, moderate, dependence (HCC) [F14.20] 04/23/2015  . Opioid use disorder, moderate, dependence (HCC) [F11.20] 04/23/2015  . Amphetamine abuse [F15.10] 04/23/2015  . MDD (major depressive disorder) (HCC) [F32.9] 04/22/2015  . Tobacco use disorder [F17.200] 05/30/2014   Subjective Data:   Continued Clinical Symptoms:  Alcohol Use Disorder Identification Test Final Score (AUDIT): 3 The "Alcohol Use Disorders Identification Test", Guidelines for Use in Primary Care, Second Edition.  World Science writerHealth Organization [place](WHO). Score between 0-7:  no or low risk or alcohol related problems. Score between 8-15:  moderate risk of alcohol related problems. Score between 16-19:  high risk of alcohol related problems. Score 20 or above:  warrants further diagnostic evaluation for alcohol dependence and treatment.   CLINICAL FACTORS:   Depression:   Comorbid alcohol abuse/dependence Impulsivity Alcohol/Substance Abuse/Dependencies Chronic Pain Previous Psychiatric Diagnoses and Treatments    Psychiatric Specialty Exam: ROS  Blood pressure 117/71, pulse 68, temperature 98 F (36.7 C), temperature source Oral, resp. rate 18, height 5\' 5"  (1.651 m), weight 67.586 kg (149 lb), SpO2 97 %.Body mass index is 24.79 kg/(m^2).                                                        COGNITIVE FEATURES THAT CONTRIBUTE TO RISK:  None    SUICIDE RISK:   Moderate:   Frequent suicidal ideation with limited intensity, and duration, some specificity in terms of plans, no associated intent, good self-control, limited dysphoria/symptomatology, some risk factors present, and identifiable protective factors, including available and accessible social support.  PL[facility] OF CARE[facility]  I certify that inpatient services furnished can reasonably be expected to improve the patient's condition.   Jimmy FootmanHernandez-Gonzalez,  Eila Runyan, MD 04/23/2015, 1:20 PM

## 2015-04-24 LAB — VITAMIN B12: Vitamin B-12: 226 pg/mL (ref 180–914)

## 2015-04-24 MED ORDER — HYDROXYZINE HCL 25 MG PO TABS
25.0000 mg | ORAL_TABLET | Freq: Three times a day (TID) | ORAL | Status: DC | PRN
  Administered 2015-04-24 – 2015-04-28 (×10): 25 mg via ORAL
  Filled 2015-04-24 (×11): qty 1

## 2015-04-24 MED ORDER — GABAPENTIN 300 MG PO CAPS
300.0000 mg | ORAL_CAPSULE | Freq: Two times a day (BID) | ORAL | Status: DC
  Administered 2015-04-24 – 2015-04-28 (×8): 300 mg via ORAL
  Filled 2015-04-24 (×8): qty 1

## 2015-04-24 NOTE — Progress Notes (Signed)
Recreation Therapy Notes  Date: 03.30.17 Time: 3:00 pm Location: Craft Room  Group Topic: Leisure Education  Goal Area(s) Addresses:  Patient will identify activities for each letter of the alphabet. Patient will verbalize ability to integrate leisure into life post d/c. Patient will verbalize ability to use leisure as a Associate Professorcoping skill.  Behavioral Response: Attentive, Interactive  Intervention: Leisure Alphabet  Activity: Patients were given a leisure alphabet and were instructed to list a healthy leisure activity for each letter of the alphabet.  Education: LRT educated group on how they can integrate leisure in their schedule.  Education Outcome: Acknowledges education/In group clarification offered   Clinical Observations/Feedback: Patient completed activity by writing down healthy leisure activities. Patient contributed to group discussion by stating healthy leisure activities.  Jacquelynn CreeGreene,Wandra Babin M, LRT/CTRS 04/24/2015 4:21 PM

## 2015-04-24 NOTE — Progress Notes (Signed)
Aware of need to work on Presenter, broadcastinganger management Skills.Goal for  Today Coping skills sand breathing D: Patient stated slept good last night .Stated appetite fair and energy level  Is lowl. Stated concentration is good . Stated on Depression scale 3 , hopeless 1 and anxiety 9 .( low 0-10 high) Denies suicidal  homicidal ideations  .  No auditory hallucinations  No pain concerns . Appropriate ADL'S. Interacting with peers and staff.  A: Encourage patient participation with unit programming . Instruction  Given on  Medication , verbalize understanding. R: Voice no other concerns. Staff continue to monitor

## 2015-04-24 NOTE — Plan of Care (Signed)
Problem: Ineffective individual coping Goal: STG: Patient will remain free from self harm Outcome: Progressing Medications administered as ordered by the physician, medications Therapeutic Effects, SEs and Adverse effects discussed, questions encouraged; Vistaril 50 mg PO PRN, anxiety, given with bedtime medication; 15 minute checks maintained for safety, clinical and moral support provided, patient encouraged to continue to express feelings and demonstrate safe care. Patient remain free from harm, will continue to monitor.

## 2015-04-24 NOTE — Plan of Care (Signed)
Problem: Ineffective individual coping Goal: STG: Patient will remain free from self harm Outcome: Progressing Vistaril 25 mg PO PRN given for anxiety with Gabapentin 300 mg at bedtime, 15 minute checks maintained for safety, clinical and moral support provided, patient encouraged to continue to express feelings and demonstrate safe care. Patient remain free from harm, will continue to monitor.

## 2015-04-24 NOTE — Plan of Care (Signed)
Problem: Alteration in mood; excessive anxiety as evidenced by: Goal: LTG-Patient's behavior demonstrates decreased anxiety (Patient's behavior demonstrates anxiety and he/she is utilizing learned coping skills to deal with anxiety-producing situations)  Outcome: Progressing Working on coping skills for situations  With no control.

## 2015-04-24 NOTE — [facility] Group Notes (Signed)
[facility] LCSW Group Therapy   04/24/2015 11am   Type of Therapy: Group Therapy   Participation Level: Active   Participation Quality: Attentive, Sharing and Supportive   Affect: Appropriate   Cognitive: Alert and Oriented   Insight: Developing/Improving and Engaged   Engagement in Therapy: Developing/Improving and Engaged   Modes of Intervention: Clarification, Confrontation, Discussion, Education, Exploration, Limit-setting, Orientation, Problem-solving, Rapport Building, Dance movement psychotherapisteality Testing, Socialization and Support   Summary of Progress/Problems: The topic for group was balance in life. Today's group focused on defining balance in one's own words, identifying things that can knock one off balance, and exploring healthy ways to maintain balance in life. Group members were asked to provide an example of a time when they felt off balance, describe how they handled that situation, and process healthier ways to regain balance in the future. Group members were asked to share the most important tool for maintaining balance that they learned [facility] and how they plan to apply this method after discharge. Pt shared that for the pt balance means getting rest, but not sleeping in excess and also feeling energetic enough to accomplish daily tasks.  Pt shared that recently the pt has felt off balance due to pt not being able to fulfill her obligations and duties in the household.  Pt shared that when the pt feels off balance she manages this difficulty by scheduling important activities around her sleep schedule.  [facility] the [facility] has maintained a daily schedule and that this has been helpful to the pt.   CSW actively validated the pt's opinion and provided feedback.  Pt was polite and cooperative with the CSW and other group members and focused and attentive to the topics discussed and the sharing of others.

## 2015-04-24 NOTE — Progress Notes (Signed)
[facility] Progress Note  04/24/2015 11:27 AM Kristen Schneider  MRN:  [id number] Subjective:  Patient reports doing "okay" this morning. She denies suicidality, homicidality or having auditory or visual hallucinations. She is started new medications yesterday and denies having any side effects or problems with the medications. The patient states that she slept well throughout the night but is still feels tired and sleepy. She says this is a problem that she had even before she tried the medications. She says that for a long time she is able to sleep about 12 hours a day and even though she sleeps a lot she is not feeling refreshed or energetic.  Patient is states she is is still interested in residential treatment for substance abuse.  Per nursing: Kristen Schneider's mood and affect are bright, congruent, appropriate, improved, happy and observed in the day room playing cards with peers; was able to come out and talked with me: "I am not mad at my boyfriend anymore, I got over it .Marland Kitchen..." Denied pain, denied SI/HI/AVH. Will continue with POC.  Principal Problem: MDD (major depressive disorder) (HCC) Diagnosis:   Patient Active Problem List   Diagnosis Date Noted  . Alcohol use disorder, severe, dependence (HCC) [F10.20] 04/23/2015  . Cannabis use disorder, moderate, dependence (HCC) [F12.20] 04/23/2015  . Cocaine use disorder, moderate, dependence (HCC) [F14.20] 04/23/2015  . Opioid use disorder, moderate, dependence (HCC) [F11.20] 04/23/2015  . Amphetamine abuse [F15.10] 04/23/2015  . 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 MDD (major depressive disorder) (HCC) [F32.9] 04/22/2015  . Tobacco use disorder [F17.200] 05/30/2014   Total Time spent with patient: 30 minutes  Past Medical History:  Past Medical History  Diagnosis Date  . Torn rotator cuff left shoulder  . Shingles 2006  . Polysubstance abuse   . Abnormal menstrual periods   . Anxiety     Past  Surgical History  Procedure Laterality Date  . Tonsillectomy      25 y/o   . Wisdom tooth extraction      25 y/o   . Polypectomy      As a child multiple colonoscopy's for polyp removal  . Appendectomy    . Laparoscopic appendectomy N/A 05/23/2014    Procedure: APPENDECTOMY LAPAROSCOPIC;  Surgeon: Harriette Bouillonhomas Cornett, MD;  [facility];  Service: General;  Laterality: N/A;  . Laparoscopic lysis of adhesions N/A 05/23/2014    Procedure: LAPAROSCOPIC LYSIS OF ADHESIONS;  Surgeon: Harriette Bouillonhomas Cornett, MD;  Location: [facility] OR;  Service: General;  Laterality: N/A;   Family History:  Family History  Problem Relation Age of Onset  . Alcoholism Father    Social History:  History  Alcohol Use No     History  Drug Use No    Comment: 05/23/14 - reports no use for last 4 months    Social History   Social History  . Marital Status: Single    Spouse Name: N/A  . Number of Children: N/A  . Years of Education: N/A   Social History Main Topics  . Smoking status: Current Every Day Smoker -- 1.00 packs/day    Types: Cigarettes  . Smokeless tobacco: Former NeurosurgeonUser  . Alcohol Use: No  . Drug Use: No     Comment: 05/23/14 - reports no use for last 4 months  . Sexual Activity: Yes    Birth Control/ Protection: Pill   Other Topics Concern  . None   Social History Narrative    Current Medications: Current Facility-Administered  Medications  Medication Dose Route Frequency Provider Last Rate Last Dose  . acetaminophen (TYLENOL) tablet 650 mg  650 mg Oral Q6H PRN Jimmy Footman, MD      . alum & mag hydroxide-simeth (MAALOX/MYLANTA) 200-200-20 MG/5ML suspension 30 mL  30 mL Oral Q4H PRN Jimmy Footman, MD      . cefUROXime (CEFTIN) tablet 500 mg  500 mg Oral BID WC Jimmy Footman, MD   500 mg at 04/24/15 0834  . gabapentin (NEURONTIN) capsule 400 mg  400 mg Oral TID Jimmy Footman, MD   400 mg at 04/24/15 0834  . hydrOXYzine (ATARAX/VISTARIL) tablet 50 mg  50  mg Oral TID PRN Jimmy Footman, MD   50 mg at 04/24/15 0831  . magnesium hydroxide (MILK OF MAGNESIA) suspension 30 mL  30 mL Oral Daily PRN Jimmy Footman, MD      . naltrexone (DEPADE) tablet 25 mg  25 mg Oral Daily Jimmy Footman, MD   25 mg at 04/24/15 0831  . nicotine (NICODERM CQ - dosed in mg/24 hours) patch 21 mg  21 mg Transdermal Daily Jimmy Footman, MD   21 mg at 04/23/15 1400  . sertraline (ZOLOFT) tablet 25 mg  25 mg Oral Daily Jimmy Footman, MD   25 mg at 04/24/15 1610    Lab Results:  Results for orders placed or performed during the hospital encounter of 04/22/15 (from the past 48 hour(s))  hCG, quantitative, pregnancy     Status: None   Collection Time: 04/23/15  7:06 PM  Result Value Ref Range   hCG, Beta Chain, Quant, S 1 <5 mIU/mL    Comment:          GEST. AGE      CONC.  (mIU/mL)   <=1 WEEK        5 - 50     2 WEEKS       50 - 500     3 WEEKS       100 - 10,000     4 WEEKS     1,000 - 30,000     5 WEEKS     3,500 - 115,000   6-8 WEEKS     12,000 - 270,000    12 WEEKS     15,000 - 220,000        FEMALE AND NON-PREGNANT FEMALE:     LESS THAN 5 mIU/mL   TSH     Status: None   Collection Time: 04/23/15  7:06 PM  Result Value Ref Range   TSH 0.872 0.350 - 4.500 uIU/mL    Blood Alcohol level:  Lab Results  Component Value Date   ETH 24* 04/16/2015   ETH <5 06/23/2014    Musculoskeletal: Strength & Muscle Tone: within normal limits Gait & Station: normal Patient leans: N/A  Psychiatric Specialty Exam: Review of Systems  Constitutional: Negative.   HENT: Negative.   Eyes: Negative.   Respiratory: Negative.   Cardiovascular: Negative.   Gastrointestinal: Negative.   Genitourinary: Negative.   Musculoskeletal: Negative.   Skin: Negative.   Neurological: Negative.   Endo/Heme/Allergies: Negative.   Psychiatric/Behavioral: Positive for depression and substance abuse.    Blood pressure 107/66,  pulse 60, temperature 97.9 F (36.6 C), temperature source Oral, resp. rate 18, height  (1.651 m), weight 67.586 kg (149 lb), SpO2 97 %.Body mass index is 24.79 kg/(m^2).  General Appearance: Fairly Groomed  Patent attorney::  Good  Speech:  Clear and Coherent  Volume:  Normal  Mood:  Euthymic  Affect:  Appropriate  Thought Process:  Linear  Orientation:  Full (Time, Place, and Person)  Thought Content:  Hallucinations: None  Suicidal Thoughts:  No  Homicidal Thoughts:  No  Memory:  Immediate;   Good Recent;   Good Remote;   Good  Judgement:  Fair  Insight:  Fair  Psychomotor Activity:  EPS  Concentration:  Good  Recall:  Good  Fund of Knowledge:Good  Language: Good  Akathisia:  No  Handed:    AIMS (if indicated):     Assets:  Manufacturing systems engineer Physical Health Social Support  ADL's:  Intact  Cognition: WNL  Sleep:  Number of Hours: 6   Treatment Plan Summary:  Major depressive disorder the patient has been started on sertraline 25 mg a day  Alcohol use disorder patient will be started on naltrexone 25 mg a day  Anxiety I will start the patient on Neurontin but will decrease to 300 mg po bid due to sedation. This medication is also helpful on patient did abuse alcohol as he tends to decrease cravings. I will also order Vistaril  3 times a day as needed but will reduce to 25 mg   Insomnia: Will continue Vistaril daily at bedtime when necessary  Tobacco use disorder the patient will be started on nicotine patch 21 mg a day  Addiction issues, alcohol, opiates, cocaine, benzodiazepines, amphetamines: Patient is willing to go to rehabilitation upon discharge. She is open to the idea of returning to [facility] or going to [facility].  UTI: continue ceftin for 1 more days  Labs: TSH wnl, B12 pending  and pregnancy neg  Precautions every 15 minute checks  Diet regular  Hospitalization and status pt is voluntary  Disposition: Will attempt to place the patient in a  residential facility for treatment of addiction--likely [facility] as she has been there before.  Follow-up: To be determined  Jimmy Footman, MD 04/24/2015, 11:27 AM

## 2015-04-24 NOTE — [facility] Suicide Risk Assessment (Signed)
[facility] INPATIENT:  Family/Significant Other Suicide Prevention Education  Suicide Prevention Education:  Education Completed; Kristen Schneider (grandmother) [phone] has been identified by the patient as the family member/significant other with whom the patient will be residing, and identified as the person(s) who will aid the patient in the event of a mental health crisis (suicidal ideations/suicide attempt).  With written consent from the patient, the family member/significant other has been provided the following suicide prevention education, prior to the and/or following the discharge of the patient.  The suicide prevention education provided includes the following:  Suicide risk factors  Suicide prevention and interventions  National Suicide Hotline telephone number  [facility] assessment telephone number  [facility] Emergency Assistance 911  [facility] and/or Residential Mobile Crisis Unit telephone number  Request made of family/significant other to:  Remove weapons (e.g., guns, rifles, knives), all items previously/currently identified as safety concern.    Remove drugs/medications (over-the-counter, prescriptions, illicit drugs), all items previously/currently identified as a safety concern.  The family member/significant other verbalizes understanding of the suicide prevention education information provided.  The family member/significant other agrees to remove the items of safety concern listed above.  [name], Zalyn Amend T, MSW, LCSWA 04/24/2015, 3:15 PM

## 2015-04-24 NOTE — Tx Team (Signed)
Interdisciplinary Treatment Plan Update (Adult)  Date:  04/24/2015 Time Reviewed:  11:19 AM  Progress in Treatment: Attending groups: No. Participating in groups:  No. Taking medication as prescribed:  Yes. Tolerating medication:  Yes. Family/Significant othe contact made:  No, will contact:  patient's grandmother Patient understands diagnosis:  Yes. Discussing patient identified problems/goals with staff:  Yes. Medical problems stabilized or resolved:  Yes. Denies suicidal/homicidal ideation: Yes. Issues/concerns per patient self-inventory:  Yes. Other:  New problem(s) identified: No, Describe:  none reported  Discharge Plan or Barriers: Patient will stabilize and discharge home with outpatient intensive substance abuse Tx scheduled unless patient is able to get into [facility]'s   Reason for Continuation of Hospitalization: Depression Medication stabilization Suicidal ideation Withdrawal symptoms  Comments:  Estimated length of stay: up to 4 days, expected discharge Monday 04/28/15  New goal(s):  Review of initial/current patient goals per problem list:   1.  Goal(s): Participate in aftercare plan    Met:  Yes  Target date: by discharge  As evidenced by: patient will participate in aftercare plan AEB aftercare provider and housing plan identified at discharge 04/24/15: Patient has identified outpatient provider and can return home at discharge if there is no bed at [facility] for substance abuse residential Tx. Goal met.    2.  Goal (s): Decrease depression   Met:  No  Target date: by discharge  As evidenced by: patient demonstrates decreased symptoms of depression and reports a Depression rating of 3 or less 04/24/15: Patient is isolating and in bed not participating in milieu but is compliant with meds.   3.  Goal(s): Patient will demonstrate decreased signs of withdrawal due to substance abuse    Met:  No  Target date: by discharge  As evidenced by:  Patient will produce a CIWA/COWS score of 0, have stable vitals signs, and no symptoms of withdrawal 04/24/15: Patient will continue to be monitored for symptoms of withdrawal.     Attendees: Physician:  Merlyn Albert, MD 3/30/201711:19 AM  Nursing:   Polly Cobia, RN 3/30/201711:19 AM  Other:  Carmell Austria LCSWA 3/30/201711:19 AM  Other:   3/30/201711:19 AM  Other:   3/30/201711:19 AM  Other:  3/30/201711:19 AM  Other:  3/30/201711:19 AM  Other:  3/30/201711:19 AM  Other:  3/30/201711:19 AM  Other:  3/30/201711:19 AM  Other:  3/30/201711:19 AM  Other:   3/30/201711:19 AM   Scribe for Treatment Team:   Keene Breath, MSW, [place]  04/24/2015, 11:19 AM  [phone]

## 2015-04-24 NOTE — [facility] Group Notes (Signed)
[facility] Group Notes:  (Nursing/MHT/Case Management/Adjunct)  Date:  04/24/2015  Time:  2:39 PM  Type of Therapy:  Psychoeducational Skills  Participation Level:  Active  Participation Quality:  Appropriate, Attentive, Sharing and Supportive  Affect:  Appropriate  Cognitive:  Appropriate  Insight:  Appropriate  Engagement in Group:  Supportive  Modes of Intervention:  Discussion and Education  Summary of Progress/Problems:  Kristen Schneider 04/24/2015, 2:39 PM

## 2015-04-24 NOTE — Plan of Care (Signed)
Problem: [facility] Participation in Recreation Therapeutic Interventions Goal: STG-Patient will demonstrate improved self esteem by identif STG: Self-Esteem - Within 4 treatment sessions, patient will verbalize at least 5 positive affirmation statements in each of 2 treatment sessions to increase self-esteem post d/c.  Outcome: Progressing Treatment Session 1; Completed 1 out of 2: At approximately 10:30 am, LRT met with patient in patient room. Patient verbalized 5 positive affirmation statements. Patient reported it felt "pretty good". LRT encouraged patient to continue saying positive affirmation statements. Intervention Used: I Am statements  Leonette Monarch, LRT/CTRS 03.30.17 1:59 pm Goal: STG-Patient will identify at least five coping skills for ** STG: Coping Skills - Within 4 treatment sessions, patient will verbalize at least 5 coping skills for anger in each of 2 treatment sessions to increase anger management skills post d/c.  Outcome: Progressing Treatment Session 1; Completed 1 out of 2: At approximately 10:30 am, LRT met with patient in patient room. Patient verbalized 5 coping skills for anger. Patient verbalized what triggers her to get angry, how her body responds to anger, and how she is going to remember to use her healthy coping skills. Intervention Used: Coping Skills worksheet  Leonette Monarch, LRT/CTRS 03.30.17 2:01 pm Goal: STG-Other Recreation Therapy Goal (Specify) STG: Stress Management - Within 4 treatment sessions, patient will verbalize understanding of the stress management techniques in each of 2 treatment sessions to increase stress management skills post d/c.  Outcome: Progressing Treatment Session 1; Completed 1 out of 2: At approximately 10:30 am, LRT met with patient in patient room. LRT educated and provided patient with handouts on stress management techniques. Patient verbalized understanding. LRT encouraged patient to read over and practice the stress  management techniques. Intervention Used: Stress Management handouts  Leonette Monarch, LRT/CTRS 03.30.17 2:03 pm

## 2015-04-25 DIAGNOSIS — E538 Deficiency of other specified B group vitamins: Secondary | ICD-10-CM

## 2015-04-25 MED ORDER — SERTRALINE HCL 25 MG PO TABS: 25.0000 mg | ORAL_TABLET | Freq: Every day | ORAL | Status: DC

## 2015-04-25 MED ORDER — NALTREXONE HCL 50 MG PO TABS
25.0000 mg | ORAL_TABLET | Freq: Every day | ORAL | Status: DC
Start: 2015-04-25 — End: 2015-04-28

## 2015-04-25 MED ORDER — CYANOCOBALAMIN 1000 MCG/ML IJ SOLN
1000.0000 ug | Freq: Every day | INTRAMUSCULAR | Status: DC
  Administered 2015-04-25 – 2015-04-28 (×4): 1000 ug via SUBCUTANEOUS
  Filled 2015-04-25 (×4): qty 1

## 2015-04-25 MED ORDER — GABAPENTIN 300 MG PO CAPS: 300.0000 mg | ORAL_CAPSULE | Freq: Two times a day (BID) | ORAL | Status: DC

## 2015-04-25 MED ORDER — HYDROXYZINE HCL 25 MG PO TABS: 25.0000 mg | ORAL_TABLET | Freq: Three times a day (TID) | ORAL | Status: AC | PRN

## 2015-04-25 NOTE — Progress Notes (Signed)
[facility] MD Progress Note  04/25/2015 12:14 PM Kristen Schneider  MRN:  [id number] Subjective:  Patient reports doing "okay" this morning. She denies suicidality, homicidality or having auditory or visual hallucinations. She denies having any side effects from the new medications started. She denies having any physical complaints. Patient denies having major problems with his sleep, appetite or concentration. She continues to report that her energy is low. Patient is demanding to be discharged today. She is voluntary and has requested to sign a 72 hour discharged form. She is not longer and interested in going to inpatient residential substance abuse treatment.    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 Stated concentration is good . Stated on Depression scale 3 , hopeless 1 and anxiety 9 .( low 0-10 high) Denies suicidal homicidal ideations . No auditory hallucinations No pain concerns . Appropriate ADL'S. Interacting with peers and staff.   Principal Problem: MDD (major depressive disorder) (HCC) Diagnosis:   Patient Active Problem List   Diagnosis Date Noted  . Alcohol use disorder, severe, dependence (HCC) [F10.20] 04/23/2015  . Cannabis use disorder, moderate, dependence (HCC) [F12.20] 04/23/2015  . Cocaine use disorder, moderate, dependence (HCC) [F14.20] 04/23/2015  . Opioid use disorder, moderate, dependence (HCC) [F11.20] 04/23/2015  . Amphetamine abuse [F15.10] 04/23/2015  . 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 MDD (major depressive disorder) (HCC) [F32.9] 04/22/2015  . Tobacco use disorder [F17.200] 05/30/2014   Total Time spent with patient: 30 minutes  Past Medical History:  Past Medical History  Diagnosis Date  . Torn rotator cuff left shoulder  . Shingles 2006  . Polysubstance  abuse   . Abnormal menstrual periods   . Anxiety     Past Surgical History  Procedure Laterality Date  . Tonsillectomy      25 y/o   . Wisdom tooth extraction      25 y/o   . Polypectomy      As a child multiple colonoscopy's for polyp removal  . Appendectomy    . Laparoscopic appendectomy N/A 05/23/2014    Procedure: APPENDECTOMY LAPAROSCOPIC;  Surgeon: Harriette Bouillon, MD;  Location: [facility] OR;  Service: General;  Laterality: N/A;  . Laparoscopic lysis of adhesions N/A 05/23/2014    Procedure: LAPAROSCOPIC LYSIS OF ADHESIONS;  Surgeon: Harriette Bouillon, MD;  Location: [facility] OR;  Service: General;  Laterality: N/A;   Family History:  Family History  Problem Relation Age of Onset  . Alcoholism Father    Social History:  History  Alcohol Use No     History  Drug Use No    Comment: 05/23/14 - reports no use for last 4 months    Social History   Social History  . Marital Status: Single    Spouse Name: N/A  . Number of Children: N/A  . Years of Education: N/A   Social History Main Topics  . Smoking status: Current Every Day Smoker -- 1.00 packs/day    Types: Cigarettes  . Smokeless tobacco: Former Neurosurgeon  . Alcohol Use: No  . Drug Use: No     Comment: 05/23/14 - reports no use for last 4 months  . Sexual Activity: Yes    Birth Control/ Protection: Pill   Other Topics Concern  . None   Social History Narrative    Current Medications: Current Facility-Administered Medications  Medication Dose Route Frequency Provider Last Rate Last Dose  . acetaminophen (TYLENOL) tablet 650 mg  650 mg Oral Q6H PRN Jimmy FootmanAndrea Hernandez-Gonzalez, MD      . alum & mag hydroxide-simeth (MAALOX/MYLANTA) 200-200-20 MG/5ML suspension 30 mL  30 mL Oral Q4H PRN Jimmy FootmanAndrea Hernandez-Gonzalez, MD      . gabapentin (NEURONTIN) capsule 300 mg  300 mg Oral BID Jimmy FootmanAndrea Hernandez-Gonzalez, MD   300 mg at 04/25/15 [id number]  . hydrOXYzine (ATARAX/VISTARIL) tablet 25 mg  25 mg Oral TID PRN Jimmy FootmanAndrea Hernandez-Gonzalez, MD   25  mg at 04/24/15 2100  . magnesium hydroxide (MILK OF MAGNESIA) suspension 30 mL  30 mL Oral Daily PRN Jimmy FootmanAndrea Hernandez-Gonzalez, MD      . naltrexone (DEPADE) tablet 25 mg  25 mg Oral Daily Jimmy FootmanAndrea Hernandez-Gonzalez, MD   25 mg at 04/25/15 0907  . nicotine (NICODERM CQ - dosed in mg/24 hours) patch 21 mg  21 mg Transdermal Daily Jimmy FootmanAndrea Hernandez-Gonzalez, MD   21 mg at 04/23/15 1400  . sertraline (ZOLOFT) tablet 25 mg  25 mg Oral Daily Jimmy FootmanAndrea Hernandez-Gonzalez, MD   25 mg at 04/25/15 11910906    Lab Results:  Results for orders placed or performed during the hospital encounter of 04/22/15 (from the past 48 hour(s))  hCG, quantitative, pregnancy     Status: None   Collection Time: 04/23/15  7:06 PM  Result Value Ref Range   hCG, Beta Chain, Quant, S 1 <5 mIU/mL    Comment:          GEST. AGE      CONC.  (mIU/mL)   <=1 WEEK        5 - 50     2 WEEKS       50 - 500     3 WEEKS       100 - 10,000     4 WEEKS     1,000 - 30,000     5 WEEKS     3,500 - 115,000   6-8 WEEKS     12,000 - 270,000    12 WEEKS     15,000 - 220,000        FEMALE AND NON-PREGNANT FEMALE:     LESS THAN 5 mIU/mL   TSH     Status: None   Collection Time: 04/23/15  7:06 PM  Result Value Ref Range   TSH 0.872 0.350 - 4.500 uIU/mL  Vitamin B12     Status: None   Collection Time: 04/23/15  7:06 PM  Result Value Ref Range   Vitamin B-12 226 180 - 914 pg/mL    Comment: (NOTE) This assay is not validated for testing neonatal or myeloproliferative syndrome specimens for Vitamin B12 levels. [facility]     Blood Alcohol level:  Lab Results  Component Value Date   [facility]   ETH <5 06/23/2014    Musculoskeletal: Strength & Muscle Tone: within normal limits Gait & Station: normal Patient leans: N/A  Psychiatric Specialty Exam: Review of Systems  Constitutional: Negative.   HENT: Negative.   Eyes: Negative.   Respiratory: Negative.   Cardiovascular: Negative.    Gastrointestinal: Negative.   Genitourinary: Negative.   Musculoskeletal: Negative.   Skin: Negative.   Neurological: Negative.   Endo/Heme/Allergies: Negative.   Psychiatric/Behavioral: Positive for depression and substance abuse.    Blood pressure 107/66, pulse 60, temperature 97.9 F (36.6 C), temperature source Oral, resp. rate 18, height 5\' 5"  (1.651 m), weight  67.586 kg (149 lb), SpO2 97 %.Body mass index is 24.79 kg/(m^2).  General Appearance: Fairly Groomed  Patent attorney::  Good  Speech:  Clear and Coherent  Volume:  Normal  Mood:  Euthymic  Affect:  Appropriate  Thought Process:  Linear  Orientation:  Full (Time, Place, and Person)  Thought Content:  Hallucinations: None  Suicidal Thoughts:  No  Homicidal Thoughts:  No  Memory:  Immediate;   Good Recent;   Good Remote;   Good  Judgement:  Fair  Insight:  Fair  Psychomotor Activity:  EPS  Concentration:  Good  Recall:  Good  Fund of Knowledge:Good  Language: Good  Akathisia:  No  Handed:    AIMS (if indicated):     Assets:  Manufacturing systems engineer Physical Health Social Support  ADL's:  Intact  Cognition: WNL  Sleep:  Number of Hours: 6.45   Treatment Plan Summary:  Major depressive disorder the patient has been started on sertraline 25 mg a day  Alcohol use disorder : Continue  naltrexone 25 mg a day  Anxiety: Continue Neurontin 300 mg by mouth twice a day. Vistaril 25 mg 3 times a day as needed mg   Insomnia: Will continue Vistaril daily at bedtime when necessary  Tobacco use disorder the patient will be started on nicotine patch 21 mg a day  Addiction issues, alcohol, opiates, cocaine, benzodiazepines, amphetamines: Patient is willing to go to rehabilitation upon discharge. She is open to the idea of returning to [facility] or going to [facility].  UTI: We will complete treatment today  Low vitamin B12: We'll replace vitamin B12 with injections  Labs: TSH wnl,   and pregnancy neg.  vitamin B12 level  is low  Precautions every 15 minute checks  Diet regular  Hospitalization and status pt is voluntary-----wanting to signed a 72 hour discharged form which will be up on Monday  Disposition: Patient says she is not longer interested in inpatient substance abuse treatment. She states she wants to be discharged on Monday and will follow-up with [facility] intensive outpatient substance abuse program.    Patient has requested to sign a 72 hour discharged form  Follow-up: [facility] intensive outpatient substance abuse program  Jimmy Footman, MD 04/25/2015, 12:14 PM

## 2015-04-25 NOTE — Progress Notes (Signed)
Recreation Therapy Notes  Date: 03.31.17 Time: 3:00 pm Location: Craft Room  Group Topic: Communication, Problem Solving, Teamwork  Goal Area(s) Addresses:  Patient will effectively work with peer towards shared goal. Patient will identify skills used to make activity successful. Patient will identify benefit of using group skills effectively post d/c.  Behavioral Response: Attentive, Interactive, Left early  Intervention: [employer]er Tower  Activity: Patients were given 15 pipe cleaners and were instructed to build a free standing tower out of all 15 pipe cleaners. After approximately 3-5 minutes, patients were instructed to put their dominant hand behind their back. After another 3-5 minutes, patients were instructed to stop talking to their group.  Education: LRT educated patients on healthy support systems.  Education Outcome: Acknowledges education/In group clarification offered  Clinical Observations/Feedback: Patient worked with peers to build tower. Patient used effective communication, problem solving, and teamwork skills. Patient contributed to group discussion by stating what skills her team used, why communication, problem solving, and teamwork are important, how she felt after using the skills in group, and what would change for her if she used these skills effectively. Patient left group at approximately 3:50 pm after peer started talking about his mother dying.  Kristen Schneider,Kristen Schneider, LRT/CTRS 04/25/2015 4:32 PM
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
## 2015-04-25 NOTE — Progress Notes (Signed)
Kristen BumpsJessica continues to improve daily and functioning at baseline, thoughts are organized and socializing well with peers and staffs; denied SI/HI, no AV/H, medication compliant.

## 2015-04-25 NOTE — Plan of Care (Signed)
Problem: Alteration in mood; excessive anxiety as evidenced by: Goal: LTG-Patient's behavior demonstrates decreased anxiety (Patient's behavior demonstrates anxiety and he/she is utilizing learned coping skills to deal with anxiety-producing situations)  Outcome: Not Progressing Patient was irritable, angry, loud, cursed at staff and instigated other peers. Medication given to target anxiety.

## 2015-04-25 NOTE — [facility] Group Notes (Signed)
[facility] LCSW Group Therapy   04/25/2015 1:15 PM   Type of Therapy: Group Therapy   Participation Level: Active   Participation Quality: Attentive, Sharing and Supportive   Affect: Depressed and Flat   Cognitive: Alert and Oriented   Insight: Developing/Improving and Engaged   Engagement in Therapy: Developing/Improving and Engaged   Modes of Intervention: Clarification, Confrontation, Discussion, Education, Exploration, Limit-setting, Orientation, Problem-solving, Rapport Building, Dance movement psychotherapisteality Testing, Socialization and Support   Summary of Progress/Problems: The topic for today was feelings about relapse. Pt discussed what relapse prevention is to them and identified triggers that they are on the path to relapse. Pt processed their feeling towards relapse and was able to relate to peers. Pt discussed coping skills that can be used for relapse prevention. Pt shared she was depressed that she was not feeling better after being admitted but that she understood that the reason for this was the fact that she wasn't trying very hard to recover.  Pt shared she was lonely at times but enjoyed groups and the dayroom comraderie.  Pt shared she believes things will turn out good for her and she will not relaps as long as she does not isolate  Pt reported that for the pt loneliness is a trigger.   CSW actively validated the pt's opinion and provided feedback.  Pt was polite and cooperative with the CSW and other group members and focused and attentive to the topics discussed and the sharing of others.    Dorothe PeaJonathan F. Hearl Heikes, MSW, LCSWA, LCAS

## 2015-04-25 NOTE — Plan of Care (Signed)
Problem: [facility] Participation in Recreation Therapeutic Interventions Goal: STG-Patient will demonstrate improved self esteem by identif STG: Self-Esteem - Within 4 treatment sessions, patient will verbalize at least 5 positive affirmation statements in each of 2 treatment sessions to increase self-esteem post d/c.  Outcome: Completed/Met Date Met:  04/25/15 Treatment Session 2; Completed 2 out of 2: At approximately 11:30 am, LRT met with patient in patient room. Patient verbalized 5 positive affirmation statements. Patient reported it felt "pretty good". LRT encouraged patient to continue saying positive affirmation statements. Intervention Used: I Am statements  Leonette [facility], LRT/CTRS 03.31.17 2:31 pm Goal: STG-Patient will identify at least five coping skills for ** STG: Coping Skills - Within 4 treatment sessions, patient will verbalize at least 5 coping skills for anger in each of 2 treatment sessions to increase anger management skills post d/c.  Outcome: Completed/Met Date Met:  04/25/15 Treatment Session 2; Completed 2 out of 2: At approximately 11:30 am, LRT met with patient in patient room. Patient verbalized 5 coping skills for anger. LRT encouraged patient to use her healthy coping skills when she felt herself getting angry to help calm herself down. Intervention Used: Coping Skills worksheet  Leonette [facility], LRT/CTRS 03.31.17 2:32 pm Goal: STG-Other Recreation Therapy Goal (Specify) STG: Stress Management - Within 4 treatment sessions, patient will verbalize understanding of the stress management techniques in each of 2 treatment sessions to increase stress management skills post d/c.  Outcome: Completed/Met Date Met:  04/25/15 Treatment Session 2; Completed 2 out of 2: At approximately 11:30 am, LRT met with patient in patient room. Patient reported she read over the stress management techniques. Patient verbalized understanding. LRT encouraged patient to practice the stress  management techniques. Intervention Used: Stress Management handouts  Leonette [facility], LRT/CTRS 03.31.17 2:34 pm

## 2015-04-25 NOTE — Progress Notes (Addendum)
Patient nagging, needy, demanding this evening. She complained that she did not get the right tray, but when the staff checked the menu that she had filled out, it marched the tray. Staff explained that the tray was correct per her menu. She banged the table with her hand saying, "thats not my tray, am not stupid, damn bitch." Patient attempting to split staff by asking different staff for the same thing. Patient also threatened to "fuck the place up" if she didn't get whatever she wanted. Nurse supervisor was called to talk to the patient. Patient was more receptive to her. She continues to be irritable, loud with rapid pressured speech.

## 2015-04-25 NOTE — [facility] Group Notes (Deleted)
[facility] LCSW Group Therapy   04/25/2015 11am  Type of Therapy: Group Therapy   Participation Level: Did Not Attend. Patient invited to participate but declined.    Shaiann Mcmanamon F. Ryley Teater, MSW, LCSWA, LCAS   

## 2015-04-25 NOTE — [facility] Group Notes (Signed)
[facility] LCSW Aftercare Discharge Planning Group Note  04/25/2015 9:30 AM  Participation Quality: Did Not Attend. Patient invited to participate but declined.   Anglea Gordner F. Raiyah Speakman, MSW, LCSWA, LCAS   

## 2015-04-25 NOTE — Progress Notes (Signed)
Patient noted to be anxious, seen speaking angrily on the phone, needy, irritable, demanding and also instigated other peers. When staff redirected her, she walked off yelling and cursing loudly to her room. She banged her bedroom door shut and stayed in her room for a while. Vistaril 25mg  PO PRN administered at 1258 with slight effectiveness verbalized by patient. Denied SI, AH and VH. Contracted for safety.

## 2015-04-26 MED ORDER — SERTRALINE HCL 50 MG PO TABS
50.0000 mg | ORAL_TABLET | Freq: Every day | ORAL | Status: DC
  Administered 2015-04-26: 50 mg via ORAL
  Filled 2015-04-26 (×4): qty 1

## 2015-04-26 NOTE — [facility] Group Notes (Signed)
[facility] Group Notes:  (Nursing/MHT/Case Management/Adjunct)  Date:  04/26/2015  Time:  9:18 AM  Type of Therapy:  Community   Participation Level:  Active  Participation Quality:  Attentive  Affect:  Defensive and Excited  Cognitive:  Alert  Insight:  Limited  Engagement in Group:  Engaged  Modes of Intervention:  Discussion and Education  Summary of Progress/Problems:  Kristen Schneider 04/26/2015, 9:18 AM

## 2015-04-26 NOTE — Progress Notes (Signed)
[facility] MD Progress Note  04/26/2015 12:20 PM Kristen Schneider  MRN:  [id number] Subjective:  The patient reports that she slept fairly well last night and affect is calm this morning but she did have an anger outburst yesterday at dinner. The patient did not get what she ordered on the menu and became threatening to staff stating that she wanted to "fuck the place up". The patient minimizes the event but does admit to problems with anger. She has been attending groups regularly and says she is trying to work on coping skills to help deal with anger. She denies any current severe depressive symptoms and says her mood has improved slowly. She denies any current active or passive suicidal thoughts or psychotic symptoms. The patient says she's has a lot of anxiety and flashbacks related to her mother's death. She feels like underlying triggers for anxiety are completely related to her mother's death. Times spent in grief counseling discussing stages of grief. The patient denies any psychotic symptoms currently. Appetite is good and she denies any problems with insomnia. She says far, she is tolerating Zoloft well and is inquiring about increasing the dosage. She feels like her anxiety is still elevated. No current withdrawal symptoms. The patient is open to going to intensive outpatient substance abuse treatment at [facility] after discharge but is supposed any residential substance abuse treatment. She says her fianc has been visiting her on a regular basis.  Supportive psychotherapy provided and Times and discussing anger management and ways to decrease negative thoughts associated with anxiety. She was encouraged to pursue individual therapy after discharge as well to help process prior trauma of her mother's death.     Past Psychiatric History:   Patient says she is seeing a provider at [facility] in [place]. She has been going there for about a year. Prior to admission she was prescribed with Prozac,  olanzapine and benztropine. She reports being on those medications for about 3-4 months. She feels that the Prozac was making her more agitated and more irritable.  Patient reports this was her second suicidal attempt. Her first suicidal attempt was last year when she overdosed on Seroquel. She reports a history of self injury by cutting, says her last cutting episode occurred last year.  Patient completed rehabilitation at [facility] in 2013. She stated that for about a month and a half. She says she was sober for about a year after she left.  Patient believes she's been diagnosed with bipolar disorder and anxiety. She says she received this diagnosis around the age of 71.   Admitted at [facility] Jan 2016 :came in to get help with her substance abuse.  She said she has been abusing various substances since she was 25 yrs old.  At that time she was using heroin, crack and cocaine.  She also was drinking vodka daily.  She was discharged on gabapentin 300 mg by mouth 3 times a day for pain, Lidoderm patches for pain, naltrexone 25 mg for alcoholism and opiate dependence, paroxetine 10 mg for depression, quetiapine 25 mg twice a day as needed for anxiety and trazodone 50 mg as needed for insomnia. The patient was  to Follow up with [facility].  Her discharge diagnoses were alcohol dependence and substance-induced mood disorder  Admitted to [facility] June 2016 Seroquel overdose: Overdose with Seroquel, claims she just wanted to sleep as she was tired and had no intention of committing suicide.. On admission was very drowsy. Psychiatry was subsequently consulted,  who cleared the patient for discharge when medically ready. Patient left AGAINST MEDICAL ADVICE.  DATE OF ADMISSION [facility]:  10/2011 diagnosed with polysubstance dependence and bipolar disorder. She was discharged on Tegretol and Paxil. Patient was referred to [facility] for admission.  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 She has been living with her fianc of 1 year and his mother at their house. Patient is currently separated of her husband. She was married around the age of 25 she has been separated for the last 2 years. She does not have any children.  As far as her education she completed high school. As far as legal history patient reports a long history of legal charges related to alcohol consumption such as public intoxication and assaults. She currently has legal charges pending for public intoxication and assault and has a court date pending in April before and April 26.  Family History: Patient's sister is addicted to heroine. She denies any history of other mental illness or suicidal attempts in her family   Principal Problem: MDD (major depressive disorder) (HCC) Diagnosis:   Patient Active Problem List   Diagnosis Date Noted  . Vitamin B12 deficiency [E53.8] 04/25/2015  . Alcohol use disorder, severe, dependence (HCC) [F10.20] 04/23/2015  . Cannabis use disorder, moderate, dependence (HCC) [F12.20] 04/23/2015  . Cocaine use disorder, moderate, dependence (HCC) [F14.20] 04/23/2015  . Opioid use disorder, moderate, dependence (HCC) [F11.20] 04/23/2015  . Amphetamine abuse [F15.10] 04/23/2015  . 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 MDD (major depressive disorder) (HCC) [F32.9] 04/22/2015  . Tobacco use disorder [F17.200] 05/30/2014   Total Time spent with patient: 30 minutes  Past Medical History:  Past Medical History  Diagnosis Date  . Torn rotator cuff left shoulder  . Shingles 2006  . Polysubstance abuse   . Abnormal menstrual periods   . Anxiety     Past Surgical History  Procedure Laterality Date  . Tonsillectomy      25 y/o   . Wisdom tooth extraction      25 y/o   . Polypectomy      As a child multiple  colonoscopy's for polyp removal  . Appendectomy    . Laparoscopic appendectomy N/A 05/23/2014    Procedure: APPENDECTOMY LAPAROSCOPIC;  Surgeon: Harriette Bouillonhomas Cornett, MD;  [facility];  Service: General;  Laterality: N/A;  . Laparoscopic lysis of adhesions N/A 05/23/2014    Procedure: LAPAROSCOPIC LYSIS OF ADHESIONS;  Surgeon: Harriette Bouillonhomas Cornett, MD;  Location: [facility] OR;  Service: General;  Laterality: N/A;   Family History:  Family History  Problem Relation Age of Onset  . Alcoholism Father    Social History:  History  Alcohol Use No     History  Drug Use No    Comment: 05/23/14 - reports no use for last 4 months    Social History   Social History  . Marital Status: Single    Spouse Name: N/A  . Number of Children: N/A  . Years of Education: N/A   Social History Main Topics  . Smoking status: Current Every Day Smoker -- 1.00 packs/day    Types: Cigarettes  . Smokeless tobacco: Former NeurosurgeonUser  . Alcohol Use: No  . Drug Use: No     Comment: 05/23/14 - reports no use for last 4 months  . Sexual Activity: Yes    Birth  Control/ Protection: Pill   Other Topics Concern  . None   Social History Narrative    Current Medications: Current Facility-Administered Medications  Medication Dose Route Frequency Provider Last Rate Last Dose  . acetaminophen (TYLENOL) tablet 650 mg  650 mg Oral Q6H PRN Jimmy Footman, MD      . alum & mag hydroxide-simeth (MAALOX/MYLANTA) 200-200-20 MG/5ML suspension 30 mL  30 mL Oral Q4H PRN Jimmy Footman, MD      . cyanocobalamin ((VITAMIN B-12)) injection 1,000 mcg  1,000 mcg Subcutaneous Daily Jimmy Footman, MD   1,000 mcg at 04/26/15 0926  . gabapentin (NEURONTIN) capsule 300 mg  300 mg Oral BID Jimmy Footman, MD   300 mg at 04/26/15 0926  . hydrOXYzine (ATARAX/VISTARIL) tablet 25 mg  25 mg Oral TID PRN Jimmy Footman, MD   25 mg at 04/26/15 1610  . magnesium hydroxide (MILK OF MAGNESIA) suspension 30 mL   30 mL Oral Daily PRN Jimmy Footman, MD      . naltrexone (DEPADE) tablet 25 mg  25 mg Oral Daily Jimmy Footman, MD   25 mg at 04/26/15 0925  . nicotine (NICODERM CQ - dosed in mg/24 hours) patch 21 mg  21 mg Transdermal Daily Jimmy Footman, MD   21 mg at 04/23/15 1400  . sertraline (ZOLOFT) tablet 50 mg  50 mg Oral Daily Darliss Ridgel, MD   50 mg at 04/26/15 9604    Lab Results:  No results found for this or any previous visit (from the past 48 hour(s)).  Blood Alcohol level:  Lab Results  Component Value Date   ETH 24* 04/16/2015   ETH <5 06/23/2014    Musculoskeletal: Strength & Muscle Tone: within normal limits Gait & Station: normal Patient leans: N/A  Psychiatric Specialty Exam: Review of Systems  Constitutional: Negative.  Negative for fever, chills, weight loss and malaise/fatigue.  HENT: Negative.  Negative for congestion, hearing loss, nosebleeds, sore throat and tinnitus.   Eyes: Negative.  Negative for blurred vision, double vision, photophobia and pain.  Respiratory: Negative.  Negative for cough, hemoptysis, shortness of breath and wheezing.   Cardiovascular: Negative.  Negative for chest pain, palpitations, leg swelling and PND.  Gastrointestinal: Negative.  Negative for heartburn, nausea, vomiting, abdominal pain, diarrhea and constipation.  Genitourinary: Negative.  Negative for dysuria, urgency, frequency and hematuria.  Musculoskeletal: Negative.  Negative for myalgias, back pain and neck pain.  Skin: Negative.  Negative for itching and rash.  Neurological: Negative.  Negative for dizziness, tingling, tremors and headaches.  Endo/Heme/Allergies: Negative.  Does not bruise/bleed easily.    Blood pressure 109/77, pulse 56, temperature 97.9 F (36.6 C), temperature source Oral, resp. rate 20, height  (1.651 m), weight 67.586 kg (149 lb), SpO2 97 %.Body mass index is 24.79 kg/(m^2).  General Appearance: Fairly Groomed  Proofreader::  Good  Speech:  Clear and Coherent  Volume:  Normal  Mood:  Euthymic  Affect:  Appropriate  Thought Process:  Linear  Orientation:  Full (Time, Place, and Person)  Thought Content:  Negative  Suicidal Thoughts:  No  Homicidal Thoughts:  No  Memory:  Immediate;   Good Recent;   Good Remote;   Good  Judgement:  Fair  Insight:  Fair  Psychomotor Activity:  EPS  Concentration:  Good  Recall:  Good  Fund of Knowledge:Good  Language: Good  Akathisia:  No  Handed:    AIMS (if indicated):     Assets:  Communication Skills Physical  Health Social Support  ADL's:  Intact  Cognition: WNL  Sleep:  Number of Hours: 6.75   Treatment Plan Summary:  Major depressive disorder: The patient was started on Zoloft and dose has been increased to  po daily  Alcohol use disorder : Continue  naltrexone 25 mg a day. No withdrawal symptoms. She does plan to pursue intensive outpatient treatment at [facility] after discharge and is ambivalent about  residential substance abuse treatment program at this time.  Anxiety: Continue Neurontin 300 mg by mouth twice a day. Vistaril 25 mg 3 times a day as needed mg   Insomnia: Will continue Vistaril daily at bedtime when necessary  Tobacco use disorder the patient will be started on nicotine patch 21 mg a day  Addiction issues, alcohol, opiates, cocaine, benzodiazepines, amphetamines: The patient is willing to go to outpatient intensive treatment program at [facility] but is supposed any residential substance abuse treatment at this time. We'll continue to encourage the patient to enter a meaningful recovery program at the time of discharge. She was advised to abstain from alcohol and all illicit drugs as they may worsen mood symptoms.   UTI: The patient was on a course of antibiotics for UTI. Currently no dysuria.   Low vitamin B12: We'll replace vitamin B12 with injections. The patient will be discharged on oral vitamin B-12 supplements.  Labs:  TSH wnl,   and pregnancy neg.  vitamin B12 level is low  Precautions every 15 minute checks  Diet regular  Hospitalization and status pt is voluntary-----wanting to signed a 72 hour discharged form which will be up on Monday  Disposition: Patient says she is not longer interested in inpatient substance abuse treatment. She states she wants to be discharged on Monday and will follow-up with [facility] intensive outpatient substance abuse program.    Patient has requested to sign a 72 hour discharged form  Follow-up: [facility] intensive outpatient substance abuse program  Levora Angel, MD 04/26/2015, 12:20 PM

## 2015-04-26 NOTE — Progress Notes (Signed)
D: Observed pt interacting with others in day room. Patient alert and oriented x4. Patient denies SI/HI/AVH. Pt affect is appropriate. Pt interacting well with others on the unit. No outbursts this shift. Pt talked about angry outburst earlier in day, and denied fault, but did acknowledge that she shouldn't have reacted the way she did. Pt mentioned that her goal is to "work on my anger." Pt rated anxiety 2/10 and depression 1/10. Pt had no complaints. A: Offered active listening and support. Provided therapeutic communication. Administered scheduled medications. Talked about ways to manage anger outbursts.  R: Pt pleasant and cooperative this shift. Pt medication compliant. Will continue Q15 min. checks. Safety maintained.

## 2015-04-26 NOTE — [facility] Group Notes (Signed)
[facility] LCSW Group Therapy  04/26/2015 3:18 PM  Type of Therapy:  Group Therapy  Participation Level:  Active  Participation Quality:  Attentive  Affect:  Appropriate  Cognitive:  Alert  Insight:  Improving  Engagement in Therapy:  Improving  Modes of Intervention:  Discussion, Education, Socialization and Support  Summary of Progress/Problems: Pt will identify unhealthy thoughts and how they impact their emotions and behavior. Pt will be encouraged to discuss these thoughts, emotions and behaviors with the group. Pt reports becoming angry and lashing out at people. She states she has negative self talk and believes she is not attractive.    Sem[employer] EnergyCandace L Rondo Spittler MSW, LCSWA  04/26/2015, 3:18 PM

## 2015-04-26 NOTE — Plan of Care (Signed)
Problem: Alteration in mood; excessive anxiety as evidenced by: Goal: STG-Pt will report an absence of self-harm thoughts/actions (Patient will report an absence of self-harm thoughts or actions)  Outcome: Progressing Patient denies self harm thoughts this shift

## 2015-04-26 NOTE — Progress Notes (Signed)
D:  Patient is alert and oriented on the unit this shift.  Patient attended and actively participated in groups today.  Patient denies suicidal ideation, homicidal ideation, auditory or visual hallucinations at the present time.  Patient apologized to this Clinical research associatewriter for her angry outburst over her dinner tray the previous night.  A:  Scheduled medications are administered to patient as per MD orders.  Emotional support and encouragement are provided.  Patient is maintained on q.15 minute safety checks.  Patient is informed to notify staff with questions or concerns. R:  No adverse medication reactions are noted.  Patient is cooperative with medication administration and treatment plan today.  Patient is receptive, calm and cooperative on the unit at this time.  Patient interacts well with others on the unit this shift.  Patient contracts for safety at this time.  Patient remains safe at this time.

## 2015-04-26 NOTE — Plan of Care (Signed)
Problem: Ineffective individual coping Goal: LTG: Patient will report a decrease in negative feelings Outcome: Progressing Pt is interacting well with others this shift. Pt indicated that her mood has improved since admission.

## 2015-04-26 NOTE — Plan of Care (Signed)
Problem: Alteration in mood; excessive anxiety as evidenced by: Goal: STG-Patient can identify triggers for anxiety Outcome: Progressing Patient was apologetic and insightful regarding her frustration the previous evening regarding her dinner tray mix up

## 2015-04-27 MED ORDER — SERTRALINE HCL 25 MG PO TABS
25.0000 mg | ORAL_TABLET | Freq: Every day | ORAL | Status: DC
  Administered 2015-04-28: 25 mg via ORAL
  Filled 2015-04-27: qty 1

## 2015-04-27 NOTE — Progress Notes (Signed)
Patient signed a 72 hour form on 04/25/15 at 2:15 pm. Copy of form placed on unit discharge board. House coordinator aware.

## 2015-04-27 NOTE — [facility] Group Notes (Signed)
[facility] Group Notes:  (Nursing/MHT/Case Management/Adjunct)  Date:  04/27/2015  Time:  2:58 AM  Type of Therapy:  Group Therapy  Participation Level:  Active  Participation Quality:  Appropriate  Affect:  Appropriate  Cognitive:  Appropriate  Insight:  Appropriate  Engagement in Group:  Engaged  Modes of Intervention:  n/a  Summary of Progress/Problems:  Kristen Schneider 04/27/2015, 2:58 AM

## 2015-04-27 NOTE — Plan of Care (Signed)
Problem: Alteration in mood; excessive anxiety as evidenced by: Goal: LTG-Patient's behavior demonstrates decreased anxiety (Patient's behavior demonstrates anxiety and he/she is utilizing learned coping skills to deal with anxiety-producing situations)  Outcome: Progressing Pt calm, pleasant, interacting well with peers.

## 2015-04-27 NOTE — Progress Notes (Signed)
[facility] MD Progress Note  04/27/2015 1:27 PM [name]  MRN:  [id number] Subjective:  The patient was very irritable and argumentative today. She is upset about the thought of even going to residential substance abuse treatment and insists on only going to an outpatient or gram at [facility]. The patient has little insight into the severity of her substance use. The patient says she needs to also be discharged stained because she has a court date Tuesday forestalled on government official and drunk and disorderly conduct. She says she has been to jail 3 times in the past. She denies any current active or passive suicidal thoughts or psychotic symptoms. She continues to report that her anxiety level remains high but is unable to identify triggers associated with her anxiety. No major panic attacks. She says she just feels "tense and wound up". She has been having some abnormal jerking movements but no movements were noticed by this Clinical research associate. The patient says these jerking movements are present even prior to admission prior to the overdose. She slept fairly well last night. Appetite is good and she has been interacting with staff and peers appropriately. The patient has been attending groups but was unable to verbalize what she has learned in groups.  Supportive psychotherapy provided and Times and discussing anger management and ways to decrease negative thoughts associated with anxiety. She was encouraged to pursue individual therapy after discharge as well to help process prior trauma of her mother's death.   Past Psychiatric History:   Patient says she is seeing a provider at [facility] in [place]. She has been going there for about a year. Prior to admission she was prescribed with Prozac, olanzapine and benztropine. She reports being on those medications for about 3-4 months. She feels that the Prozac was making her more agitated and more irritable.  Patient reports this was her second suicidal  attempt. Her first suicidal attempt was last year when she overdosed on Seroquel. She reports a history of self injury by cutting, says her last cutting episode occurred last year.  Patient completed rehabilitation at [facility] in 2013. She stated that for about a month and a half. She says she was sober for about a year after she left.  Patient believes she's been diagnosed with bipolar disorder and anxiety. She says she received this diagnosis around the age of 52.   Admitted at [facility] Jan 2016 :came in to get help with her substance abuse.  She said she has been abusing various substances since she was 25 yrs old.  At that time she was using heroin, crack and cocaine.  She also was drinking vodka daily.  She was discharged on gabapentin 300 mg by mouth 3 times a day for pain, Lidoderm patches for pain, naltrexone 25 mg for alcoholism and opiate dependence, paroxetine 10 mg for depression, quetiapine 25 mg twice a day as needed for anxiety and trazodone 50 mg as needed for insomnia. The patient was  to Follow up with [facility].  Her discharge diagnoses were alcohol dependence and substance-induced mood disorder  Admitted to [facility] Floor June 2016 Seroquel overdose: Overdose with Seroquel, claims she just wanted to sleep as she was tired and had no intention of committing suicide.. On admission was very drowsy. Psychiatry was subsequently consulted, who cleared the patient for discharge when medically ready. Patient left AGAINST MEDICAL ADVICE.  DATE OF ADMISSION [facility]:  10/2011 diagnosed with polysubstance dependence and bipolar disorder. She was discharged on Tegretol and  Paxil. Patient was referred to [facility] for admission.  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 She has been living with her fianc of 1 year and his mother at their house.  Patient is currently separated of her husband. She was married around the age of 47 she has been separated for the last 2 years. She does not have any children.  As far as her education she completed high [employer]. As far as legal history patient reports a long history of legal charges related to alcohol consumption such as public intoxication and assaults. She currently has legal charges pending for public intoxication and assault and has a court date pending in April before and April 26.  Family History: Patient's sister is addicted to heroine. She denies any history of other mental illness or suicidal attempts in her family  Legal History: The patient has had multiple charges in the past and has some pending charges. She states she has a court date on April 4th.   Principal Problem: MDD (major depressive disorder) (HCC) Diagnosis:   Patient Active Problem List   Diagnosis Date Noted  . Vitamin B12 deficiency [E53.8] 04/25/2015  . Alcohol use disorder, severe, dependence (HCC) [F10.20] 04/23/2015  . Cannabis use disorder, moderate, dependence (HCC) [F12.20] 04/23/2015  . Cocaine use disorder, moderate, dependence (HCC) [F14.20] 04/23/2015  . Opioid use disorder, moderate, dependence (HCC) [F11.20] 04/23/2015  . Amphetamine abuse [F15.10] 04/23/2015  . 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 MDD (major depressive disorder) (HCC) [F32.9] 04/22/2015  . Tobacco use disorder [F17.200] 05/30/2014   Total Time spent with patient: 30 minutes  Past Medical History:  Past Medical History  Diagnosis Date  . Torn rotator cuff left shoulder  . Shingles 2006  . Polysubstance abuse   . Abnormal menstrual periods   . Anxiety     Past Surgical History  Procedure Laterality Date  . Tonsillectomy      25 y/o   . Wisdom tooth extraction      25 y/o   . Polypectomy      As a child multiple colonoscopy's for polyp removal  . Appendectomy    . Laparoscopic  appendectomy N/A 05/23/2014    Procedure: APPENDECTOMY LAPAROSCOPIC;  Surgeon: Harriette Bouillon, MD;  Location: [facility] OR;  Service: General;  Laterality: N/A;  . Laparoscopic lysis of adhesions N/A 05/23/2014    Procedure: LAPAROSCOPIC LYSIS OF ADHESIONS;  Surgeon: Harriette Bouillon, MD;  Location: [facility] OR;  Service: General;  Laterality: N/A;   Family History:  Family History  Problem Relation Age of Onset  . Alcoholism Father    Social History:  History  Alcohol Use No     History  Drug Use No    Comment: 05/23/14 - reports no use for last 4 months    Social History   Social History  . Marital Status: Single    Spouse Name: N/A  . Number of Children: N/A  . Years of Education: N/A   Social History Main Topics  . Smoking status: Current Every Day Smoker -- 1.00 packs/day    Types: Cigarettes  . Smokeless tobacco: Former Neurosurgeon  . Alcohol Use: No  . Drug Use: No     Comment: 05/23/14 - reports no use for last 4 months  . Sexual Activity: Yes    Birth Control/ Protection: Pill   Other Topics  Concern  . None   Social History Narrative    Current Medications: Current Facility-Administered Medications  Medication Dose Route Frequency Provider Last Rate Last Dose  . acetaminophen (TYLENOL) tablet 650 mg  650 mg Oral Q6H PRN Jimmy Footman, MD      . alum & mag hydroxide-simeth (MAALOX/MYLANTA) 200-200-20 MG/5ML suspension 30 mL  30 mL Oral Q4H PRN Jimmy Footman, MD      . cyanocobalamin ((VITAMIN B-12)) injection 1,000 mcg  1,000 mcg Subcutaneous Daily Jimmy Footman, MD   1,000 mcg at 04/27/15 0942  . gabapentin (NEURONTIN) capsule 300 mg  300 mg Oral BID Jimmy Footman, MD   300 mg at 04/27/15 0939  . hydrOXYzine (ATARAX/VISTARIL) tablet 25 mg  25 mg Oral TID PRN Jimmy Footman, MD   25 mg at 04/27/15 0941  . magnesium hydroxide (MILK OF MAGNESIA) suspension 30 mL  30 mL Oral Daily PRN Jimmy Footman, MD      .  naltrexone (DEPADE) tablet 25 mg  25 mg Oral Daily Jimmy Footman, MD   25 mg at 04/27/15 0940  . nicotine (NICODERM CQ - dosed in mg/24 hours) patch 21 mg  21 mg Transdermal Daily Jimmy Footman, MD   21 mg at 04/23/15 1400  . [START ON 04/28/2015] sertraline (ZOLOFT) tablet 25 mg  25 mg Oral Daily Darliss Ridgel, MD        Lab Results:  No results found for this or any previous visit (from the past 48 hour(s)).  Blood Alcohol level:  Lab Results  Component Value Date   ETH 24* 04/16/2015   ETH <5 06/23/2014    Musculoskeletal: Strength & Muscle Tone: within normal limits Gait & Station: normal Patient leans: N/A  Psychiatric Specialty Exam: Review of Systems  Constitutional: Negative.  Negative for fever, chills, weight loss and malaise/fatigue.  HENT: Negative.  Negative for congestion, hearing loss, nosebleeds, sore throat and tinnitus.   Eyes: Negative.  Negative for blurred vision, double vision, photophobia and pain.  Respiratory: Negative.  Negative for cough, hemoptysis, shortness of breath and wheezing.   Cardiovascular: Negative.  Negative for chest pain, palpitations, leg swelling and PND.  Gastrointestinal: Negative.  Negative for heartburn, nausea, vomiting, abdominal pain, diarrhea and constipation.  Genitourinary: Negative.  Negative for dysuria, urgency, frequency and hematuria.  Musculoskeletal: Negative.  Negative for myalgias, back pain and neck pain.  Skin: Negative.  Negative for itching and rash.  Neurological: Negative.  Negative for dizziness, tingling, tremors and headaches.  Endo/Heme/Allergies: Negative.  Does not bruise/bleed easily.    Blood pressure 118/79, pulse 54, temperature 98 F (36.7 C), temperature source Oral, resp. rate 20, height  (1.651 m), weight 67.586 kg (149 lb), SpO2 97 %.Body mass index is 24.79 kg/(m^2).  General Appearance: Fairly Groomed  Patent attorney::  Good  Speech:  Clear and Coherent  Volume:  Normal   Mood:  Euthymic  Affect:  Appropriate  Thought Process:  Linear  Orientation:  Full (Time, Place, and Person)  Thought Content:  Negative  Suicidal Thoughts:  No  Homicidal Thoughts:  No  Memory:  Immediate;   Good Recent;   Good Remote;   Good  Judgement:  Fair  Insight:  Fair  Psychomotor Activity:  EPS  Concentration:  Good  Recall:  Good  Fund of Knowledge:Good  Language: Good  Akathisia:  No  Handed:    AIMS (if indicated):     Assets:  Manufacturing systems engineer Physical Health Social Support  ADL's:  Intact  Cognition: WNL  Sleep:  Number of Hours: 5.75   Treatment Plan Summary:  Major depressive disorder: The patient is complaining of worsening jerking movements today so Zoloft will be decreased back to 25mg  po daily. Due to agitation and problems with anger, consider mood stabilizer  Alcohol use disorder : Continue  naltrexone 25 mg a day. No withdrawal symptoms. She does plan to pursue intensive outpatient treatment at and remains adamantly opposed to any residential substance abuse treatment  Anxiety: Continue Neurontin 300 mg by mouth twice a day. Vistaril 25 mg 3 times a day as needed mg   Insomnia: Will continue Vistaril daily at bedtime when necessary  Tobacco use disorder the patient will be started on nicotine patch 21 mg a day  Addiction issues, alcohol, opiates, cocaine, benzodiazepines, amphetamines: The patient is willing to go to outpatient intensive [facility] program [facility] but is opposed any residential substance abuse treatment at this time. We'll continue to encourage the patient to enter a meaningful recovery program at the time of discharge. She was advised to abstain from alcohol and all illicit drugs as they may worsen mood symptoms.   UTI: The patient was on a course of antibiotics for UTI. Currently no dysuria.   Low vitamin B12: We'll replace vitamin B12 with injections. The patient will be discharged on oral vitamin B-12  supplements.  Labs: TSH wnl,   and pregnancy neg.  vitamin B12 level is low  Precautions every 15 minute checks  Diet regular  Hospitalization and status pt is voluntary-----wanting to signed a 72 hour discharged form which will be up on Monday  Disposition: Patient says she is not longer interested in inpatient substance abuse treatment. She states she wants to be discharged on Monday and will [facility].    Patient has requested to sign a 72 hour discharged form  Follow-up: [facility] intensive outpatient substance abuse program  Levora AngelKAPUR,Prayan Ulin KAMAL, MD 04/27/2015, 1:27 PM

## 2015-04-27 NOTE — Progress Notes (Signed)
Patient with bright affect, cooperative behavior with meals, meds and plan of care. Overly social with peers, attends therapy group. Can be intrusive at times. Patient states she is feeling like she "jerks". MD aware and no new orders. Patient states if writer needs a witness, all the other patients have seen her. No SI/HI/AVH at this time.

## 2015-04-27 NOTE — Plan of Care (Signed)
Problem: Diagnosis: Increased Risk For Suicide Attempt Goal: LTG-Patient Will Report Improved Mood and Deny Suicidal LTG (by discharge) Patient will report improved mood and deny suicidal ideation.  Outcome: Progressing Patient social with peers and attending therapy groups.

## 2015-04-27 NOTE — Progress Notes (Signed)
D: Observed pt interacting with others in day room. Patient alert and oriented x4. Patient denies SI/HI/AVH. Pt affect is appropriate. Pt interacting well with others on the unit. No outbursts this shift.Pt c/o of intermittent random muscular jerks that have started to occur today. Pt believes these muscular jerks may be related to Neurontin. These muscular jerks were not witnessed by staff. Pt can be very talkative, and talked with writer at length about family, mother's passing, and goals going forward. Pt mentioned not being over mother's death and dad's poor health were primary stressors influencing overdose. Pt endorsed wanting to no longer used drugs and alcohol and stated "i"m ready for normalcy." Pt rated depression 4/10 and anxiety 8/10.  A: Offered active listening and support. Provided therapeutic communication. Administered scheduled medications. Talked about using support resources moving forward when recovering from substance abuse. Encouraged pt to continue attending group. Gave vistaril prn for anxiety. R: Pt pleasant and cooperative this shift. Pt rated anxiety 5/10 after prn medication given. Pt medication compliant. Will continue Q15 min. checks. Safety maintained.

## 2015-04-27 NOTE — [facility] Group Notes (Signed)
[facility] Group Notes:  (Nursing/MHT/Case Management/Adjunct)  Date:  04/27/2015  Time:  5:50 PM  Type of Therapy:  Psychoeducational Skills  Participation Level:  Did Not Attend  Lynelle SmokeCara Travis [facility] [facility] 04/27/2015, 5:50 PM

## 2015-04-27 NOTE — [facility] Group Notes (Signed)
  [facility] LCSW Group Therapy  04/27/2015 3:23 PM  Type of Therapy:  Group Therapy  Participation Level:  Minimal  Participation Quality:  Attentive  Affect:  Flat  Cognitive:  Alert  Insight:  Limited  Engagement in Therapy:  Limited  Modes of Intervention:  Discussion, Education, Socialization and Support  Summary of Progress/Problems: Self esteem: Patients discussed self esteem and how it impacts them. They discussed what aspects in their lives has influenced their self esteem. They were challenged to identify changes that are needed in order to improve self esteem. Pt attended a group and stayed the entire time. Pt sat quietly and listened to other group members.   Sempra EnergyCandace L Levelle Edelen MSW, LCSWA  04/27/2015, 3:23 PM

## 2015-04-28 DIAGNOSIS — F603 Borderline personality disorder: Secondary | ICD-10-CM

## 2015-04-28 NOTE — Progress Notes (Signed)
Recreation Therapy Notes  Date: 04.03.17 Time: 1:15 pm Location: Craft Room  Group Topic: Wellness  Goal Area(s) Addresses:  Patient will identify at least one item per dimension of health. Patient will examine areas they are deficient in.  Behavioral Response: Attentive, Interactive, Left early  Intervention: 6 Dimensions of Health  Activity: Patients were given a definition sheet with the 6 dimensions on wellness on it. Patients were given a worksheet with the different dimensions on it and were instructed to list at least one item in each category that they are currently doing.  Education: LRT educated patients on how they can increase each dimension.  Education Outcome: Acknowledges education/In group clarification offered  Clinical Observations/Feedback: Patient completed activity by writing at least one item in each category. Patient contribute to group discussion by stating what areas she is giving enough attention to, what areas she is not giving enough attention to, what she can do to increase certain areas. Patient left group at approximately 1:40 pm. Patient did not return to group.  Jacquelynn CreeGreene,Kamen Hanken M, LRT/CTRS 04/28/2015 4:47 PM

## 2015-04-28 NOTE — Plan of Care (Signed)
Problem: Alteration in mood Goal: LTG-Patient reports reduction in suicidal thoughts (Patient reports reduction in suicidal thoughts and is able to verbalize a safety plan for whenever patient is feeling suicidal)  Outcome: Progressing Patient denies SI at this time.      

## 2015-04-28 NOTE — Progress Notes (Signed)
  [facility] :  Will you be returning to the same living situation after discharge:  Yes,  [place] with her boyfriend At discharge, do you have transportation home?: Yes,  pt will be picked up by her boyfriend Do you have the ability to pay for your medications: Yes,  pt will be provided with prescriptions at discharge  Release of information consent forms completed and in the chart;  Patient's signature needed at discharge.  Patient to Follow up at: Follow-up Information    Follow [facility] with [facility] [place].   Why:  Please arrive to the walk-in clinic for your hospital follow up for medication managment and therapy.   Contact information:   [address] [place], [place] Ph [phone] Fax [phone] (walk in hours M-F 8am-4pm)       [facility] [place].   Why:  Please arrive to the walk-in clinic between 9:00am to 11:30am Monday/Wednesday and Friday for substance abuse treatment and therapy    Contact information:   [address] [place], [place] [address] Phone: [phone] Fax: [phone]      Next level of care provider has access to [facility] Link:no  Safety Planning and Suicide Prevention discussed: Yes,  completred with pt  Have you used any form of tobacco in the last 30 days? (Cigarettes, Smokeless Tobacco, Cigars, and/or Pipes): Yes  Has patient been referred to the Quitline?: Patient refused referral  Patient has been referred for addiction treatment: Yes  Kristen PeaJonathan F Emryn Schneider 04/28/2015, 11:15 AM

## 2015-04-28 NOTE — Progress Notes (Signed)
D: Patient was somewhat tearful when talking during assessment. She stated she didn't want intensive outpatient treatment and she just wanted to be discharged. She denies SI/HI/AVH. She denies any pain. An incident happened later on in the shift when she was found walking into another patients room. MHTs ran to the issue. She got irritable with staff wondering what the big deal was and stated she was just getting the patient's gatorade bottle for him.  A: Medication was given with education. Encouragement was provided. Patient was talked to 1:1 and reminded of the treatment agreement she signed stating she would not go into other patient's room.  R: Patient was compliant with medication. She appeared irritable when being reminded of treatment agreement but was receptive. Safety maintained with 15 min checks.

## 2015-04-28 NOTE — Tx Team (Signed)
Interdisciplinary Treatment Plan Update (Adult)  Date:  04/28/2015 Time Reviewed:  10:44 AM  Progress in Treatment: Attending groups: Intermittently Participating in groups:  Intermittently Taking medication as prescribed:  Yes. Tolerating medication:  Yes. Family/Significant othe contact made:  No, will contact:  patient's grandmother Patient understands diagnosis:  Yes. Discussing patient identified problems/goals with staff:  Yes. Medical problems stabilized or resolved:  Yes. Denies suicidal/homicidal ideation: Yes. Issues/concerns per patient self-inventory:  Yes. Other:  New problem(s) identified: No, Describe:  none reported  Discharge Plan or Barriers: Patient will stabilize and discharge home to [facility] and follow up with [facility] for medication management and therapy and [facility] for intensive outpatient substance abuse treatment and therapy  Reason for Continuation of Hospitalization: Depression Medication stabilization Suicidal ideation Withdrawal symptoms  Comments:  Estimated length of stay: up to 4 days, expected discharge Monday 04/28/15  New goal(s):  Review of initial/current patient goals per problem list:   1.  Goal(s): Participate in aftercare plan    Met:  Yes  Target date: by discharge  As evidenced by: patient will participate in aftercare plan AEB aftercare provider and housing plan identified at discharge 04/24/15: Patient has identified outpatient provider and can return home at discharge if there is no bed at [facility] for substance abuse residential Tx. Goal met.   04/28/15: Patient will stabilize and discharge home to [facility] and follow up with [facility] for medication management and therapy and [facility] for intensive outpatient substance abuse treatment and therapy  2.  Goal (s): Decrease depression   Met:  Adequate for discharge per  MD.  Target date: by discharge  As evidenced by: patient demonstrates decreased symptoms of depression and reports a Depression rating of 3 or less 04/24/15: Patient is isolating and in bed not participating in milieu but is compliant with meds.  4/3: Adequate for discharge per MD.  3.  Goal(s): Patient will demonstrate decreased signs of withdrawal due to substance abuse    Met:  Yes  Target date: by discharge  As evidenced by: Patient will produce a CIWA/COWS score of 0, have stable vitals signs, and no symptoms of withdrawal 04/24/15: Patient will continue to be monitored for symptoms of withdrawal.   4/3: Patient produced a CIWA/COWS score of 0, has stable vitals signs, and no symptoms of withdrawal     Attendees: Physician:  Merlyn Albert, MD 4/3/201710:44 AM  Nursing:   Lucile Shutters, RN 4/3/201710:44 AM  CSW:  Alphonse Guild., Marny Smethers LCSWA 4/3/201710:44 AM  Nursing: Terressa Koyanagi, RN 4/3/201710:44 AM  Nursing: Delfin Edis 4/3/201710:44 AM  Other:  4/3/201710:44 AM  Other:  4/3/201710:44 AM  Other:  4/3/201710:44 AM  Other:  4/3/201710:44 AM  Other:  4/3/201710:44 AM  Other:  4/3/201710:44 AM  Other:   4/3/201710:44 AM   Scribe for Treatment Team:   Claudine Mouton, MSW, [place]  04/28/2015, 10:44 AM  [phone]

## 2015-04-28 NOTE — Progress Notes (Signed)
Recreation Therapy Notes  INPATIENT RECREATION TR PLAN  Patient Details Name: LEYDA VANDERWERF MRN: [id number] DOB: 11-25-1990 Today's Date: 04/28/2015  Rec Therapy Plan Is patient appropriate for Therapeutic Recreation?: Yes Treatment times per week: At least once a week TR Treatment/Interventions: 1:1 session, Group participation (Comment) (Appropriate participation in daily recreation therapy tx)  Discharge Criteria Pt will be discharged from therapy if:: Treatment goals are met, Discharged Treatment plan/goals/alternatives discussed and agreed upon by:: Patient/family  Discharge Summary Short term goals set: See Care Plan Short term goals met: Complete Progress toward goals comments: One-to-one attended Which groups?: Wellness, Social skills, Leisure education, Self-esteem, Goal setting One-to-one attended: Self-esteem, stress management, anger management Reason goals not met: N/A Therapeutic equipment acquired: None Reason patient discharged from therapy: Discharge from hospital Pt/family agrees with progress & goals achieved: Yes Date patient discharged from therapy: 04/28/15   Leonette [facility], LRT/CTRS 04/28/2015, 4:49 PM
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
## 2015-04-28 NOTE — Progress Notes (Signed)
Patient discharge teaching, instructions and medications discussed with patient, she verbalized understanding. Her 7 day medication given patient. All her belongings handed back, she had no concerns. Patient was escorted outside the unit where her boyfriend was waiting for her. No SI, AH/VH verbalized at the time of d/c.

## 2015-07-08 ENCOUNTER — Emergency Department (HOSPITAL_COMMUNITY): Payer: Self-pay

## 2015-07-08 ENCOUNTER — Encounter (HOSPITAL_COMMUNITY): Payer: Self-pay

## 2015-07-08 ENCOUNTER — Emergency Department (HOSPITAL_COMMUNITY)
Admission: EM | Admit: 2015-07-08 | Discharge: 2015-07-08 | Disposition: A | Discharge status: Home / Self Care | Payer: Self-pay | Attending: Emergency Medicine | Admitting: Emergency Medicine

## 2015-07-08 DIAGNOSIS — Z79899 Other long term (current) drug therapy: Secondary | ICD-10-CM | POA: Insufficient documentation

## 2015-07-08 DIAGNOSIS — R0789 Other chest pain: Secondary | ICD-10-CM | POA: Insufficient documentation

## 2015-07-08 DIAGNOSIS — R079 Chest pain, unspecified: Secondary | ICD-10-CM

## 2015-07-08 DIAGNOSIS — F17210 Nicotine dependence, cigarettes, uncomplicated: Secondary | ICD-10-CM | POA: Insufficient documentation

## 2015-07-08 LAB — CBC
HEMATOCRIT: 39.4 % (ref 36.0–46.0)
Hemoglobin: 12.9 g/dL (ref 12.0–15.0)
MCH: 29.2 pg (ref 26.0–34.0)
MCHC: 32.7 g/dL (ref 30.0–36.0)
MCV: 89.1 fL (ref 78.0–100.0)
Platelets: 218 10*3/uL (ref 150–400)
RBC: 4.42 MIL/uL (ref 3.87–5.11)
RDW: 14.9 % (ref 11.5–15.5)
WBC: 7 10*3/uL (ref 4.0–10.5)

## 2015-07-08 LAB — I-STAT TROPONIN, ED: TROPONIN I, POC: 0.01 ng/mL (ref 0.00–0.08)

## 2015-07-08 LAB — BASIC METABOLIC PANEL
Anion gap: 7 (ref 5–15)
BUN: 9 mg/dL (ref 6–20)
CALCIUM: 9.2 mg/dL (ref 8.9–10.3)
CO2: 23 mmol/L (ref 22–32)
Chloride: 107 mmol/L (ref 101–111)
Creatinine, Ser: 0.54 mg/dL (ref 0.44–1.00)
GFR calc Af Amer: >60 mL/min (ref >60)
GLUCOSE: 111 mg/dL — AB (ref 65–99)
Potassium: 3.5 mmol/L (ref 3.5–5.1)
Sodium: 137 mmol/L (ref 135–145)

## 2015-07-08 LAB — I-STAT BETA HCG BLOOD, ED (MC, WL, AP ONLY): I-stat hCG, quantitative: <5 m[IU]/mL (ref <5)

## 2015-07-08 MED ORDER — KETOROLAC TROMETHAMINE 30 MG/ML IJ SOLN
30.0000 mg | Freq: Once | INTRAMUSCULAR | Status: AC
  Administered 2015-07-08: 30 mg via INTRAVENOUS
  Filled 2015-07-08: qty 1

## 2015-07-08 NOTE — ED Provider Notes (Signed)
CSN: [id number]     Arrival date & time 07/08/15  1308 History   First MD Initiated Contact with Patient 07/08/15 [id number]     Chief Complaint  Patient presents with  . Chest Pain     (Consider location/radiation/quality/duration/timing/severity/associated sxs/prior Treatment) HPI 25 year old female who presents with chest pain. She has a history of shingles and polysubstance abuse. States less than 1 week of persistent left-sided chest wall pain that is worse with movement, palpation, and deep inspiration. She cannot think of any inciting factors, including trauma, heavy lifting, or muscle strain. Has had mild shortness of breath, but no syncopal or near syncope. Not worse with exertion. Seems worse with position changes and with laying backwards. No recent illnesses including fevers, cough, congestion, sore throat or runny nose. No lower extremity edema or calf tenderness. No recent immobilization, recent travel, hormone use or OCPs, family or personal history of prior DVT/PE.  Past Medical History  Diagnosis Date  . Torn rotator cuff left shoulder  . Shingles 2006  . Polysubstance abuse   . Abnormal menstrual periods   . Anxiety    Past Surgical History  Procedure Laterality Date  . Tonsillectomy      26 y/o   . Wisdom tooth extraction      25 y/o   . Polypectomy      As a child multiple colonoscopy's for polyp removal  . Appendectomy    . Laparoscopic appendectomy N/A 05/23/2014    Procedure: APPENDECTOMY LAPAROSCOPIC;  Surgeon: Harriette Bouillon, MD;  Location: [facility] OR;  Service: General;  Laterality: N/A;  . Laparoscopic lysis of adhesions N/A 05/23/2014    Procedure: LAPAROSCOPIC LYSIS OF ADHESIONS;  Surgeon: Harriette Bouillon, MD;  Location: [facility] OR;  Service: General;  Laterality: N/A;   Family History  Problem Relation Age of Onset  . Alcoholism Father    Social History  Substance Use Topics  . Smoking status: Current Every Day Smoker -- 1.00 packs/day    Types: Cigarettes  .  Smokeless tobacco: Former Neurosurgeon  . Alcohol Use: No   OB History    No data available     Review of Systems 10/14 systems reviewed and are negative other than those stated in the HPI   Allergies  Dilaudid  Home Medications   Prior to Admission medications   Medication Sig Start Date End Date Taking? Authorizing Provider  hydrOXYzine (ATARAX/VISTARIL) 25 MG tablet Take 1 tablet (25 mg total) by mouth 3 (three) times daily as needed for anxiety (sleep). Patient not taking: Reported on 07/08/2015 04/25/15   Jimmy Footman, MD  sertraline (ZOLOFT) 25 MG tablet Take 1 tablet (25 mg total) by mouth daily. Patient not taking: Reported on 07/08/2015 04/25/15   Jimmy Footman, MD   BP 108/79 mmHg  Pulse 57  Temp(Src) 97.9 F (36.6 C)  Resp 17  Ht  (1.651 m)  Wt 150 lb (68.04 kg)  BMI 24.96 kg/m2  SpO2 96%  LMP 06/11/2015 Physical Exam Physical Exam  Nursing note and vitals reviewed. Constitutional: Well developed, well nourished, non-toxic, and in no acute distress Head: Normocephalic and atraumatic.  Mouth/Throat: Oropharynx is clear and moist.  Neck: Normal range of motion. Neck supple.  Cardiovascular: Normal rate and regular rhythm.   Pulmonary/Chest: Effort normal and breath sounds normal. Tender to palpation over the left chest wall, worse lateral to the sternum Abdominal: Soft. There is no tenderness. There is no rebound and no guarding.  Musculoskeletal: Normal range of motion.  Neurological: Alert,  no facial droop, fluent speech, moves all extremities symmetrically Skin: Skin is warm and dry. No rash Psychiatric: Cooperative  ED Course  Procedures (including critical care time) Labs Review Labs Reviewed  BASIC METABOLIC PANEL - Abnormal; Notable for the following:    Glucose, Bld 111 (*)    All other components within normal limits  CBC  I-STAT TROPOININ, ED  I-STAT BETA HCG BLOOD, ED ([facility], [facility], [facility] ONLY)    Imaging Review Dg Chest 2  View  07/08/2015  CLINICAL DATA:  Chest pain for 1 week EXAM: CHEST  2 VIEW COMPARISON:  April 16, 2015 FINDINGS: Lungs are clear. Heart size and pulmonary vascularity are normal. No adenopathy. No pneumothorax. There is mild mid thoracic dextroscoliosis. IMPRESSION: No edema or consolidation. Electronically Signed   By: Bretta BangWilliam  Woodruff III M.D.   On: 07/08/2015 10:04   I have personally reviewed and evaluated these images and lab results as part of my medical decision-making.   EKG Interpretation   Date/Time:  Tuesday July 08 2015 08:32:51 EDT Ventricular Rate:  69 PR Interval:  116 QRS Duration: 84 QT Interval:  382 QTC Calculation: 409 R Axis:   77 Text Interpretation:  Normal sinus rhythm with sinus arrhythmia Normal ECG  No significant change since last tracing Confirmed by Derra Shartzer MD, Danaka Llera ([id number])  on 07/08/2015 9:55:32 AM      MDM   Final diagnoses:  Chest pain, unspecified chest pain type   In short this is 25 year old female who presents with chest pain. Nontoxic in no acute distress with stable vital signs. EKG nonischemic, without signs pericarditis, heart strain, or stigmata of arrhythmia. Chest x-ray without acute cardiopulmonary processes. Pain seems reproducible with palpation and movement, and almost musculoskeletal in nature vs costochondritis. Currently no rash suggestive of shingles. PERC negative and no concern for PE at this time. Troponin negative and pain not consistent with that of ACS. Given Toradol for pain control with significant improvement in symptoms. Discussed continued supportive care with anti-inflammatory medications for home. Strict return and follow-up instructions reviewed. She expressed understanding of all discharge instructions and felt comfortable with the plan of care.   Lavera Guiseana Duo Addis Bennie, MD 07/08/15 1153

## 2015-07-08 NOTE — Discharge Instructions (Signed)
Your pain seems consistent with that of likely chest wall pain or inflammation of the lining of your ribs or lungs. Take motrin 600-800 mg every 6 hours as needed for pain. REturn for worsening symptoms, including difficulty breathing, worsening pain, passing out, or any other symptoms concerning to you.  Chest Wall Pain Chest wall pain is pain in or around the bones and muscles of your chest. Sometimes, an injury causes this pain. Sometimes, the cause may not be known. This pain may take several weeks or longer to get better. HOME CARE INSTRUCTIONS  Pay attention to any changes in your symptoms. Take these actions to help with your pain:   Rest as told by your health care provider.   Avoid activities that cause pain. These include any activities that use your chest muscles or your abdominal and side muscles to lift heavy items.   If directed, apply ice to the painful area:  Put ice in a plastic bag.  Place a towel between your skin and the bag.  Leave the ice on for 20 minutes, 2-3 times per day.  Take over-the-counter and prescription medicines only as told by your health care provider.  Do not use tobacco products, including cigarettes, chewing tobacco, and e-cigarettes. If you need help quitting, ask your health care provider.  Keep all follow-up visits as told by your health care provider. This is important. SEEK MEDICAL CARE IF:  You have a fever.  Your chest pain becomes worse.  You have new symptoms. SEEK IMMEDIATE MEDICAL CARE IF:  You have nausea or vomiting.  You feel sweaty or light-headed.  You have a cough with phlegm (sputum) or you cough up blood.  You develop shortness of breath.   This information is not intended to replace advice given to you by your health care provider. Make sure you discuss any questions you have with your health care provider.   Document Released: 01/11/2005 Document Revised: 10/02/2014 Document Reviewed: 04/08/2014 Elsevier  Interactive Patient Education 2016 Elsevier Inc.  Nonspecific Chest Pain  Chest pain can be caused by many different conditions. There is always a chance that your pain could be related to something serious, such as a heart attack or a blood clot in your lungs. Chest pain can also be caused by conditions that are not life-threatening. If you have chest pain, it is very important to follow up with your health care provider. CAUSES  Chest pain can be caused by:  Heartburn.  Pneumonia or bronchitis.  Anxiety or stress.  Inflammation around your heart (pericarditis) or lung (pleuritis or pleurisy).  A blood clot in your lung.  A collapsed lung (pneumothorax). It can develop suddenly on its own (spontaneous pneumothorax) or from trauma to the chest.  Shingles infection (varicella-zoster virus).  Heart attack.  Damage to the bones, muscles, and cartilage that make up your chest wall. This can include:  Bruised bones due to injury.  Strained muscles or cartilage due to frequent or repeated coughing or overwork.  Fracture to one or more ribs.  Sore cartilage due to inflammation (costochondritis). RISK FACTORS  Risk factors for chest pain may include:  Activities that increase your risk for trauma or injury to your chest.  Respiratory infections or conditions that cause frequent coughing.  Medical conditions or overeating that can cause heartburn.  Heart disease or family history of heart disease.  Conditions or health behaviors that increase your risk of developing a blood clot.  Having had chicken pox (varicella zoster). SIGNS  AND SYMPTOMS Chest pain can feel like:  Burning or tingling on the surface of your chest or deep in your chest.  Crushing, pressure, aching, or squeezing pain.  Dull or sharp pain that is worse when you move, cough, or take a deep breath.  Pain that is also felt in your back, neck, shoulder, or arm, or pain that spreads to any of these  areas. Your chest pain may come and go, or it may stay constant. DIAGNOSIS Lab tests or other studies may be needed to find the cause of your pain. Your health care provider may have you take a test called an ambulatory ECG (electrocardiogram). An ECG records your heartbeat patterns at the time the test is performed. You may also have other tests, such as:  Transthoracic echocardiogram (TTE). During echocardiography, sound waves are used to create a picture of all of the heart structures and to look at how blood flows through your heart.  Transesophageal echocardiogram (TEE).This is a more advanced imaging test that obtains images from inside your body. It allows your health care provider to see your heart in finer detail.  Cardiac monitoring. This allows your health care provider to monitor your heart rate and rhythm in real time.  Holter monitor. This is a portable device that records your heartbeat and can help to diagnose abnormal heartbeats. It allows your health care provider to track your heart activity for several days, if needed.  Stress tests. These can be done through exercise or by taking medicine that makes your heart beat more quickly.  Blood tests.  Imaging tests. TREATMENT  Your treatment depends on what is causing your chest pain. Treatment may include:  Medicines. These may include:  Acid blockers for heartburn.  Anti-inflammatory medicine.  Pain medicine for inflammatory conditions.  Antibiotic medicine, if an infection is present.  Medicines to dissolve blood clots.  Medicines to treat coronary artery disease.  Supportive care for conditions that do not require medicines. This may include:  Resting.  Applying heat or cold packs to injured areas.  Limiting activities until pain decreases. HOME CARE INSTRUCTIONS  If you were prescribed an antibiotic medicine, finish it all even if you start to feel better.  Avoid any activities that bring on chest  pain.  Do not use any tobacco products, including cigarettes, chewing tobacco, or electronic cigarettes. If you need help quitting, ask your health care provider.  Do not drink alcohol.  Take medicines only as directed by your health care provider.  Keep all follow-up visits as directed by your health care provider. This is important. This includes any further testing if your chest pain does not go away.  If heartburn is the cause for your chest pain, you may be told to keep your head raised (elevated) while sleeping. This reduces the chance that acid will go from your stomach into your esophagus.  Make lifestyle changes as directed by your health care provider. These may include:  Getting regular exercise. Ask your health care provider to suggest some activities that are safe for you.  Eating a heart-healthy diet. A registered dietitian can help you to learn healthy eating options.  Maintaining a healthy weight.  Managing diabetes, if necessary.  Reducing stress. SEEK MEDICAL CARE IF:  Your chest pain does not go away after treatment.  You have a rash with blisters on your chest.  You have a fever. SEEK IMMEDIATE MEDICAL CARE IF:   Your chest pain is worse.  You have an increasing  cough, or you cough up blood.  You have severe abdominal pain.  You have severe weakness.  You faint.  You have chills.  You have sudden, unexplained chest discomfort.  You have sudden, unexplained discomfort in your arms, back, neck, or jaw.  You have shortness of breath at any time.  You suddenly start to sweat, or your skin gets clammy.  You feel nauseous or you vomit.  You suddenly feel light-headed or dizzy.  Your heart begins to beat quickly, or it feels like it is skipping beats. These symptoms may represent a serious problem that is an emergency. Do not wait to see if the symptoms will go away. Get medical help right away. Call your local emergency services (911 in the  [place] Do not drive yourself to the hospital.   This information is not intended to replace advice given to you by your health care provider. Make sure you discuss any questions you have with your health care provider.   Document Released: 10/21/2004 Document Revised: 02/01/2014 Document Reviewed: 08/17/2013 Elsevier Interactive Patient Education [employer]

## 2015-07-08 NOTE — ED Notes (Signed)
Pt reporting L sided chest pain 8/10; RN notified

## 2015-07-08 NOTE — ED Notes (Signed)
Patient complains of 3 weeks of chest pain that she describes as tightness. Pain worse with inspiration and when she is supine, smoker

## 2015-08-15 ENCOUNTER — Encounter (HOSPITAL_COMMUNITY): Payer: Self-pay | Admitting: Emergency Medicine

## 2015-08-15 ENCOUNTER — Emergency Department (HOSPITAL_COMMUNITY): Payer: Self-pay

## 2015-08-15 ENCOUNTER — Observation Stay (HOSPITAL_COMMUNITY)
Admission: EM | Admit: 2015-08-15 | Discharge: 2015-08-16 | Disposition: A | Discharge status: Home / Self Care | Payer: Self-pay | Attending: Internal Medicine | Admitting: Internal Medicine

## 2015-08-15 DIAGNOSIS — F419 Anxiety disorder, unspecified: Secondary | ICD-10-CM | POA: Insufficient documentation

## 2015-08-15 DIAGNOSIS — F332 Major depressive disorder, recurrent severe without psychotic features: Secondary | ICD-10-CM

## 2015-08-15 DIAGNOSIS — F1420 Cocaine dependence, uncomplicated: Secondary | ICD-10-CM

## 2015-08-15 DIAGNOSIS — F329 Major depressive disorder, single episode, unspecified: Secondary | ICD-10-CM | POA: Diagnosis present

## 2015-08-15 DIAGNOSIS — M791 Myalgia: Secondary | ICD-10-CM | POA: Insufficient documentation

## 2015-08-15 DIAGNOSIS — F319 Bipolar disorder, unspecified: Secondary | ICD-10-CM | POA: Insufficient documentation

## 2015-08-15 DIAGNOSIS — H5704 Mydriasis: Secondary | ICD-10-CM | POA: Insufficient documentation

## 2015-08-15 DIAGNOSIS — E876 Hypokalemia: Secondary | ICD-10-CM | POA: Insufficient documentation

## 2015-08-15 DIAGNOSIS — Y905 Blood alcohol level of 100-119 mg/100 ml: Secondary | ICD-10-CM | POA: Insufficient documentation

## 2015-08-15 DIAGNOSIS — F1010 Alcohol abuse, uncomplicated: Secondary | ICD-10-CM | POA: Insufficient documentation

## 2015-08-15 DIAGNOSIS — F1020 Alcohol dependence, uncomplicated: Secondary | ICD-10-CM | POA: Diagnosis present

## 2015-08-15 DIAGNOSIS — F17210 Nicotine dependence, cigarettes, uncomplicated: Secondary | ICD-10-CM | POA: Insufficient documentation

## 2015-08-15 DIAGNOSIS — F1410 Cocaine abuse, uncomplicated: Secondary | ICD-10-CM | POA: Insufficient documentation

## 2015-08-15 DIAGNOSIS — Z79899 Other long term (current) drug therapy: Secondary | ICD-10-CM | POA: Insufficient documentation

## 2015-08-15 DIAGNOSIS — F17200 Nicotine dependence, unspecified, uncomplicated: Secondary | ICD-10-CM | POA: Diagnosis present

## 2015-08-15 DIAGNOSIS — F1210 Cannabis abuse, uncomplicated: Secondary | ICD-10-CM | POA: Insufficient documentation

## 2015-08-15 DIAGNOSIS — R569 Unspecified convulsions: Principal | ICD-10-CM

## 2015-08-15 DIAGNOSIS — F1910 Other psychoactive substance abuse, uncomplicated: Secondary | ICD-10-CM | POA: Insufficient documentation

## 2015-08-15 DIAGNOSIS — Z23 Encounter for immunization: Secondary | ICD-10-CM | POA: Insufficient documentation

## 2015-08-15 DIAGNOSIS — Z915 Personal history of self-harm: Secondary | ICD-10-CM | POA: Insufficient documentation

## 2015-08-15 LAB — CBC WITH DIFFERENTIAL/PLATELET
BASOS PCT: 0 %
Basophils Absolute: 0 10*3/uL (ref 0.0–0.1)
Eosinophils Absolute: 0 10*3/uL (ref 0.0–0.7)
Eosinophils Relative: 0 %
HEMATOCRIT: 42.2 % (ref 36.0–46.0)
HEMOGLOBIN: 14.1 g/dL (ref 12.0–15.0)
LYMPHS ABS: 1.5 10*3/uL (ref 0.7–4.0)
Lymphocytes Relative: 10 %
MCH: 30 pg (ref 26.0–34.0)
MCHC: 33.4 g/dL (ref 30.0–36.0)
MCV: 89.8 fL (ref 78.0–100.0)
MONO ABS: 0.8 10*3/uL (ref 0.1–1.0)
MONOS PCT: 5 %
NEUTROS ABS: 12.4 10*3/uL — AB (ref 1.7–7.7)
NEUTROS PCT: 85 %
PLATELETS: 284 10*3/uL (ref 150–400)
RBC: 4.7 MIL/uL (ref 3.87–5.11)
RDW: 14.3 % (ref 11.5–15.5)
WBC: 14.7 10*3/uL — ABNORMAL HIGH (ref 4.0–10.5)

## 2015-08-15 LAB — COMPREHENSIVE METABOLIC PANEL
ALT: 14 U/L (ref 14–54)
AST: 15 U/L (ref 15–41)
Albumin: 4.2 g/dL (ref 3.5–5.0)
Alkaline Phosphatase: 57 U/L (ref 38–126)
Anion gap: 12 (ref 5–15)
BUN: <5 mg/dL — ABNORMAL LOW (ref 6–20)
CHLORIDE: 106 mmol/L (ref 101–111)
CO2: 20 mmol/L — AB (ref 22–32)
Calcium: 9.4 mg/dL (ref 8.9–10.3)
Creatinine, Ser: 0.66 mg/dL (ref 0.44–1.00)
Glucose, Bld: 127 mg/dL — ABNORMAL HIGH (ref 65–99)
POTASSIUM: 3.1 mmol/L — AB (ref 3.5–5.1)
SODIUM: 138 mmol/L (ref 135–145)
Total Bilirubin: 0.4 mg/dL (ref 0.3–1.2)
Total Protein: 7.1 g/dL (ref 6.5–8.1)

## 2015-08-15 LAB — MRSA PCR SCREENING: MRSA BY PCR: NEGATIVE

## 2015-08-15 LAB — I-STAT CG4 LACTIC ACID, ED
LACTIC ACID, VENOUS: 3.65 mmol/L — AB (ref 0.5–1.9)
LACTIC ACID, VENOUS: 1.91 mmol/L — AB (ref 0.5–1.9)

## 2015-08-15 LAB — RAPID URINE DRUG SCREEN, HOSP PERFORMED
Amphetamines: NOT DETECTED
BENZODIAZEPINES: NOT DETECTED
Barbiturates: NOT DETECTED
COCAINE: POSITIVE — AB
Opiates: NOT DETECTED
Tetrahydrocannabinol: POSITIVE — AB

## 2015-08-15 LAB — I-STAT BETA HCG BLOOD, ED (MC, WL, AP ONLY): I-stat hCG, quantitative: <5 m[IU]/mL (ref <5)

## 2015-08-15 LAB — I-STAT TROPONIN, ED: TROPONIN I, POC: 0 ng/mL (ref 0.00–0.08)

## 2015-08-15 LAB — ETHANOL: Alcohol, Ethyl (B): 104 mg/dL — ABNORMAL HIGH (ref <5)

## 2015-08-15 MED ORDER — SODIUM CHLORIDE 0.9 % IV BOLUS (SEPSIS)
1000.0000 mL | Freq: Once | INTRAVENOUS | Status: AC
  Administered 2015-08-15: 1000 mL via INTRAVENOUS

## 2015-08-15 MED ORDER — SODIUM CHLORIDE 0.9 % IV BOLUS (SEPSIS): 2000.0000 mL | Freq: Once | INTRAVENOUS | Status: DC

## 2015-08-15 MED ORDER — SODIUM CHLORIDE 0.9% FLUSH
3.0000 mL | Freq: Two times a day (BID) | INTRAVENOUS | Status: DC
  Administered 2015-08-15 – 2015-08-16 (×2): 3 mL via INTRAVENOUS

## 2015-08-15 MED ORDER — ENOXAPARIN SODIUM 40 MG/0.4ML ~~LOC~~ SOLN
40.0000 mg | SUBCUTANEOUS | Status: DC
  Administered 2015-08-15 – 2015-08-16 (×2): 40 mg via SUBCUTANEOUS
  Filled 2015-08-15 (×2): qty 0.4

## 2015-08-15 MED ORDER — LORAZEPAM 1 MG PO TABS: 1.0000 mg | ORAL_TABLET | Freq: Four times a day (QID) | ORAL | Status: DC | PRN

## 2015-08-15 MED ORDER — POTASSIUM CHLORIDE IN NACL 40-0.9 MEQ/L-% IV SOLN
INTRAVENOUS | Status: AC
  Administered 2015-08-15 – 2015-08-16 (×2): 100 mL/h via INTRAVENOUS
  Filled 2015-08-15 (×3): qty 1000

## 2015-08-15 MED ORDER — SERTRALINE HCL 50 MG PO TABS
25.0000 mg | ORAL_TABLET | Freq: Every day | ORAL | Status: DC
  Administered 2015-08-16: 25 mg via ORAL
  Filled 2015-08-15: qty 1

## 2015-08-15 MED ORDER — THIAMINE HCL 100 MG/ML IJ SOLN: 100.0000 mg | Freq: Every day | INTRAMUSCULAR | Status: DC

## 2015-08-15 MED ORDER — PNEUMOCOCCAL VAC POLYVALENT 25 MCG/0.5ML IJ INJ
0.5000 mL | INJECTION | INTRAMUSCULAR | Status: AC
  Administered 2015-08-16: 0.5 mL via INTRAMUSCULAR
  Filled 2015-08-15: qty 0.5

## 2015-08-15 MED ORDER — FOLIC ACID 1 MG PO TABS
1.0000 mg | ORAL_TABLET | Freq: Every day | ORAL | Status: DC
  Administered 2015-08-15 – 2015-08-16 (×2): 1 mg via ORAL
  Filled 2015-08-15 (×2): qty 1

## 2015-08-15 MED ORDER — LORAZEPAM 2 MG/ML IJ SOLN
1.0000 mg | Freq: Four times a day (QID) | INTRAMUSCULAR | Status: DC | PRN
  Administered 2015-08-16: 1 mg via INTRAVENOUS
  Filled 2015-08-15: qty 1

## 2015-08-15 MED ORDER — ADULT MULTIVITAMIN W/MINERALS CH
1.0000 | ORAL_TABLET | Freq: Every day | ORAL | Status: DC
  Administered 2015-08-15 – 2015-08-16 (×2): 1 via ORAL
  Filled 2015-08-15 (×2): qty 1

## 2015-08-15 MED ORDER — VITAMIN B-1 100 MG PO TABS
100.0000 mg | ORAL_TABLET | Freq: Every day | ORAL | Status: DC
  Administered 2015-08-15 – 2015-08-16 (×2): 100 mg via ORAL
  Filled 2015-08-15 (×2): qty 1

## 2015-08-15 NOTE — H&P (Signed)
Date: 08/15/2015               Patient Name:  Kristen Schneider MRN: [id number]  DOB: 09/12/1990 Age / Sex: 25 y.o., female   PCP: No Pcp Per Patient              Medical Service: Internal Medicine Teaching Service              Attending Physician: Dr. Doneen Poisson, MD    First Contact: Joaquim Nam, MS IV Pager: [phone]  Second Contact: Dr. Beckie Salts Pager: [phone]            After Hours (After 5p/  First Contact Pager: [phone]  weekends / holidays): Second Contact Pager: [phone]   Chief Complaint:  N/a, patient unable to provide  History of Present Illness:  Kristen Schneider is a 24 yo female with a hx of bipolar dz and polysubstance abuse, including crack, cocaine, heroin, and marijuana who was brought to the ED by her boyfriend after he witnessed multiple seizures and saw foaming of the mouth, per ED notes.  Boyfriend was not present when internal medicine arrived. Patient was hypersomnolent but states that she does not remember any seizure activity, and the last thing she remembers is sitting in bed with her boyfriend smoking cocaine and marijuana.  She was not sure why she was in the hospital.  She denies having a prior history of seizures.  She does not remember hitting her head.  She said she hurt all over, and felt like a truck hit her.  She also has been feeling nauseous and has a headache.  She denies chest pain or SOB.   Patient denied using injection drugs, but prior history suggests that this may not be the case.  She states that she only drinks one or two beers per day.    Meds: No current facility-administered medications for this encounter.   Current Outpatient Prescriptions  Medication Sig Dispense Refill  . hydrOXYzine (ATARAX/VISTARIL) 25 MG tablet Take 1 tablet (25 mg total) by mouth 3 (three) times daily as needed for anxiety (sleep). (Patient not taking: Reported on 07/08/2015) 30 tablet 0  . sertraline (ZOLOFT) 25 MG tablet Take 1 tablet (25 mg total) by mouth  daily. (Patient not taking: Reported on 07/08/2015) 30 tablet 0    Allergies: Allergies as of 08/15/2015 - Review Complete 08/15/2015  Allergen Reaction Noted  . Dilaudid [hydromorphone hcl] Rash 05/24/2014   Past Medical History  Diagnosis Date  . Torn rotator cuff left shoulder  . Shingles 2006  . Polysubstance abuse   . Abnormal menstrual periods   . Anxiety    Past Surgical History  Procedure Laterality Date  . Tonsillectomy      25 y/o   . Wisdom tooth extraction      25 y/o   . Polypectomy      As a child multiple colonoscopy's for polyp removal  . Appendectomy    . Laparoscopic appendectomy N/A 05/23/2014    Procedure: APPENDECTOMY LAPAROSCOPIC;  Surgeon: Harriette Bouillon, MD;  Location: [facility] OR;  Service: General;  Laterality: N/A;  . Laparoscopic lysis of adhesions N/A 05/23/2014    Procedure: LAPAROSCOPIC LYSIS OF ADHESIONS;  Surgeon: Harriette Bouillon, MD;  Location: [facility] OR;  Service: General;  Laterality: N/A;   Family History  Problem Relation Age of Onset  . Alcoholism Father    Social History   Social History  . Marital Status: Single    Spouse Name: N/A  .  Number of Children: N/A  . Years of Education: N/A   Occupational History  . Not on file.   Social History Main Topics  . Smoking status: Current Every Day Smoker -- 1.00 packs/day    Types: Cigarettes  . Smokeless tobacco: Former NeurosurgeonUser  . Alcohol Use: 0.0 oz/week    0 Standard drinks or equivalent per week  . Drug Use: Yes    Special: Cocaine, Marijuana     Comment: 05/23/14 - reports no use for last 4 months  . Sexual Activity: Yes    Birth Control/ Protection: Pill   Other Topics Concern  . Not on file   Social History Narrative    Review of Systems: Pertinent items noted in HPI and remainder of comprehensive ROS otherwise negative.  Physical Exam: Blood pressure 117/68, pulse 68, temperature 99.3 F (37.4 C), temperature source Oral, resp. rate 21, height 5\' 6"  (1.676 m), weight 63.504 kg  (140 lb), SpO2 97 %.  General: Patient very drowsy, laying in bed in no acute distress Head: AT/Treasure Eyes: dilated pupils, equally reactive to light, EOMI CV: RRR, no m/r/g Pulm: CTAB Abd: s/t/nd Skin: flushed chest, no bruises, rashes, or cuts seen Ext: no edema or cyanosis, 10+ scars noted on left wrist Neuro: Patient wakes to respond to questions, CN II-XII grossly intact, strength intact with weak effort   Lab results: CBC Latest Ref Rng 08/15/2015 07/08/2015 04/18/2015  WBC 4.0 - 10.5 K/uL 14.7(H) 7.0 8.2  Hemoglobin 12.0 - 15.0 g/dL 14.714.1 82.912.9 11.4(L)  Hematocrit 36.0 - 46.0 % 42.2 39.4 34.5(L)  Platelets 150 - 400 K/uL 284 218 243    BMP Latest Ref Rng 08/15/2015 07/08/2015 04/18/2015  Glucose 65 - 99 mg/dL 562(Z127(H) 308(M111(H) 92  BUN 6 - 20 mg/dL <5(H<5(L) 9 <8(I<5(L)  Creatinine 0.44 - 1.00 mg/dL 6.960.66 2.950.54 2.840.51  Sodium 135 - 145 mmol/L 138 137 138  Potassium 3.5 - 5.1 mmol/L 3.1(L) 3.5 3.5  Chloride 101 - 111 mmol/L 106 107 111  CO2 22 - 32 mmol/L 20(L) 23 22  Calcium 8.9 - 10.3 mg/dL 9.4 9.2 1.3(K8.4(L)    Drugs of Abuse     Component Value Date/Time   LABOPIA NONE DETECTED 08/15/2015 1530   LABOPIA NEGATIVE 11/01/2011 2116   COCAINSCRNUR POSITIVE* 08/15/2015 1530   COCAINSCRNUR NEGATIVE 11/01/2011 2116   LABBENZ NONE DETECTED 08/15/2015 1530   LABBENZ NEGATIVE 11/01/2011 2116   AMPHETMU NONE DETECTED 08/15/2015 1530   AMPHETMU NEGATIVE 11/01/2011 2116   THCU POSITIVE* 08/15/2015 1530   THCU POSITIVE 11/01/2011 2116   LABBARB NONE DETECTED 08/15/2015 1530   LABBARB NEGATIVE 11/01/2011 2116    Ethanol: 104  Lactic Acid, Venous    Component Value Date/Time   LATICACIDVEN 1.91* 08/15/2015 1749    Troponin (Point of Care Test)  Recent Labs  08/15/15 1402  TROPIPOC 0.00     Imaging results:  Ct Head Wo Contrast  08/15/2015  CLINICAL DATA:  Seizure like activity today.  Lethargic. EXAM: CT HEAD WITHOUT CONTRAST TECHNIQUE: Contiguous axial images were obtained from the base  of the skull through the vertex without intravenous contrast. COMPARISON:  Head CT dated 11/02/2011. FINDINGS: Brain: Ventricles are normal in size and configuration. All areas of the brain demonstrate normal gray-white matter differentiation. There is no mass, hemorrhage, edema or other evidence of acute parenchymal abnormality. No extra-axial hemorrhage. Vascular: No hyperdense vessel or unexpected calcification. Skull: Negative for fracture or focal lesion. Sinuses/Orbits: No acute findings. Visualized upper paranasal sinuses are clear.  Mastoid air cells are clear. Other: None. IMPRESSION: Normal head CT. Electronically Signed   By: Bary Richard M.D.   On: 08/15/2015 14:36     Assessment & Plan by Problem:  New onset seizure: Patient does not remember seizure, but boyfriend spoke to ED stating that she had multiple seizures, fell stiff and fell on the bed, with foaming at the mouth while they were smoking crack and marijuana together.  Patient has no known seizure history and appears to be in a post-ictal state of drowsiness/confusion. Patient's head CT was negative, so unlikely trauma related.  Labs showed lactic acidosis and leukocytosis, likely 2/2 seizure activity.  Father or boyfriend did not answer phone calls, but it will be important to speak to them to elucidate whether patient has a prior seizure history.  We will need to speak to the boyfriend so we can figure out whether this was an actual seizure.  If patient had a seizure, we can consider starting her on an anticonvulsant medication.  With her prior history of attempted suicide by pill ingestion, we will have to be cautious about prescribing.  For now, we will hydrate patient and continue attempting to contact family and boyfriend.  -162mL/hr NS -contact family -telemetry -neuro checks q4h  Hx of heavy alcohol use:  Information gathered from chart review suggests patient has a heavy drinking hx, and her current ethanol level is  104. -CIWA precautions  Cocaine use disorder:  Patients vitals have all remained stable within normal limits on this admission, she is not experiencing agitation, chest pain, SOB, or focal neurologic symptoms.  Patient did have dilated pupils and states that she recently smoked crack/cocaine, however her somnolence and stable vitals makes me less worried about cocaine toxicity.    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  Unable to assess psych yet due to hypersomnolence. -continue home sertraline  once daily   This is a Psychologist, occupational Note.  The care of the patient was discussed with Dr. Beckie Salts and the assessment and plan was formulated with their assistance.  Please see their note for official documentation of the patient encounter.   Signed: Jannet Askew, Med Student 08/15/2015, 5:40 PM

## 2015-08-15 NOTE — ED Notes (Signed)
Onset today patient's boyfriend called EMS for possible first time seizure. Patient stood up from bed fell onto bed entire body shaking and foaming at the mouth. EMS arrived patient ax4 refused transport.  Boyfriend stated to EMS both of them used cocaine today.  EMS called out a second time by boyfriend for patient being lethargic. Answering and following commands intermittently.

## 2015-08-15 NOTE — ED Notes (Signed)
Pt not following commands when asked, per EMT, pt was able to get herself on and off the bedpan as well as take her clothing on and off.

## 2015-08-15 NOTE — H&P (Signed)
Date: 08/15/2015               Patient Name:  Kristen Schneider MRN: [id number]  DOB: July 05, 1990 Age / Sex: 25 y.o., female   PCP: No Pcp Per Patient         Medical Service: Internal Medicine Teaching Service         Attending Physician: Dr. Doneen Poisson, MD    First Contact: Joaquim Nam Pager: [phone]  Second Contact: Dr. Beckie Salts Pager: [phone]       After Hours (After 5p/  First Contact Pager: [phone]  weekends / holidays): Second Contact Pager: [phone]   Chief Complaint: New Onset Seizure   History of Present Illness: Ms. Kristen Schneider is a 25yo woman with PMHx of major depressive disorder and polysubstance abuse (cocaine, alcohol) who presents today after having a seizure at home. Per ED notes, she was brought to the ED by her boyfriend after he witnessed multiple seizures and saw foaming of the mouth. Patient was somnolent but states that she does not remember any seizure activity, and the last thing she remembers is sitting in bed with her boyfriend smoking cocaine and marijuana. She was not sure why she was in the hospital. She denies having a prior history of seizures. She does not remember hitting her head. She said she hurt all over, and felt like a truck hit her. She also has been feeling nauseous and has a headache. She denies chest pain or SOB. Patient denied using injection drugs, but prior history suggests that this may not be the case. She states that she only drinks one or two beers daily.   Meds: Current Facility-Administered Medications  Medication Dose Route Frequency Provider Last Rate Last Dose  . 0.9 % NaCl with KCl 40 mEq / L  infusion   Intravenous Continuous Porschia Willbanks J Quadarius Henton, MD      . enoxaparin (LOVENOX) injection 40 mg  40 mg Subcutaneous Q24H Camylle Whicker J Kolbi Altadonna, MD      . sodium chloride flush (NS) 0.9 % injection 3 mL  3 mL Intravenous Q12H Keilyn Haggard Arlyce Harman, MD        Allergies: Allergies as of 08/15/2015 - Review Complete 08/15/2015  Allergen  Reaction Noted  . Dilaudid [hydromorphone hcl] Rash 05/24/2014   Past Medical History  Diagnosis Date  . Torn rotator cuff left shoulder  . Shingles 2006  . Polysubstance abuse   . Abnormal menstrual periods   . Anxiety     Family History: Father- alcoholism  Social History: Current everyday smoker, 1 ppd. Alcohol use- 1-2 beers daily. Also uses cocaine and marijuana. Lives with boyfriend and his mother.   Review of Systems: A complete ROS was negative except as per HPI.   Physical Exam: Blood pressure 110/65, pulse 62, temperature 98.2 F (36.8 C), temperature source Oral, resp. rate 15, height  (1.676 m), weight 140 lb (63.504 kg), SpO2 97 %. General: Young woman resting in bed, very drowsy, NAD HEENT: /AT, dilated pupils, equally reactive to light, EOMI CV: RRR, no m/r/g Pulm: CTA bilaterally Abd: BS+, soft, non-tender  Ext: no edema, 10+ linear scars noted on left wrist Neuro: Patient wakes to respond to questions. EOMI, PERRL. Tongue midline. Shoulder shrug intact. Strength 4+/5 in all extremities, poor effort on exam.  EKG: Sinus rhythm Borderline right axis deviation Minimal ST depression, inferior leads Borderline prolonged QT interval Baseline wander in lead(s) V2  CT Head: Normal.   Assessment & Plan by Problem:  New onset seizure: She presented with multiple seizures per her boyfriend's report. She apparently became stiff and fell on the bed, with foaming at the mouth while they were smoking crack and marijuana together. Patient has no known seizure history and appears to be in a post-ictal state of drowsiness/confusion. CT head was negative. Labs showed lactic acidosis and leukocytosis, likely 2/2 seizure activity. Neither the patient's father nor her boyfriend answered our phone calls, but it will be important to speak to them to elucidate whether patient has a prior seizure history. We will need to speak to the boyfriend so we can figure out whether  this was an actual seizure. If patient had a seizure, we can consider starting her on an anticonvulsant medication. With her prior history of attempted suicide by pill ingestion, we will have to be cautious about prescribing. For now, we will hydrate patient and continue attempting to contact family and boyfriend.  - Continue NS at 100 - Contact family - Telemetry - Neuro checks q4h  Hx of heavy alcohol use: Information gathered from chart review suggests patient has a heavy drinking hx, and her current ethanol level is 104. - CIWA precautions  Cocaine use disorder: Patients vitals have all remained stable within normal limits on this admission, she is not experiencing agitation, chest pain, SOB, or focal neurologic symptoms. Patient did have dilated pupils and states that she recently smoked crack/cocaine, however her somnolence and stable vitals makes me less worried about cocaine toxicity.   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 Unable to assess psych yet due to hypersomnolence. - Continue home sertraline 25mg  once daily   Diet: NPO, can have regular diet when more awake DVT PPx: Lovenox SQ Dispo: Admit patient to Observation with expected length of stay less than 2 midnights.  Signed: Su Hoffarly J Kavion Mancinas, MD 08/15/2015, 7:18 PM  Pager: [phone]

## 2015-08-15 NOTE — ED Notes (Signed)
Patient stated "I have to use the bathroom bad" indicated had to urinate. Nurse tech assisted with bedpan patient lifted hips and placed on bedpan without incident.

## 2015-08-15 NOTE — ED Notes (Signed)
Father arrived at bedside speaking with provider

## 2015-08-15 NOTE — Progress Notes (Signed)
Counted home medication in patient's presence with 2nd RN present.  Initial count was 22. 1 pill fell on floor and was wasted with second RN, count adjusted prior to taking med to pharmacy.

## 2015-08-15 NOTE — ED Provider Notes (Signed)
CSN: [id number]     Arrival date & time 08/15/15  1342 History   First MD Initiated Contact with Patient 08/15/15 1343     Chief Complaint  Patient presents with  . Fatigue  . Seizures     (Consider location/radiation/quality/duration/timing/severity/associated sxs/prior Treatment) The history is provided by the patient, medical records and the EMS personnel. No language interpreter was used.   Kristen Schneider is a 25 y.o. female  with a hx of Polysubstance abuse, bipolar disorder presents to the Emergency Department via EMS for persistent lethargy.  EMS reports that upon their arrival to the residence patient's boyfriend was present stating she had a seizure this morning. He reported to them that patient stood up, body became stiff, fell onto the bed with her entire body shaking and foaming at the mouth.  He also stated patient was alert when the first EMS unit arrived this morning after the first seizure and she refused transport at that time. He reports that he recontacted EMS after she continued to be lethargic. He reported that they both use cocaine today.  Boyfriend is not at bedside to assist with history of this time.  LEVEL 5 CAVEAT for AMS.     After painful stimulation, patient admits to cocaine usage. She reports she takes an unknown medication for bipolar disorder and reports that she has a significant headache. Unable to obtain any further history from her at this time.   Past Medical History  Diagnosis Date  . Torn rotator cuff left shoulder  . Shingles 2006  . Polysubstance abuse   . Abnormal menstrual periods   . Anxiety    Past Surgical History  Procedure Laterality Date  . Tonsillectomy      25 y/o   . Wisdom tooth extraction      25 y/o   . Polypectomy      As a child multiple colonoscopy's for polyp removal  . Appendectomy    . Laparoscopic appendectomy N/A 05/23/2014    Procedure: APPENDECTOMY LAPAROSCOPIC;  Surgeon: Harriette Bouillonhomas Cornett, MD;  [facility];   Service: General;  Laterality: N/A;  . Laparoscopic lysis of adhesions N/A 05/23/2014    Procedure: LAPAROSCOPIC LYSIS OF ADHESIONS;  Surgeon: Harriette Bouillonhomas Cornett, MD;  Location: [facility] OR;  Service: General;  Laterality: N/A;   Family History  Problem Relation Age of Onset  . Alcoholism Father    Social History  Substance Use Topics  . Smoking status: Current Every Day Smoker -- 1.00 packs/day    Types: Cigarettes  . Smokeless tobacco: Former NeurosurgeonUser  . Alcohol Use: 0.0 oz/week    0 Standard drinks or equivalent per week   OB History    No data available     Review of Systems  Unable to perform ROS: Mental status change  Neurological: Positive for seizures.      Allergies  Dilaudid  Home Medications   Prior to Admission medications   Medication Sig Start Date End Date Taking? Authorizing Provider  hydrOXYzine (ATARAX/VISTARIL) 25 MG tablet Take 1 tablet (25 mg total) by mouth 3 (three) times daily as needed for anxiety (sleep). Patient not taking: Reported on 07/08/2015 04/25/15   Jimmy FootmanAndrea Hernandez-Gonzalez, MD  sertraline (ZOLOFT) 25 MG tablet Take 1 tablet (25 mg total) by mouth daily. Patient not taking: Reported on 07/08/2015 04/25/15   Jimmy FootmanAndrea Hernandez-Gonzalez, MD   BP 115/76 mmHg  Pulse 93  Temp(Src) 99.3 F (37.4 C) (Oral)  Resp 18  Ht 5\' 6"  (1.676 m)  Wt 63.504 kg  BMI 22.61 kg/m2  SpO2 97%  LMP  Physical Exam  Constitutional: She appears well-developed and well-nourished. She appears lethargic. No distress.  Patient lethargic.  With painful stimuli she will arouse and answer some questions before falling asleep again.  She follows commands intermittently.  HENT:  Head: Normocephalic and atraumatic.  Right Ear: Tympanic membrane, external ear and ear canal normal.  Left Ear: Tympanic membrane, external ear and ear canal normal.  Nose: Nose normal. No epistaxis. Right sinus exhibits no maxillary sinus tenderness and no frontal sinus tenderness. Left sinus exhibits no  maxillary sinus tenderness and no frontal sinus tenderness.  Mouth/Throat: Uvula is midline, oropharynx is clear and moist and mucous membranes are normal. Mucous membranes are not pale and not cyanotic. No oropharyngeal exudate, posterior oropharyngeal edema, posterior oropharyngeal erythema or tonsillar abscesses.  Oral trauma to the tongue noted  Eyes: Conjunctivae are normal. Pupils are equal, round, and reactive to light. No scleral icterus.  Pupils equal and reactive but dilated  Neck: Normal range of motion and full passive range of motion without pain. Neck supple.  Cardiovascular: Normal rate, regular rhythm, normal heart sounds and intact distal pulses.   No murmur heard. Capillary refill less than 3 seconds  Pulmonary/Chest: Effort normal and breath sounds normal. No stridor. No respiratory distress. She has no wheezes.  Equal chest expansion Clear and equal breath sounds  Abdominal: Soft. Bowel sounds are normal. She exhibits no mass. There is no tenderness. There is no rebound and no guarding.  Soft and nontender  Musculoskeletal: Normal range of motion. She exhibits no edema.  Lymphadenopathy:    She has no cervical adenopathy.  Neurological: She appears lethargic.  Speech is clear and goal oriented Moves extremities without ataxia  Skin: Skin is warm and dry. No rash noted. She is not diaphoretic.  Questionable track marks noted to the left antecubital fossa and top of right foot  Psychiatric: She has a normal mood and affect.  Nursing note and vitals reviewed.   ED Course  Procedures (including critical care time) Labs Review Labs Reviewed  CBC WITH DIFFERENTIAL/PLATELET - Abnormal; Notable for the following:    WBC 14.7 (*)    Neutro Abs 12.4 (*)    All other components within normal limits  COMPREHENSIVE METABOLIC PANEL - Abnormal; Notable for the following:    Potassium 3.1 (*)    CO2 20 (*)    Glucose, Bld 127 (*)    BUN <5 (*)    All other components within  normal limits  ETHANOL - Abnormal; Notable for the following:    Alcohol, Ethyl (B) 104 (*)    All other components within normal limits  I-STAT CG4 LACTIC ACID, ED - Abnormal; Notable for the following:    Lactic Acid, Venous 3.65 (*)    All other components within normal limits  URINE RAPID DRUG SCREEN, HOSP PERFORMED  I-STAT TROPOININ, ED  I-STAT BETA HCG BLOOD, ED ([facility], [facility], [facility] ONLY)    Imaging Review Ct Head Wo Contrast  08/15/2015  CLINICAL DATA:  Seizure like activity today.  Lethargic. EXAM: CT HEAD WITHOUT CONTRAST TECHNIQUE: Contiguous axial images were obtained from the base of the skull through the vertex without intravenous contrast. COMPARISON:  Head CT dated 11/02/2011. FINDINGS: Brain: Ventricles are normal in size and configuration. All areas of the brain demonstrate normal gray-white matter differentiation. There is no mass, hemorrhage, edema or other evidence of acute parenchymal abnormality. No extra-axial hemorrhage. Vascular: No  hyperdense vessel or unexpected calcification. Skull: Negative for fracture or focal lesion. Sinuses/Orbits: No acute findings. Visualized upper paranasal sinuses are clear. Mastoid air cells are clear. Other: None. IMPRESSION: Normal head CT. Electronically Signed   By: Bary Richard M.D.   On: 08/15/2015 14:36   I have personally reviewed and evaluated these images and lab results as part of my medical decision-making.   EKG Interpretation   Date/Time:  Friday August 15 2015 13:51:15 EDT Ventricular Rate:  93 PR Interval:    QRS Duration: 93 QT Interval:  390 QTC Calculation: 486 R Axis:   93 Text Interpretation:  Sinus rhythm Borderline right axis deviation Minimal  ST depression, inferior leads Borderline prolonged QT interval Baseline  wander in lead(s) V2 No significant change since last tracing Confirmed by  YAO  MD, DAVID ([id number]) on 08/15/2015 2:04:59 PM       The patient is noted to have an elevated lactate but <4. With the current  information available to me, I don't think the patient is in septic shock. The lactate is related to seizure activity.  MDM   Final diagnoses:  Hypokalemia  New onset seizure (HCC)  Polysubstance abuse   KINDRA BICKHAM resents after what is presumed to be a first-time seizure.  She remains lethargic. CT head without abnormality.  Elevated lactic acid presumed from seizure activity.  Leukocytosis, likely from same.  Fluids given. No evidence of sepsis.  Hypokalemia At 3.1.   3:25 PM Pt remains somewhat altered.  Pt father at bedside for several minutes before leaving again.  He reports that boyfriend told him the patient has had several seizures this morning.  Father however did not witness any of this and boyfriend has not yet shown up.  Father also reported patient had a Neurontin overdose 3 months ago requiring intubation in the ICU.  Father: Deaundra Dupriest [phone]  Discussed with neurology and will admit.    3:51 PM Discussed with Dr. Beckie Salts who will admit.    Dahlia Client Lewi Drost, PA-C 08/15/15 1551  Charlynne Pander, MD 08/15/15 [phone]

## 2015-08-15 NOTE — Consult Note (Signed)
NEURO HOSPITALIST CONSULT NOTE   Requestig physician: Dr. Josem Kaufmann   Reason for Consult: seizure   HPI:                                                                                                                                          Kristen Schneider is an 25 y.o. female brought to hospital after her boyfriend witnessed multiple seizures. Per patient she was doing cocaine, smoking and drinking this AM.  Currently she is lethargic and post ictal with no clinical seizures. She is drowsy but able to provide information and follow some commands but has very minimal effort. She is very labile and is crying intermittently. She has no history of seizure. Denies any history of head trauma.   Past Medical History  Diagnosis Date  . Torn rotator cuff left shoulder  . Shingles 2006  . Polysubstance abuse   . Abnormal menstrual periods   . Anxiety     Past Surgical History  Procedure Laterality Date  . Tonsillectomy      25 y/o   . Wisdom tooth extraction      25 y/o   . Polypectomy      As a child multiple colonoscopy's for polyp removal  . Appendectomy    . Laparoscopic appendectomy N/A 05/23/2014    Procedure: APPENDECTOMY LAPAROSCOPIC;  Surgeon: Harriette Bouillon, MD;  Location: [facility] OR;  Service: General;  Laterality: N/A;  . Laparoscopic lysis of adhesions N/A 05/23/2014    Procedure: LAPAROSCOPIC LYSIS OF ADHESIONS;  Surgeon: Harriette Bouillon, MD;  Location: [facility] OR;  Service: General;  Laterality: N/A;    Family History  Problem Relation Age of Onset  . Alcoholism Father     Social History:  reports that she has been smoking Cigarettes.  She has been smoking about 1.00 pack per day. She has quit using smokeless tobacco. She reports that she drinks alcohol. She reports that she uses illicit drugs (Cocaine and Marijuana).  Allergies  Allergen Reactions  . Dilaudid [Hydromorphone Hcl] Rash    Rash and itching    MEDICATIONS:                                                                                                                      No  current facility-administered medications for this encounter.   Current Outpatient Prescriptions  Medication Sig Dispense Refill  . hydrOXYzine (ATARAX/VISTARIL) 25 MG tablet Take 1 tablet (25 mg total) by mouth 3 (three) times daily as needed for anxiety (sleep). (Patient not taking: Reported on 07/08/2015) 30 tablet 0  . sertraline (ZOLOFT) 25 MG tablet Take 1 tablet (25 mg total) by mouth daily. (Patient not taking: Reported on 07/08/2015) 30 tablet 0       ROS:                                                                                                                                       History obtained from the patient  General ROS: negative for - chills, fatigue, fever, night sweats, weight gain or weight loss Psychological ROS: negative for - behavioral disorder, hallucinations, memory difficulties, mood swings or suicidal ideation Ophthalmic ROS: negative for - blurry vision, double vision, eye pain or loss of vision ENT ROS: negative for - epistaxis, nasal discharge, oral lesions, sore throat, tinnitus or vertigo Allergy and Immunology ROS: negative for - hives or itchy/watery eyes Hematological and Lymphatic ROS: negative for - bleeding problems, bruising or swollen lymph nodes Endocrine ROS: negative for - galactorrhea, hair pattern changes, polydipsia/polyuria or temperature intolerance Respiratory ROS: negative for - cough, hemoptysis, shortness of breath or wheezing Cardiovascular ROS: negative for - chest pain, dyspnea on exertion, edema or irregular heartbeat Gastrointestinal ROS: negative for - abdominal pain, diarrhea, hematemesis, nausea/vomiting or stool incontinence Genito-Urinary ROS: negative for - dysuria, hematuria, incontinence or urinary frequency/urgency Musculoskeletal ROS: negative for - joint swelling or muscular weakness Neurological ROS: as noted in HPI Dermatological  ROS: negative for rash and skin lesion changes   Blood pressure 123/84, pulse 81, temperature 99.3 F (37.4 C), temperature source Oral, resp. rate 12, height 5\' 6"  (1.676 m), weight 140 lb (63.504 kg), SpO2 99 %.   Neurologic Examination:                                                                                                      HEENT-  Normocephalic, no lesions, without obvious abnormality.  Normal external eye and conjunctiva.  Normal TM's bilaterally.  Normal auditory canals and external ears. Normal external nose, mucus membranes and septum.  Normal pharynx. Cardiovascular- S1, S2 normal, pulses palpable throughout   Lungs- chest clear, no wheezing, rales, normal symmetric air entry Abdomen- normal findings: bowel sounds normal Extremities- no edema Lymph-no adenopathy palpable Musculoskeletal-no joint tenderness,  deformity or swelling Skin-warm and dry, no hyperpigmentation, vitiligo, or suspicious lesions  Neurological Examination Mental Status: Drowsy but easily arousable, oriented to hospital but not which hospital.  Speech fluent without evidence of aphasia.  Able to follow simple commands without difficulty but this is intermittent and effort dependent.  Cranial Nerves: II:  Visual fields grossly normal, pupils equal, round, reactive to light and accommodation III,IV, VI: ptosis not present, extra-ocular motions intact bilaterally V,VII: smile symmetric, facial light touch sensation normal bilaterally VIII: hearing normal bilaterally IX,X: uvula rises symmetrically XI: bilateral shoulder shrug XII: midline tongue extension Motor: Provides no effort but able to move all extremities antigravity with noxious stimuli.  Sensory: Pinprick and light touch intact throughout, bilaterally Deep Tendon Reflexes: 2+ and symmetric throughout Plantars: Right: downgoing   Left: downgoing Cerebellar: Would not take part in exam Gait: not tested      Lab Results: Basic  Metabolic Panel:  Recent Labs Lab 08/15/15 1345  NA 138  K 3.1*  CL 106  CO2 20*  GLUCOSE 127*  BUN <5*  CREATININE 0.66  CALCIUM 9.4    Liver Function Tests:  Recent Labs Lab 08/15/15 1345  AST 15  ALT 14  ALKPHOS 57  BILITOT 0.4  PROT 7.1  ALBUMIN 4.2   No results for input(s): LIPASE, AMYLASE in the last 168 hours. No results for input(s): AMMONIA in the last 168 hours.  CBC:  Recent Labs Lab 08/15/15 1345  WBC 14.7*  NEUTROABS 12.4*  HGB 14.1  HCT 42.2  MCV 89.8  PLT 284    Cardiac Enzymes: No results for input(s): CKTOTAL, CKMB, CKMBINDEX, TROPONINI in the last 168 hours.  Lipid Panel: No results for input(s): CHOL, TRIG, HDL, CHOLHDL, VLDL, LDLCALC in the last 168 hours.  CBG: No results for input(s): GLUCAP in the last 168 hours.  Microbiology: Results for orders placed or performed during the hospital encounter of 04/16/15  MRSA PCR Screening     Status: None   Collection Time: 04/16/15  2:53 PM  Result Value Ref Range Status   MRSA by PCR NEGATIVE NEGATIVE Final    Comment:        The GeneXpert MRSA Assay (FDA approved for NASAL specimens only), is one component of a comprehensive MRSA colonization surveillance program. It is not intended to diagnose MRSA infection nor to guide or monitor treatment for MRSA infections.     Coagulation Studies: No results for input(s): LABPROT, INR in the last 72 hours.  Imaging: Ct Head Wo Contrast  08/15/2015  CLINICAL DATA:  Seizure like activity today.  Lethargic. EXAM: CT HEAD WITHOUT CONTRAST TECHNIQUE: Contiguous axial images were obtained from the base of the skull through the vertex without intravenous contrast. COMPARISON:  Head CT dated 11/02/2011. FINDINGS: Brain: Ventricles are normal in size and configuration. All areas of the brain demonstrate normal gray-white matter differentiation. There is no mass, hemorrhage, edema or other evidence of acute parenchymal abnormality. No extra-axial  hemorrhage. Vascular: No hyperdense vessel or unexpected calcification. Skull: Negative for fracture or focal lesion. Sinuses/Orbits: No acute findings. Visualized upper paranasal sinuses are clear. Mastoid air cells are clear. Other: None. IMPRESSION: Normal head CT. Electronically Signed   By: Bary Richard M.D.   On: 08/15/2015 14:36       Assessment and plan per attending neurologist  Felicie Morn PA-C [facility] [phone]  08/15/2015, 3:57 PM   Assessment/Plan: 25 year old female who presents with seizures in the setting of cocaine use. She appears postictal,  but I wonder if there is some psychogenic component to her current refusal to move. Her exam was inconsistent, given me know effort until I pinched her at which point she gave very good strength.  I would not start antiepileptics unless an EEG is abnormal or she would have further seizures.  1) EEG 2) MRI if the patient does not come back to baseline  Ritta Slot, MD [facility] [phone]  If 7pm- 7am, please page neurology on call as listed in AMION.

## 2015-08-15 NOTE — ED Notes (Signed)
Notified CT regarding CT of head.

## 2015-08-16 ENCOUNTER — Observation Stay (HOSPITAL_COMMUNITY): Payer: Self-pay

## 2015-08-16 LAB — BASIC METABOLIC PANEL
ANION GAP: 4 — AB (ref 5–15)
BUN: 5 mg/dL — ABNORMAL LOW (ref 6–20)
CALCIUM: 8.4 mg/dL — AB (ref 8.9–10.3)
CO2: 23 mmol/L (ref 22–32)
Chloride: 110 mmol/L (ref 101–111)
Creatinine, Ser: 0.6 mg/dL (ref 0.44–1.00)
GFR calc non Af Amer: >60 mL/min (ref >60)
GLUCOSE: 91 mg/dL (ref 65–99)
Potassium: 3.4 mmol/L — ABNORMAL LOW (ref 3.5–5.1)
Sodium: 137 mmol/L (ref 135–145)

## 2015-08-16 LAB — CK: Total CK: 99 U/L (ref 38–234)

## 2015-08-16 MED ORDER — POTASSIUM CHLORIDE CRYS ER 20 MEQ PO TBCR
40.0000 meq | EXTENDED_RELEASE_TABLET | Freq: Once | ORAL | Status: AC
  Administered 2015-08-16: 40 meq via ORAL
  Filled 2015-08-16: qty 2

## 2015-08-16 NOTE — Progress Notes (Signed)
EEG completed, results pending. 

## 2015-08-16 NOTE — Progress Notes (Signed)
Patient and boyfriend wants to get off cocaine and weed use and they needed help or rehab but neither one  of them  have any insurance.

## 2015-08-16 NOTE — Discharge Instructions (Signed)
It was a pleasure taking care of you, Kristen Schneider. You were hospitalized for a seizure due to cocaine and alcohol.  Please remember: - Stop using all cocaine, alcohol, and other drugs. These things increase your risk of having another seizure. - Look into the resources the social worker provided you to abstain from alcohol and drug abuse  Take care, Dr. Beckie Salts

## 2015-08-16 NOTE — Progress Notes (Signed)
Discharge to home with family.  Home med, Risperdal,  that was sent to the pharmacy will be picked up by boyfriend.

## 2015-08-16 NOTE — Progress Notes (Signed)
Subjective: Kristen Schneider is much more alert this morning and states that she feels much better. She still does not remember having a seizure, but states her boyfriend said she had 2 seizures yesterday after smoking cocaine all night.  Apparently this is more than she normally smokes. Her father and boyfriend were still not present, but patient says they deny her ever having a seizure before this. She still is sore all over, stating again that she feels like she was "hit by a car."  Her right calf is especially tender to her.  She denies nausea, vomiting, chest pain, SOB or neurologic deficits.  She is requesting food.  She says she does not want to leave the hospital because she's scared of having another seizure.     Objective: Vital signs in last 24 hours: Filed Vitals:   08/16/15 0400 08/16/15 0506 08/16/15 0757 08/16/15 0800  BP: 107/74   110/78  Pulse:  48 41 66  Temp:    97.7 F (36.5 C)  TempSrc:    Oral  Resp: 22   19  Height:      Weight:      SpO2: 98%   97%   Weight change:   Intake/Output Summary (Last 24 hours) at 08/16/15 1003 Last data filed at 08/16/15 0700  Gross per 24 hour  Intake 1758.33 ml  Output      0 ml  Net 1758.33 ml   General: Patient AxO x3, laying in bed comfortably in no acute distress Head: AT/[place] Eyes: pupils nondilated, equally reactive to light, EOMI CV: RRR, no m/r/g Pulm: CTAB Abd: tenderness to palpation in all four quadrants Skin: flushed chest, no bruises, rashes, or cuts seen Ext: no cyanosis, 10+ scars noted on left wrist, some bruising and edema on her right knee Neuro: Patient wakes to respond to questions, CN II-XII grossly intact, strength intact with weak effort  Lab Results: BMP Latest Ref Rng 08/16/2015 08/15/2015 07/08/2015  Glucose 65 - 99 mg/dL 91 962(X) 528(U)  BUN 6 - 20 mg/dL 5(L) <1(L) 9  Creatinine 0.44 - 1.00 mg/dL 2.44 0.10 2.72  Sodium 135 - 145 mmol/L 137 138 137  Potassium 3.5 - 5.1 mmol/L 3.4(L) 3.1(L) 3.5    Chloride 101 - 111 mmol/L 110 106 107  CO2 22 - 32 mmol/L 23 20(L) 23  Calcium 8.9 - 10.3 mg/dL 5.3(G) 9.4 9.2     Micro Results: Recent Results (from the past 240 hour(s))  MRSA PCR Screening     Status: None   Collection Time: 08/15/15  6:06 PM  Result Value Ref Range Status   MRSA by PCR NEGATIVE NEGATIVE Final    Comment:        The GeneXpert MRSA Assay (FDA approved for NASAL specimens only), is one component of a comprehensive MRSA colonization surveillance program. It is not intended to diagnose MRSA infection nor to guide or monitor treatment for MRSA infections.    Studies/Results: Ct Head Wo Contrast  08/15/2015  CLINICAL DATA:  Seizure like activity today.  Lethargic. EXAM: CT HEAD WITHOUT CONTRAST TECHNIQUE: Contiguous axial images were obtained from the base of the skull through the vertex without intravenous contrast. COMPARISON:  Head CT dated 11/02/2011. FINDINGS: Brain: Ventricles are normal in size and configuration. All areas of the brain demonstrate normal gray-white matter differentiation. There is no mass, hemorrhage, edema or other evidence of acute parenchymal abnormality. No extra-axial hemorrhage. Vascular: No hyperdense vessel or unexpected calcification. Skull: Negative for fracture or focal lesion.  Sinuses/Orbits: No acute findings. Visualized upper paranasal sinuses are clear. Mastoid air cells are clear. Other: None. IMPRESSION: Normal head CT. Electronically Signed   By: Bary Richard M.D.   On: 08/15/2015 14:36   Medications: I have reviewed the patient's current medications. Scheduled Meds: . enoxaparin (LOVENOX) injection  40 mg Subcutaneous Q24H  . folic acid  1 mg Oral Daily  . multivitamin with minerals  1 tablet Oral Daily  . pneumococcal 23 valent vaccine  0.5 mL Intramuscular Tomorrow-1000  . sertraline  25 mg Oral Daily  . sodium chloride flush  3 mL Intravenous Q12H  . thiamine  100 mg Oral Daily   Or  . thiamine  100 mg Intravenous  Daily   Continuous Infusions:  PRN Meds:.LORazepam **OR** LORazepam Assessment/Plan:  New onset seizure:  Patient is much more alert today, but still does not remember any seizure activity.  Boyfriend and dad still not present, but patient states that they deny she's ever had a seizure before. Patient's seizure was likely precipitated by her heavy cocaine use the night before, especially since she states that it was more than she normally smoked.  Patient's BMP did not show any electrolyte abnormalities that may have precipitated this, and CT ruled out trauma or or cerebrovascular disease.  We counseled patient that it would be important for her to discontinue drug use to prevent future seizures.  We have ordered an EEG per neurology recommendation to assess seizure activity. -EEG ordered -d/c telemetry -patient counseled on drug cessation -d/c neuro checks  Muscle Pain:  Patient continues to endorse feeling like she was hit by a car, and saying that her right calf is extremely sensitive.  Tonic clonic seizures and drug use can precipitate rhabdomyolysis, so we ordered a creatinine kinase to assess. She did not look to see if her urine was red this morning -f/u CK   Hx of heavy alcohol use: Information gathered from chart review suggests patient has a heavy drinking hx, and her ethanol level on admission was 104. -CIWA precautions  Cocaine use disorder: Patients vitals have all remained stable within normal limits on this admission, she is not experiencing agitation, chest pain, SOB, or focal neurologic symptoms. Patient did have dilated pupils on admission and states that she recently smoked crack/cocaine, however her somnolence and stable vitals makes me less worried about cocaine toxicity.   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 Unable to assess psych yet due to hypersomnolence. -continue home sertraline 25mg  once daily  This is a Psychologist, occupational Note.  The care of the patient was discussed with Dr. Beckie Salts and the assessment and plan formulated with their assistance.  Please see their attached note for official documentation of the daily encounter.     Jannet Askew, Med Student 08/16/2015, 10:03 AM

## 2015-08-16 NOTE — Progress Notes (Signed)
   Subjective: No acute events overnight. She is much more awake and alert this morning. She complains of diffuse muscle pains, but especially has pain in her right calf.   Objective: Vital signs in last 24 hours: Filed Vitals:   08/16/15 0400 08/16/15 0506 08/16/15 0757 08/16/15 0800  BP: 107/74   110/78  Pulse:  48 41 66  Temp:    97.7 F (36.5 C)  TempSrc:    Oral  Resp: 22   19  Height:      Weight:      SpO2: 98%   97%   Physical Exam General: young woman sitting up in bed, alert, NAD HEENT: [place]/AT, EOMI, sclera anicteric, mucus membranes moist CV: RRR, no m/g/r  Pulm: CTA bilaterally, breaths non-labored Abd: BS+, soft, mild diffuse tenderness to palpation, no guarding Ext: Lower extremities appear symmetric, no erythema or swelling. Tenderness to palpation in both lower extremities but more so in right calf.  Neuro: alert and oriented x 3. CNs II-XII intact.   Assessment/Plan:  New onset seizure: Likely secondary to cocaine and alcohol abuse. Given she has no hx of seizures and this was likely drug induced an anti-epileptic does not seem necessary at this time. I would also caution prescribing anti-epileptics to her with her hx of multiple suicide attempts by prescription drug ingestion. - f/u EEG - Neuro checks q4h - Counseled on cocaine and alcohol cessation - Neuro following, appreciate recommendations   Diffuse Muscle Pains: There is concern for rhabdomyolysis given her diffuse tenderness to palpation of her extremities on exam in the setting of recent cocaine use and seizure. We will check a CK level and if elevated start IVFs.  - f/u CK level   Hx of heavy alcohol use: Information gathered from chart review suggests patient has a heavy drinking hx, and her admission ethanol level was 104. 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  - Continue home sertraline 25mg  once daily   Diet: Regular  DVT PPx: Lovenox SQ Dispo: Discharge possibly today depending upon EEG and CK results     Su Hoff, MD 08/16/2015, 10:02 AM Pager: [phone]

## 2015-08-16 NOTE — Progress Notes (Signed)
Subjective: No further seizures since prior to admission.  She states she hurts all over this morning.  Exam: Filed Vitals:   08/16/15 0757 08/16/15 0800  BP:  110/78  Pulse: 41 66  Temp:  97.7 F (36.5 C)  Resp:  19    HEENT-  Normocephalic, no lesions, without obvious abnormality.  Normal external eye and conjunctiva.  Normal  external ears. Normal external nose  Normal pharynx. Cardiovascular- regular rate and rhythm and S1, S2 normal, bradycardic, pulses palpable throughout   Lungs- chest clear, no wheezing, rales, normal symmetric air entry Abdomen- normal findings: bowel sounds normal Extremities- less then 2 second capillary refill and no edema Lymph-no adenopathy palpable Musculoskeletal-no joint tenderness, deformity or swelling Skin-dry and tattoos    Gen: In bed, NAD MS: awake, alert, follows 3-step commands, speech  Fluent without dysarthria CN: II-XII intact Motor: MAEE strength 5/5 throughout Sensory: Pinprick and light touch intact DTR: 2+ and symmetric throughout  Pertinent Labs/Diagnostics: K+ 3.4 UDS + cocaine  & tetrahydrocannabiol  Cameron Schwinn, MSN, RN Neurology [phone]  Impression: 25 yo female that presented to the ED after multiple witnessed seizures.  She reported doing cocaine and drinking prior to the episodes.  She is much improved today and has returned to baseline with no further seizures.  She would like to go home as soon as possible and this was communicated to the primary team.  EEG is pending and if it is normal then she can be discharged.   Recommendations: 1) Obtain EEG     08/16/2015, 9:56 AM

## 2015-08-16 NOTE — Procedures (Signed)
EEG Report  Clinical history:  Seizure in the setting of cocaine use  Technical Summary:  A 19  Channel digital EEG recording was performed using the 10-20 international system of electrode placement.  Bipolar and referential montages were used.  The total recording time was approx 20 minutes.  Findings:  There is a well developed and well regulated posterior dominant rhythm of 9 Hz reactive to eye opening and closure.  Hyperventilation and photic stimulation were not performed.  Sleep was not recorded.  Throughout the recording, there was no focal slowing, epileptiform discharges, or electrographic seizures present.    Impression:  This is a normal EEG in the awake.  There is no evidence of a seizure tendency on this recording, however, it does not rule out underlying epilepsy.  If warranted, a sleep deprived EEG and/or a more prolonged EEG may be of additional value.   Weston Settle, MS, MD

## 2015-08-24 NOTE — Discharge Summary (Signed)
Name: Kristen Schneider MRN: [id number] DOB: 14-Mar-1990 25 y.o. PCP: No Pcp Per Patient  Date of Admission: 08/15/2015  1:42 PM Date of Discharge: 08/16/15 Attending Physician: Dr. Josem Kaufmann  Discharge Diagnosis: Principal Problem New Onset Seizure Active Problems Diffuse Muscle Pains Hx Heavy Alcohol Use Cocaine Use Disorder Major Depressive Disorder w/ Prior Suicide Attempts   Discharge Medications:   Medication List    TAKE these medications   hydrOXYzine 25 MG tablet Commonly known as:  ATARAX/VISTARIL Take 1 tablet (25 mg total) by mouth 3 (three) times daily as needed for anxiety (sleep).   risperiDONE 0.25 MG tablet Commonly known as:  RISPERDAL Take 0.25 mg by mouth daily.   sertraline 25 MG tablet Commonly known as:  ZOLOFT Take 1 tablet (25 mg total) by mouth daily.       Disposition and follow-up:   KristenKristen Schneider was discharged from [facility] in Good condition.  At the hospital follow up visit please address:  1.  Seizure- Determine if she has had any additional seizures.  Polysubstance abuse- Determine if still using alcohol and cocaine. Counsel on drug cessation if needed. Depression- Assess symptoms, determine if sertraline needs to be titrated up  2.  Labs / imaging needed at time of follow-up: None  3.  Pending labs/ test needing follow-up: None   Follow-up Appointments:   Hospital Course by problem list:  New Onset Seizure: Patient presented with a new onset seizure in the setting of smoking cocaine, marijuana, and drinking alcohol that morning. She reportedly had got up from the bed when her body became stiff and she fell back on the bed and started shaking all over and foaming at the mouth. This had been witnessed by her boyfriend. On admission, her CT head was negative for any acute pathology. No prior history of seizures. She was somnolent on admission, but by the following day was alert and oriented x 3. Her new onset  seizure was attributed to her cocaine and alcohol abuse and thus did not require antiepileptic therapy. Neurology had been consulted and performed an EEG which was normal. She was counseled on drug and alcohol cessation in preventing future seizures as well as other harms caused by these agents.    Diffuse Muscle Pains: She had complained of diffuse muscle aches and pains the day following admission. There was concern for rhabdomyolysis with her diffuse tenderness on exam, especially in her extremities and in the setting of recent cocaine use and seizure. A CK level was checked and normal at 99. Her muscle pains were attributed to her recent seizure activity.   Polysubstance Abuse: Her UDS was positive for cocaine and THC on admission and her EtOH was elevated at 104. She was counseled extensively on the need for drug and alcohol cessation given her seizure activity and also counseled on the harms of these agents in general.   Discharge Vitals:   BP 110/78 (BP Location: Left Arm)   Pulse 60   Temp 97.9 F (36.6 C) (Oral)   Resp 19   Ht  (1.676 m)   Wt 140 lb (63.5 kg)   SpO2 100%   BMI 22.60 kg/m  Physical Exam General: young woman sitting up in bed, alert, NAD HEENT: [place]/AT, EOMI, sclera anicteric, mucus membranes moist CV: RRR, no m/g/r  Pulm: CTA bilaterally, breaths non-labored Abd: BS+, soft, mild diffuse tenderness to palpation, no guarding Ext: Lower extremities appear symmetric, no erythema or swelling. Tenderness to palpation  in both lower extremities but more so in right calf.  Neuro: alert and oriented x 3. CNs II-XII intact.   Pertinent Labs, Studies, and Procedures:  EtOH 104 UDS +cocaine, +THC CK 99  CT Head 08/15/15: Normal  EEG 08/16/15: Normal. No evidence of seizure activity, but does not rule out underlying epilepsy.    Discharge Instructions: It was a pleasure taking care of you, Kristen Schneider. You were hospitalized for a seizure due to cocaine and  alcohol.  Please remember: - Stop using all cocaine, alcohol, and other drugs. These things increase your risk of having another seizure. - Look into the resources the social worker provided you to abstain from alcohol and drug abuse  Take care, Dr. Beckie Salts  Discharge Instructions    Diet - low sodium heart healthy    Complete by:  As directed   Increase activity slowly    Complete by:  As directed      Signed: Su Hoff, MD 08/24/2015, 9:02 AM   Pager: [phone]

## 2015-09-25 ENCOUNTER — Encounter (HOSPITAL_COMMUNITY): Payer: Self-pay | Admitting: Emergency Medicine

## 2015-09-25 ENCOUNTER — Emergency Department (HOSPITAL_COMMUNITY)
Admission: EM | Admit: 2015-09-25 | Discharge: 2015-09-25 | Disposition: A | Discharge status: Home / Self Care | Payer: Medicaid Other | Attending: Emergency Medicine | Admitting: Emergency Medicine

## 2015-09-25 DIAGNOSIS — N63 Unspecified lump in unspecified breast: Secondary | ICD-10-CM

## 2015-09-25 DIAGNOSIS — F17210 Nicotine dependence, cigarettes, uncomplicated: Secondary | ICD-10-CM | POA: Insufficient documentation

## 2015-09-25 NOTE — ED Provider Notes (Signed)
[facility]-EMERGENCY DEPT Provider Note   CSN: [id number] Arrival date & time: 09/25/15  1807  By signing my name below, I, Phillis Haggis, attest that this documentation has been prepared under the direction and in the presence of Felicie Morn, NP-C. Electronically Signed: Phillis Haggis, ED Scribe. 09/25/15. 7:00 PM.   History   Chief Complaint Chief Complaint  Patient presents with  . Breast Mass   The history is provided by the patient. No language interpreter was used.   HPI Comments: Kristen Schneider is a 25 y.o. Female with a hx of abnormal menstrual periods and polysubstance abuse who presents to the Emergency Department complaining of left breast pain onset 5 days ago. Pt states that there is a small lump behind her left nipple. She reports worsening pain with palpation of the breast. Pt is currently on her menstrual period. Pt states that she has had her nipples pierced twice and has squeezed white discharge from the nipple in the past. She tried to squeeze the nipple to see if discharge would come out, but it was too painful. Pt reports hx of cancer on maternal and paternal sides of family. She has not tried anything for her symptoms. She denies fever or chills.   Past Medical History:  Diagnosis Date  . Abnormal menstrual periods   . Anxiety   . Polysubstance abuse   . Shingles 2006  . Torn rotator cuff left shoulder    Patient Active Problem List   Diagnosis Date Noted  . New onset seizure (HCC) 08/15/2015  . Hypokalemia   . Polysubstance abuse   . 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 Vitamin B12 deficiency 04/25/2015  . Alcohol use disorder, severe, dependence (HCC) 04/23/2015  . Cannabis use disorder, moderate, dependence (HCC) 04/23/2015  . Cocaine use disorder, moderate, dependence (HCC) 04/23/2015  . Opioid use disorder, moderate, dependence (HCC) 04/23/2015  . 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 Sedative, hypnotic or anxiolytic use disorder, moderate  04/23/2015  . MDD (major depressive disorder) (HCC) 04/22/2015  . Tobacco use disorder 05/30/2014    Past Surgical History:  Procedure Laterality Date  . APPENDECTOMY    . LAPAROSCOPIC APPENDECTOMY N/A 05/23/2014   Procedure: APPENDECTOMY LAPAROSCOPIC;  Surgeon: Harriette Bouillon, MD;  Location: [facility] OR;  Service: General;  Laterality: N/A;  . LAPAROSCOPIC LYSIS OF ADHESIONS N/A 05/23/2014   Procedure: LAPAROSCOPIC LYSIS OF ADHESIONS;  Surgeon: Harriette Bouillon, MD;  Location: [facility] OR;  Service: General;  Laterality: N/A;  . POLYPECTOMY     As a child multiple colonoscopy's for polyp removal  . TONSILLECTOMY     25 y/o   . WISDOM TOOTH EXTRACTION     25 y/o     OB History    No data available       Home Medications    Prior to Admission medications   Medication Sig Start Date End Date Taking? Authorizing Provider  hydrOXYzine (ATARAX/VISTARIL) 25 MG tablet Take 1 tablet (25 mg total) by mouth 3 (three) times daily as needed for anxiety (sleep). 04/25/15   Jimmy Footman, MD  risperiDONE (RISPERDAL) 0.25 MG tablet Take 0.25 mg by mouth daily.    Historical Provider, MD  sertraline (ZOLOFT) 25 MG tablet Take 1 tablet (25 mg total) by mouth daily. 04/25/15   Jimmy Footman, MD    Family History Family History  Problem Relation Age of Onset  . Alcoholism Father     Social History Social History  Substance Use Topics  . Smoking status: Current Every Day  Smoker    Packs/day: 1.00    Types: Cigarettes  . Smokeless tobacco: Former NeurosurgeonUser  . Alcohol use 0.0 oz/week     Allergies   Dilaudid [hydromorphone hcl]   Review of Systems Review of Systems  Constitutional: Negative for chills and fever.  Skin:       Lump under left nipple  All other systems reviewed and are negative.    Physical Exam Updated Vital Signs BP 105/65 (BP Location: Right Arm)   Pulse 78   Temp 98.2 F (36.8 C) (Oral)   Resp 16   LMP 09/25/2015   SpO2 99%   Physical Exam    Constitutional: She is oriented to person, place, and time. She appears well-developed and well-nourished.  HENT:  Head: Normocephalic and atraumatic.  Eyes: Conjunctivae and EOM are normal. Pupils are equal, round, and reactive to light.  Neck: Normal range of motion. Neck supple.  Cardiovascular: Normal rate and regular rhythm.   Pulmonary/Chest: Effort normal and breath sounds normal. Left breast exhibits mass. Left breast exhibits no nipple discharge, no skin change and no tenderness.  Left breast- at the 2 o'clock position of the areola there is a small 1.5 x 1.5 cm mass; no redness, increased warmth, or tenderness  Musculoskeletal: Normal range of motion.  Neurological: She is alert and oriented to person, place, and time.  Skin: Skin is warm and dry.  Psychiatric: She has a normal mood and affect. Her behavior is normal.  Nursing note and vitals reviewed.    ED Treatments / Results  DIAGNOSTIC STUDIES: Oxygen Saturation is 99% on RA, normal by my interpretation.    COORDINATION OF CARE: 7:00 PM-Discussed treatment plan which includes follow up with [place] [facility] with pt at bedside and pt agreed to plan.    Labs (all labs ordered are listed, but only abnormal results are displayed) Labs Reviewed - No data to display  EKG  EKG Interpretation None       Radiology No results found.  Procedures Procedures (including critical care time)  Medications Ordered in ED Medications - No data to display   Initial Impression / Assessment and Plan / ED Course  I have reviewed the triage vital signs and the nursing notes.  Pertinent labs & imaging results that were available during my care of the patient were reviewed by me and considered in my medical decision making (see chart for details).  Clinical Course  Superficial breast mass. No signs of infection. No nipple discharge. Strong family history of breast cancer. Follow-up with [facility] for  anticipated referral to Breast imaging center.  Return precautions discussed.    Final Clinical Impressions(s) / ED Diagnoses   Final diagnoses:  None  I personally performed the services described in this documentation, which was scribed in my presence. The recorded information has been reviewed and is accurate.   New Prescriptions New Prescriptions   No medications on file     Felicie MornDavid Malani Lees, NP 09/26/15 [phone]    Zadie Rhineonald Wickline, MD 09/30/15 2016

## 2015-09-25 NOTE — ED Triage Notes (Signed)
Pt states past 4-5 days, left breast has been painful, and small lump behind nipple. Painful with palpation.

## 2015-09-25 NOTE — ED Notes (Signed)
Assessment: Small lump behind L nipple, no discharge noted or redness, just pain

## 2015-12-22 ENCOUNTER — Emergency Department (HOSPITAL_COMMUNITY): Payer: Self-pay

## 2015-12-22 ENCOUNTER — Encounter (HOSPITAL_COMMUNITY): Payer: Self-pay | Admitting: *Deleted

## 2015-12-22 ENCOUNTER — Emergency Department (HOSPITAL_COMMUNITY)
Admission: EM | Admit: 2015-12-22 | Discharge: 2015-12-22 | Disposition: A | Discharge status: Home / Self Care | Payer: Self-pay | Attending: Emergency Medicine | Admitting: Emergency Medicine

## 2015-12-22 DIAGNOSIS — M25562 Pain in left knee: Secondary | ICD-10-CM | POA: Insufficient documentation

## 2015-12-22 DIAGNOSIS — F17210 Nicotine dependence, cigarettes, uncomplicated: Secondary | ICD-10-CM | POA: Insufficient documentation

## 2015-12-22 DIAGNOSIS — Y929 Unspecified place or not applicable: Secondary | ICD-10-CM | POA: Insufficient documentation

## 2015-12-22 DIAGNOSIS — Y939 Activity, unspecified: Secondary | ICD-10-CM | POA: Insufficient documentation

## 2015-12-22 DIAGNOSIS — Y999 Unspecified external cause status: Secondary | ICD-10-CM | POA: Insufficient documentation

## 2015-12-22 LAB — POC URINE PREG, ED: Preg Test, Ur: NEGATIVE

## 2015-12-22 MED ORDER — NAPROXEN 500 MG PO TABS: 500.0000 mg | ORAL_TABLET | Freq: Two times a day (BID) | ORAL | 0 refills | Status: DC

## 2015-12-22 MED ORDER — NAPROXEN 250 MG PO TABS
500.0000 mg | ORAL_TABLET | Freq: Once | ORAL | Status: AC
  Administered 2015-12-22: 500 mg via ORAL
  Filled 2015-12-22: qty 2

## 2015-12-22 NOTE — Discharge Instructions (Signed)
Your xrays today are normal.  Start taking naproxen twice daily.  Follow up with Dr. Eulah PontMurphy for persistent symptoms.  Return to the ED for sudden worsening pain, increased swelling, fever, or any new or concerning symptoms.
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
## 2015-12-22 NOTE — ED Notes (Signed)
Declined W/C at D/C and was escorted to lobby by RN. 

## 2015-12-22 NOTE — ED Notes (Signed)
Patient currently in xray ?

## 2016-01-03 ENCOUNTER — Encounter (HOSPITAL_COMMUNITY): Payer: Self-pay

## 2016-01-03 ENCOUNTER — Emergency Department (HOSPITAL_COMMUNITY)
Admission: EM | Admit: 2016-01-03 | Discharge: 2016-01-03 | Disposition: A | Discharge status: Home / Self Care | Payer: Medicaid Other | Attending: Emergency Medicine | Admitting: Emergency Medicine

## 2016-01-03 ENCOUNTER — Emergency Department (HOSPITAL_COMMUNITY): Payer: Medicaid Other

## 2016-01-03 DIAGNOSIS — Z79899 Other long term (current) drug therapy: Secondary | ICD-10-CM | POA: Insufficient documentation

## 2016-01-03 DIAGNOSIS — F17210 Nicotine dependence, cigarettes, uncomplicated: Secondary | ICD-10-CM | POA: Insufficient documentation

## 2016-01-03 DIAGNOSIS — H6501 Acute serous otitis media, right ear: Secondary | ICD-10-CM | POA: Insufficient documentation

## 2016-01-03 DIAGNOSIS — J40 Bronchitis, not specified as acute or chronic: Secondary | ICD-10-CM | POA: Insufficient documentation

## 2016-01-03 MED ORDER — AZITHROMYCIN 250 MG PO TABS: 250.0000 mg | ORAL_TABLET | Freq: Every day | ORAL | 0 refills | Status: DC

## 2016-01-03 MED ORDER — GUAIFENESIN-CODEINE 100-10 MG/5ML PO SOLN
5.0000 mL | Freq: Three times a day (TID) | ORAL | 0 refills | Status: DC | PRN
Start: 2016-01-03 — End: 2016-08-31

## 2016-01-03 MED ORDER — HYDROCOD POLST-CPM POLST ER 10-8 MG/5ML PO SUER
5.0000 mL | Freq: Once | ORAL | Status: AC
  Administered 2016-01-03: 5 mL via ORAL
  Filled 2016-01-03: qty 5

## 2016-01-03 NOTE — ED Triage Notes (Signed)
Patient complains of 1 week of cough, nasal drainage and right earache. Using otc meds with minimal relief, NAD

## 2016-01-03 NOTE — ED Notes (Signed)
Declined W/C at D/C and was escorted to lobby by RN. 
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
## 2016-01-03 NOTE — Discharge Instructions (Signed)
Do not drive while taking the cough medication as it will make you sleepy. °

## 2016-03-10 ENCOUNTER — Encounter (HOSPITAL_COMMUNITY): Payer: Self-pay | Admitting: Emergency Medicine

## 2016-03-10 ENCOUNTER — Emergency Department (HOSPITAL_COMMUNITY)
Admission: EM | Admit: 2016-03-10 | Discharge: 2016-03-10 | Disposition: A | Discharge status: Home / Self Care | Payer: Medicaid Other | Attending: Emergency Medicine | Admitting: Emergency Medicine

## 2016-03-10 DIAGNOSIS — N6320 Unspecified lump in the left breast, unspecified quadrant: Secondary | ICD-10-CM | POA: Insufficient documentation

## 2016-03-10 DIAGNOSIS — F17210 Nicotine dependence, cigarettes, uncomplicated: Secondary | ICD-10-CM | POA: Insufficient documentation

## 2016-03-10 NOTE — ED Provider Notes (Signed)
[facility]PT Provider Note   CSN: [id number] Arrival date & time: 03/10/16  1609   By signing my name below, I, Clarisse Gouge, attest that this documentation has been prepared under the direction and in the presence of Langston Masker, [place] . Electronically Signed: Clarisse Gouge, Scribe. 03/10/16. 5:27 PM.   History   Chief Complaint Chief Complaint  Patient presents with  . lump on breast   The history is provided by the patient and medical records. No language interpreter was used.    HPI Comments: Kristen Schneider is a 26 y.o. female who presents to the Emergency Department complaining of a gradually worsening lump on the left breast x 6 months. She states she was seen for the problem 4 months ago in [facility] ED where no imaging or tests were done. She adds that she thought the problem was subsiding until she noticed the lump return larger ~2 weeks ago. Pt denies fever or chills.   Past Medical History:  Diagnosis Date  . Abnormal menstrual periods   . Anxiety   . Polysubstance abuse   . Shingles 2006  . Torn rotator cuff left shoulder    Patient Active Problem List   Diagnosis Date Noted  . New onset seizure (HCC) 08/15/2015  . Hypokalemia   . Polysubstance abuse   . 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 Vitamin B12 deficiency 04/25/2015  . Alcohol use disorder, severe, dependence (HCC) 04/23/2015  . Cannabis use disorder, moderate, dependence (HCC) 04/23/2015  . Cocaine use disorder, moderate, dependence (HCC) 04/23/2015  . Opioid use disorder, moderate, dependence (HCC) 04/23/2015  . 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 MDD (major depressive disorder) 04/22/2015  . Tobacco use disorder 05/30/2014    Past Surgical History:  Procedure Laterality Date  . APPENDECTOMY    . LAPAROSCOPIC APPENDECTOMY N/A 05/23/2014   Procedure: APPENDECTOMY LAPAROSCOPIC;  Surgeon: Harriette Bouillon, MD;  Location: [facility]  OR;  Service: General;  Laterality: N/A;  . LAPAROSCOPIC LYSIS OF ADHESIONS N/A 05/23/2014   Procedure: LAPAROSCOPIC LYSIS OF ADHESIONS;  Surgeon: Harriette Bouillon, MD;  Location: [facility] OR;  Service: General;  Laterality: N/A;  . POLYPECTOMY     As a child multiple colonoscopy's for polyp removal  . TONSILLECTOMY     26 y/o   . WISDOM TOOTH EXTRACTION     26 y/o     OB History    No data available       Home Medications    Prior to Admission medications   Medication Sig Start Date End Date Taking? Authorizing Provider  azithromycin (ZITHROMAX) 250 MG tablet Take 1 tablet (250 mg total) by mouth daily. Take first 2 tablets together, then 1 every day until finished. 01/03/16   Hope Orlene Och, NP  guaiFENesin-codeine 100-10 MG/5ML syrup Take 5 mLs by mouth 3 (three) times daily as needed for cough. 01/03/16   Hope Orlene Och, NP  hydrOXYzine (ATARAX/VISTARIL) 25 MG tablet Take 1 tablet (25 mg total) by mouth 3 (three) times daily as needed for anxiety (sleep). 04/25/15   Jimmy Footman, MD  naproxen (NAPROSYN) 500 MG tablet Take 1 tablet (500 mg total) by mouth 2 (two) times daily. 12/22/15   Cheri Fowler, PA-C  risperiDONE (RISPERDAL) 0.25 MG tablet Take 0.25 mg by mouth daily.    Historical Provider, MD  sertraline (ZOLOFT) 25 MG tablet Take 1 tablet (25 mg total) by mouth daily. 04/25/15   Jimmy Footman, MD  Family History Family History  Problem Relation Age of Onset  . Alcoholism Father     Social History Social History  Substance Use Topics  . Smoking status: Current Every Day Smoker    Packs/day: 1.00    Types: Cigarettes  . Smokeless tobacco: Former NeurosurgeonUser  . Alcohol use 0.0 oz/week     Allergies   Dilaudid [hydromorphone hcl]   Review of Systems Review of Systems  Constitutional: Negative for chills and fever.  Gastrointestinal: Negative for diarrhea, nausea and vomiting.  Skin:       +mass in the left breast  All other systems reviewed and are  negative.    Physical Exam Updated Vital Signs BP 113/66 (BP Location: Right Arm)   Pulse 75   Temp 98.4 F (36.9 C) (Oral)   Resp 16   Ht 5' 5.5" (1.664 m)   Wt 157 lb (71.2 kg)   LMP 02/08/2016   SpO2 97%   BMI 25.73 kg/m   Physical Exam  Constitutional: She is oriented to person, place, and time. She appears well-developed and well-nourished. No distress.  HENT:  Head: Normocephalic and atraumatic.  Eyes: EOM are normal. Pupils are equal, round, and reactive to light.  Neck: Normal range of motion. Neck supple.  Cardiovascular: Normal rate and regular rhythm.  Exam reveals no gallop and no friction rub.   No murmur heard. Pulmonary/Chest: Effort normal. She has no wheezes. She has no rales.  Abdominal: Soft. She exhibits no distension. There is no tenderness.  Musculoskeletal: She exhibits no edema or tenderness.  Neurological: She is alert and oriented to person, place, and time.  Skin: Skin is warm and dry. She is not diaphoretic.  Firm pea-sized nodular mass under the nipple of the left breast.  Psychiatric: She has a normal mood and affect. Her behavior is normal.  Nursing note and vitals reviewed.    ED Treatments / Results  DIAGNOSTIC STUDIES: Oxygen Saturation is 97% on RA, adequate by my interpretation.    COORDINATION OF CARE: 5:01 PM Discussed treatment plan with pt at bedside and pt agreed to plan. Will reassess.  Labs (all labs ordered are listed, but only abnormal results are displayed) Labs Reviewed - No data to display  EKG  EKG Interpretation None       Radiology No results found.  Procedures Procedures (including critical care time)  Medications Ordered in ED Medications - No data to display   Initial Impression / Assessment and Plan / ED Course  I have reviewed the triage vital signs and the nursing notes.  Pertinent labs & imaging results that were available during my care of the patient were reviewed by me and considered in my  medical decision making (see chart for details).    [facility].  Pt advised of need for imagining.  I suspect cyst but pt understands need for evaluation  Final Clinical Impressions(s) / ED Diagnoses   Final diagnoses:  Breast mass, left    New Prescriptions Discharge Medication List as of 03/10/2016  5:26 PM    An After Visit Summary was printed and given to the patient.  I personally performed the services in this documentation, which was scribed in my presence.  The recorded information has been reviewed and considered.   Barnet PallKaren SofiaPAC.    Lonia SkinnerLeslie K TeterboroSofia, PA-C 03/10/16 1947    Lavera Guiseana Duo Liu, MD 03/11/16 1116

## 2016-03-10 NOTE — ED Triage Notes (Signed)
Pt has hard, painful lump at top of left areola -- has had same in past -- 6 months ago, but states that it has grown and now has occasional drainage from breast-- yellowish white discharge.

## 2016-03-10 NOTE — ED Notes (Signed)
Patient currently changing into gown.

## 2016-08-02 ENCOUNTER — Emergency Department (HOSPITAL_COMMUNITY): Payer: Self-pay

## 2016-08-02 ENCOUNTER — Emergency Department
Admission: EM | Admit: 2016-08-02 | Discharge: 2016-08-02 | Discharge status: Against Medical Advice | Payer: Medicaid Other

## 2016-08-02 ENCOUNTER — Encounter (HOSPITAL_COMMUNITY): Payer: Self-pay | Admitting: Emergency Medicine

## 2016-08-02 ENCOUNTER — Emergency Department (HOSPITAL_COMMUNITY)
Admission: EM | Admit: 2016-08-02 | Discharge: 2016-08-03 | Disposition: A | Discharge status: Home / Self Care | Payer: Self-pay | Attending: Emergency Medicine | Admitting: Emergency Medicine

## 2016-08-02 DIAGNOSIS — Z79899 Other long term (current) drug therapy: Secondary | ICD-10-CM | POA: Insufficient documentation

## 2016-08-02 DIAGNOSIS — J029 Acute pharyngitis, unspecified: Secondary | ICD-10-CM | POA: Insufficient documentation

## 2016-08-02 DIAGNOSIS — F17210 Nicotine dependence, cigarettes, uncomplicated: Secondary | ICD-10-CM | POA: Insufficient documentation

## 2016-08-02 DIAGNOSIS — J189 Pneumonia, unspecified organism: Secondary | ICD-10-CM | POA: Insufficient documentation

## 2016-08-02 DIAGNOSIS — Z885 Allergy status to narcotic agent status: Secondary | ICD-10-CM | POA: Insufficient documentation

## 2016-08-02 MED ORDER — ALBUTEROL SULFATE (2.5 MG/3ML) 0.083% IN NEBU
INHALATION_SOLUTION | RESPIRATORY_TRACT | Status: AC
  Filled 2016-08-02: qty 6

## 2016-08-02 MED ORDER — ALBUTEROL SULFATE (2.5 MG/3ML) 0.083% IN NEBU
5.0000 mg | INHALATION_SOLUTION | Freq: Once | RESPIRATORY_TRACT | Status: AC
  Administered 2016-08-02: 5 mg via RESPIRATORY_TRACT

## 2016-08-02 MED ORDER — DOXYCYCLINE HYCLATE 100 MG PO CAPS: 100.0000 mg | ORAL_CAPSULE | Freq: Two times a day (BID) | ORAL | 0 refills | Status: AC

## 2016-08-02 MED ORDER — BENZONATATE 100 MG PO CAPS: 100.0000 mg | ORAL_CAPSULE | Freq: Three times a day (TID) | ORAL | 0 refills | Status: DC

## 2016-08-02 NOTE — ED Notes (Signed)
Pt verbalized understanding of d/c instructions and has no further questions. Pt is stable, A&Ox4, VSS.  

## 2016-08-02 NOTE — Discharge Instructions (Signed)
Take doxycycline twice daily for 7 days. Take Tessalon Perles as needed for cough. Return to ED for worsening cough, coughing up blood, severe chest pain, shortness of breath, no improvement in symptoms.

## 2016-08-02 NOTE — ED Notes (Signed)
Called x 2 in [place], no answer 

## 2016-08-02 NOTE — ED Provider Notes (Signed)
[facility]-EMERGENCY DEPT Provider Note   CSN: [id number] Arrival date & time: 08/02/16  2101  By signing my name below, I, Deland Pretty, attest that this documentation has been prepared under the direction and in the presence of Orchid Glassberg, PA-C. Electronically Signed: Deland Pretty, ED Scribe. 08/02/16. 11:37 PM.  History   Chief Complaint Chief Complaint  Patient presents with  . Cough  . Sore Throat   The history is provided by the patient. No language interpreter was used.   HPI Comments: Kristen Schneider is a 26 y.o. female who presents to the Emergency Department complaining of gradually worsening, moderate productive cough (green) with associated congestion, sore throat, sinus pressure that began a week ago. The pt also states that she had one fever that was 100.14F When symptoms began. She reports that she tried NyQuil with inadequate relief. She also reports that a possible positive sick contact who has pneumonia. The pt denies a h/x of asthma. She also denies chest pain, hemoptysis, leg swelling trouble breathing, ear pain, abdominal pain, nausea, vomiting and rash.   Past Medical History:  Diagnosis Date  . Abnormal menstrual periods   . Anxiety   . Polysubstance abuse   . Shingles 2006  . Torn rotator cuff left shoulder    Patient Active Problem List   Diagnosis Date Noted  . New onset seizure (HCC) 08/15/2015  . Hypokalemia   . Polysubstance abuse   . 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 Vitamin B12 deficiency 04/25/2015  . Alcohol use disorder, severe, dependence (HCC) 04/23/2015  . Cannabis use disorder, moderate, dependence (HCC) 04/23/2015  . Cocaine use disorder, moderate, dependence (HCC) 04/23/2015  . Opioid use disorder, moderate, dependence (HCC) 04/23/2015  . 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 MDD (major depressive disorder) 04/22/2015  . Tobacco use disorder 05/30/2014     Past Surgical History:  Procedure Laterality Date  . APPENDECTOMY    . LAPAROSCOPIC APPENDECTOMY N/A 05/23/2014   Procedure: APPENDECTOMY LAPAROSCOPIC;  Surgeon: Harriette Bouillon, MD;  Location: [facility] OR;  Service: General;  Laterality: N/A;  . LAPAROSCOPIC LYSIS OF ADHESIONS N/A 05/23/2014   Procedure: LAPAROSCOPIC LYSIS OF ADHESIONS;  Surgeon: Harriette Bouillon, MD;  Location: [facility] OR;  Service: General;  Laterality: N/A;  . POLYPECTOMY     As a child multiple colonoscopy's for polyp removal  . TONSILLECTOMY     26 y/o   . WISDOM TOOTH EXTRACTION     26 y/o     OB History    No data available       Home Medications    Prior to Admission medications   Medication Sig Start Date End Date Taking? Authorizing Provider  azithromycin (ZITHROMAX) 250 MG tablet Take 1 tablet (250 mg total) by mouth daily. Take first 2 tablets together, then 1 every day until finished. 01/03/16   Janne Napoleon, NP  benzonatate (TESSALON) 100 MG capsule Take 1 capsule (100 mg total) by mouth every 8 (eight) hours. 08/02/16   Branston Halsted, PA-C  doxycycline (VIBRAMYCIN) 100 MG capsule Take 1 capsule (100 mg total) by mouth 2 (two) times daily. 08/02/16 08/09/16  Savas Elvin, PA-C  guaiFENesin-codeine 100-10 MG/5ML syrup Take 5 mLs by mouth 3 (three) times daily as needed for cough. 01/03/16   Janne Napoleon, NP  hydrOXYzine (ATARAX/VISTARIL) 25 MG tablet Take 1 tablet (25 mg total) by mouth 3 (three) times daily as needed for anxiety (sleep). 04/25/15   Hernandez-Gonzalez,  Sue LushAndrea, MD  naproxen (NAPROSYN) 500 MG tablet Take 1 tablet (500 mg total) by mouth 2 (two) times daily. 12/22/15   Cheri Fowlerose, Kayla, PA-C  risperiDONE (RISPERDAL) 0.25 MG tablet Take 0.25 mg by mouth daily.    [provider]  sertraline (ZOLOFT) 25 MG tablet Take 1 tablet (25 mg total) by mouth daily. 04/25/15   Jimmy FootmanHernandez-Gonzalez, Andrea, MD    Family History Family History  Problem Relation Age of Onset  . Alcoholism Father     Social  History Social History  Substance Use Topics  . Smoking status: Current Every Day Smoker    Packs/day: 1.00    Types: Cigarettes  . Smokeless tobacco: Former NeurosurgeonUser  . Alcohol use 0.0 oz/week     Allergies   Dilaudid [hydromorphone hcl]   Review of Systems Review of Systems  Constitutional: Positive for fever. Negative for appetite change.  HENT: Positive for congestion, rhinorrhea, sinus pressure and sore throat. Negative for ear pain, trouble swallowing and voice change.   Respiratory: Positive for cough. Negative for shortness of breath.   Gastrointestinal: Negative for abdominal pain, nausea and vomiting.  Skin: Negative for rash.     Physical Exam Updated Vital Signs BP (!) 91/54 (BP Location: Right Arm)   Pulse 85   Temp 98.7 F (37.1 C) (Oral)   Resp 16   Ht 5\' 5"  (1.651 m)   Wt 72.6 kg (160 lb)   LMP 07/31/2016   SpO2 100%   BMI 26.63 kg/m   Physical Exam  Constitutional: She appears well-developed and well-nourished. No distress.  HENT:  Head: Normocephalic and atraumatic.  Right Ear: Tympanic membrane normal.  Left Ear: Tympanic membrane normal.  Nose: Mucosal edema present. Right sinus exhibits maxillary sinus tenderness. Left sinus exhibits maxillary sinus tenderness.  Mouth/Throat: Uvula is midline, oropharynx is clear and moist and mucous membranes are normal.  Patient does not appear to be in acute distress. No trismus or drooling present. No pooling of secretions. Patient is tolerating secretions and his nonrespiratory distress. No neck pain or tenderness to palpation of the neck. Full active and passive range of motion of the neck. No evidence of RPA or PTA.   Eyes: Conjunctivae and EOM are normal. Left eye exhibits no discharge. No scleral icterus.  Neck: Normal range of motion. Neck supple.  Cardiovascular: Normal rate, regular rhythm, normal heart sounds and intact distal pulses.  Exam reveals no gallop and no friction rub.   No murmur  heard. Pulmonary/Chest: Effort normal and breath sounds normal. No respiratory distress.  Abdominal: Soft. Bowel sounds are normal. She exhibits no distension. There is no tenderness. There is no guarding.  Musculoskeletal: Normal range of motion. She exhibits no edema.  No lower extremity edema.  Neurological: She is alert. She exhibits normal muscle tone. Coordination normal.  Skin: Skin is warm and dry. No rash noted.  Psychiatric: She has a normal mood and affect.  Nursing note and vitals reviewed.    ED Treatments / Results   DIAGNOSTIC STUDIES: Oxygen Saturation is 100 % on room air, normal by my interpretation.   COORDINATION OF CARE: 11:30 PM-Discussed next steps with pt. Pt verbalized understanding and is agreeable with the plan.   Labs (all labs ordered are listed, but only abnormal results are displayed) Labs Reviewed - No data to display  EKG  EKG Interpretation None       Radiology Dg Chest 2 View  Result Date: 08/02/2016 CLINICAL DATA:  26 year old female with shortness of  breath. EXAM: CHEST  2 VIEW COMPARISON:  Chest radiograph dated 01/03/2016 FINDINGS: There is a focal area of slight increased density involving the lingula which may be chronic changes or represent developing infiltrate. Clinical correlation is recommended. There is no pleural effusion or pneumothorax. The cardiac silhouette is within normal limits. No acute osseous pathology. IMPRESSION: Small focal density in the lingula may be chronic or represent developing infiltrate. Clinical correlation is recommended. Electronically Signed   By: Elgie Collard M.D.   On: 08/02/2016 22:25    Procedures Procedures (including critical care time)  Medications Ordered in ED Medications  albuterol (PROVENTIL) (2.5 MG/3ML) 0.083% nebulizer solution (not administered)  albuterol (PROVENTIL) (2.5 MG/3ML) 0.083% nebulizer solution 5 mg (5 mg Nebulization Given 08/02/16 2156)     Initial Impression /  Assessment and Plan / ED Course  I have reviewed the triage vital signs and the nursing notes.  Pertinent labs & imaging results that were available during my care of the patient were reviewed by me and considered in my medical decision making (see chart for details).     Patient presents to ED for cough productive of green sputum for the past week. She also complains of sore throat and fever. She reports history of positive sick contact with a friend with pneumonia. She denies any palpitations, hemoptysis, leg swelling, history of DVT or PE. On physical exam her lungs are clear to auscultation bilaterally. She is satting at 100% with heart rate of 85. She is afebrile at this time. No leg swelling noted. Low suspicion for DVT and patient is PERC negative. Her HEENT exam showed mucosal edema but low concern for strep throat. Tonsils normal bilaterally. No tonsillar exudate noted. Patient is handling secretions, with no trismus, no voice change or neck pain noted. Low suspicion for RPA or PTA. X-ray showed infiltrate concerning for pneumonia. We'll treat for community-acquired pneumonia as patient states that she has not received antibiotics in the past 3 months. We'll treat with doxycycline and given Tessalon Perles to be taken as needed. I advised patient to take ibuprofen or Tylenol as needed for pain or fevers. Patient appears stable for discharge at this time. Strict return precautions given.  Final Clinical Impressions(s) / ED Diagnoses   Final diagnoses:  Community acquired pneumonia, unspecified laterality    New Prescriptions Discharge Medication List as of 08/02/2016 11:47 PM    START taking these medications   Details  benzonatate (TESSALON) 100 MG capsule Take 1 capsule (100 mg total) by mouth every 8 (eight) hours., Starting Mon 08/02/2016, Print    doxycycline (VIBRAMYCIN) 100 MG capsule Take 1 capsule (100 mg total) by mouth 2 (two) times daily., Starting Mon 08/02/2016, Until Mon  08/09/2016, Print       I personally performed the services described in this documentation, which was scribed in my presence. The recorded information has been reviewed and is accurate.     Dietrich Pates, PA-C 08/03/16 Virginia Rochester, MD 08/06/16 [phone]

## 2016-08-02 NOTE — ED Triage Notes (Signed)
Pt states she is having persistent cough for the past few days taking over the counter medication with not relief.

## 2016-08-18 ENCOUNTER — Encounter: Payer: Self-pay | Admitting: *Deleted

## 2016-08-18 ENCOUNTER — Ambulatory Visit: Payer: Self-pay | Attending: Oncology | Admitting: *Deleted

## 2016-08-18 VITALS — BP 97/58 | HR 69 | Temp 98.0°F | Ht 65.0 in | Wt 163.0 lb

## 2016-08-18 DIAGNOSIS — N63 Unspecified lump in unspecified breast: Secondary | ICD-10-CM

## 2016-08-18 NOTE — Patient Instructions (Signed)
Gave patient hand-out, Women Staying Healthy, Active and Well from [facility], with education on breast health, pap smears, heart and colon health. 

## 2016-08-18 NOTE — Progress Notes (Signed)
Subjective:     Patient ID: Kristen Schneider, female   DOB: Dec 16, 1990, 26 y.o.   MRN: [id number]  HPI   Review of Systems     Objective:   Physical Exam  Pulmonary/Chest: Right breast exhibits no inverted nipple, no mass, no nipple discharge, no skin change and no tenderness. Left breast exhibits mass and tenderness. Breasts are symmetrical.         Assessment:     26 [facility] White [facility].  Patient states she has presented to the ED twice in the last 8 or 9 months for the left breast mass and breast pain.  States she did not follow-up with the breast center because she did not have any insurance. Also states the lump has changed in size over those months, waxing and waning.  On clinical breast exam, I can visually see a light red area at 12:00 left breast.  On clinical breast exam there is no dominant mass, but there is a 2 cm thickening in lthe eft breast areola at 12:00.  Patient states she has a family history of breast cancer, but does not know who they were.  States someone on her mom's side, but it was not an aunt or grandmother.  Taught self breast awareness.  Patient has been screened for eligibility.  She does not have any insurance, Medicare or Medicaid.  She also meets financial eligibility.  Hand-out given on the Affordable Care Act.    Plan:     Since the patient is 26, and has an area of tenderness and redness,  I will refer her on for surgical consultation.  Joellyn Quailshristy Burton to schedule patient to see Dr. Lemar LivingsByrnett.  Will follow-up per [facility] protocol.

## 2016-08-31 ENCOUNTER — Ambulatory Visit (INDEPENDENT_AMBULATORY_CARE_PROVIDER_SITE_OTHER): Payer: PRIVATE HEALTH INSURANCE | Admitting: General Surgery

## 2016-08-31 ENCOUNTER — Encounter: Payer: Self-pay | Admitting: General Surgery

## 2016-08-31 ENCOUNTER — Other Ambulatory Visit: Payer: Self-pay | Admitting: General Surgery

## 2016-08-31 ENCOUNTER — Inpatient Hospital Stay: Payer: Self-pay

## 2016-08-31 VITALS — BP 104/60 | HR 74 | Resp 14 | Ht 65.5 in | Wt 172.0 lb

## 2016-08-31 DIAGNOSIS — N611 Abscess of the breast and nipple: Secondary | ICD-10-CM

## 2016-08-31 MED ORDER — HYDROCODONE-ACETAMINOPHEN 5-325 MG PO TABS: 1.0000 | ORAL_TABLET | Freq: Four times a day (QID) | ORAL | 0 refills | Status: DC | PRN

## 2016-08-31 MED ORDER — SULFAMETHOXAZOLE-TRIMETHOPRIM 800-160 MG PO TABS: 1.0000 | ORAL_TABLET | Freq: Two times a day (BID) | ORAL | 0 refills | Status: AC

## 2016-08-31 NOTE — Patient Instructions (Addendum)
The patient is aware to call back for any questions or concerns. Wear a bra for support Recommend eating yogurt while on antibiotics.

## 2016-08-31 NOTE — Progress Notes (Signed)
Patient ID: Kristen Schneider, female   DOB: [birth year], 26 y.o.   MRN: [id number]  Chief Complaint  Patient presents with  . Breast Problem    HPI Kristen Schneider is a 26 y.o. female.  Here today for a breast evaluation referred by Molli PoseySheena Lambert RN, [facility]. She states she noticed a left breast knot about 8 months ago. She states it is painful and has gotten larger. It went from M & M size to 50 cent piece. She states the nipple discharge is white in color and only with stimulation (at old nipple piercing site).  She states the area does get red and swollen. She states it does seem to be a little better over the past 2 days. Denies injury or trauma. Pneumonia August 02 2016, treated with doxycycline.  HPI  Past Medical History:  Diagnosis Date  . Abnormal menstrual periods   . Anxiety   . Polysubstance abuse   . Shingles 2006  . Torn rotator cuff left shoulder    Past Surgical History:  Procedure Laterality Date  . APPENDECTOMY    . LAPAROSCOPIC APPENDECTOMY N/A 05/23/2014   Procedure: APPENDECTOMY LAPAROSCOPIC;  Surgeon: Harriette Bouillonhomas Cornett, MD;  [facility];  Service: General;  Laterality: N/A;  . LAPAROSCOPIC LYSIS OF ADHESIONS N/A 05/23/2014   Procedure: LAPAROSCOPIC LYSIS OF ADHESIONS;  Surgeon: Harriette Bouillonhomas Cornett, MD;  Location: [facility] OR;  Service: General;  Laterality: N/A;  . POLYPECTOMY     As a child multiple colonoscopy's for polyp removal  . TONSILLECTOMY     26 y/o   . WISDOM TOOTH EXTRACTION     26 y/o     Family History  Problem Relation Age of Onset  . Alcoholism Father   . Colon cancer Neg Hx     Social History Social History  Substance Use Topics  . Smoking status: Current Every Day Smoker    Packs/day: 1.00    Types: Cigarettes  . Smokeless tobacco: Former NeurosurgeonUser  . Alcohol use 0.0 oz/week    Allergies  Allergen Reactions  . Seroquel [Quetiapine Fumarate] Other (See Comments)    restless leg  . Dilaudid [Hydromorphone Hcl] Rash    Rash and itching     Current Outpatient Prescriptions  Medication Sig Dispense Refill  . gabapentin (NEURONTIN) 100 MG capsule Take 100 mg by mouth 2 (two) times daily.    . hydrOXYzine (ATARAX/VISTARIL) 25 MG tablet Take 1 tablet (25 mg total) by mouth 3 (three) times daily as needed for anxiety (sleep). 30 tablet 0  . ibuprofen (ADVIL,MOTRIN) 800 MG tablet Take 800 mg by mouth every 8 (eight) hours as needed.    . risperiDONE (RISPERDAL) 0.25 MG tablet Take 0.25 mg by mouth daily.    Marland Kitchen. HYDROcodone-acetaminophen (NORCO/VICODIN) 5-325 MG tablet Take 1 tablet by mouth every 6 (six) hours as needed for moderate pain. 12 tablet 0  . sulfamethoxazole-trimethoprim (BACTRIM DS,SEPTRA DS) 800-160 MG tablet Take 1 tablet by mouth 2 (two) times daily. 20 tablet 0   No current facility-administered medications for this visit.     Review of Systems Review of Systems  Constitutional: Negative.   Respiratory: Negative.   Cardiovascular: Negative.     Blood pressure 104/60, pulse 74, resp. rate 14, height 5' 5.5" (1.664 m), weight 172 lb (78 kg), last menstrual period 08/31/2016.  Physical Exam Physical Exam  Constitutional: She is oriented to person, place, and time. She appears well-developed and well-nourished.  HENT:  Mouth/Throat: Oropharynx is clear and moist.  Eyes: Conjunctivae are normal. No scleral icterus.  Neck: Neck supple.  Cardiovascular: Normal rate, regular rhythm and normal heart sounds.   Pulmonary/Chest: Effort normal and breath sounds normal. Right breast exhibits no inverted nipple, no mass, no nipple discharge, no skin change and no tenderness. Left breast exhibits no inverted nipple, no mass, no nipple discharge, no skin change and no tenderness.    Abscess at 12 o'clock left breast  Lymphadenopathy:    She has no cervical adenopathy.    She has no axillary adenopathy.  Neurological: She is alert and oriented to person, place, and time.  Skin: Skin is warm and dry.  Psychiatric: Her  behavior is normal.    Data Reviewed Ultrasound examination of the left breast was completed with special attention the area of inflammation at the upper edge of the areola. There is a 0.5 x 2.5 x 2.5 cm hypoechoic area with marked thinning of the overlying skin in the area of erythema and tenderness consistent with an abscess. There is a suggestion of deep tracking in the radial images. BI-RADS-2.  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    Plan    The patient was amenable to incision and drainage. After skin preparation with alcohol 10 mL of 0.5% Xylocaine with 0.25% Marcaine with 1-200,000 epinephrine was instilled. This was well tolerated. ChloraPrep was applied to the skin. A 1 cm incision was made over the central area of fluctuance. Scant amount of purulent drainage, thickened subcutaneous fat noted. Probing suggests tracking towards the nipple. Bleeding was controlled with direct pressure. Dry dressing applied.  Limited prescription for Norco provided for immediate postoperative pain use.  Patient will be placed on Bactrim DS 1 by mouth twice a day pending culture results.    Wear a bra for support Recommend eating yogurt while on antibiotics.   HPI, Physical Exam, Assessment and Plan have been scribed under the direction and in the presence of Earline Mayotte, MD. Dorathy Daft, RN  I have completed the exam and reviewed the above documentation for accuracy and completeness.  I agree with the above.  Museum/gallery conservator has been used and any errors in dictation or transcription are unintentional.  Donnalee Curry, M.D., F.A.C.S.  Earline Mayotte 08/31/2016, 9:05 PM

## 2016-09-04 LAB — AEROBIC CULTURE

## 2016-09-06 ENCOUNTER — Ambulatory Visit: Payer: PRIVATE HEALTH INSURANCE | Admitting: General Surgery

## 2016-09-07 ENCOUNTER — Encounter: Payer: Self-pay | Admitting: *Deleted

## 2016-09-07 NOTE — Progress Notes (Signed)
Patient evaluated by Dr. Lemar LivingsByrnett and thought to have a breast abscess.  She was treated with antibiotics.  She will follow up with him.  [facility] to [place].

## 2016-09-08 ENCOUNTER — Telehealth: Payer: Self-pay

## 2016-09-08 NOTE — Telephone Encounter (Signed)
Spoke with patient about rescheduling a follow up appointment post breast excision. She states that she has no way to get to our office at this time. She reports that the surgical area has no drainage or redness. She will call to schedule a follow up once she is able. I let her know to call us if anything changed with her surgical site.

## 2016-09-08 NOTE — Telephone Encounter (Signed)
Message left for the patient to call the office back. Needs to reschedule follow appointment and also questions about surgeries/procedures as a child so we can get records.

## 2016-09-15 ENCOUNTER — Encounter: Payer: Self-pay | Admitting: *Deleted

## 2016-10-20 NOTE — ED Notes (Addendum)
Pt called from lobby with no response x2 

## 2016-10-20 NOTE — ED Notes (Signed)
Pt called from lobby with no response. 

## 2016-10-21 ENCOUNTER — Emergency Department (HOSPITAL_COMMUNITY): Admission: EM | Admit: 2016-10-21 | Discharge: 2016-10-21 | Discharge status: Against Medical Advice | Payer: Self-pay

## 2016-10-21 NOTE — ED Notes (Signed)
Called  No response from lobby 

## 2016-10-26 IMAGING — CT CT ABD-PELV W/ CM
2 of 4 series · 4 of 46 positions shown, 6 images · IV contrast (omnipaque)
Comparison: CT abdomen pelvis 05/23/2014

CLINICAL DATA: Appendectomy 7 days ago.  Abdominal pain

EXAM:
CT ABDOMEN AND PELVIS WITH CONTRAST
TECHNIQUE: Multidetector CT imaging of the abdomen and pelvis was performed
using the standard protocol following bolus administration of
intravenous contrast.
CONTRAST:  80mL OMNIPAQUE IOHEXOL 300 MG/ML  SOLN

[Series 204: cor · coronal · 0.50mm/px · 3 of 109 slices shown, 4 images]
[im 25/109  soft-tissue]
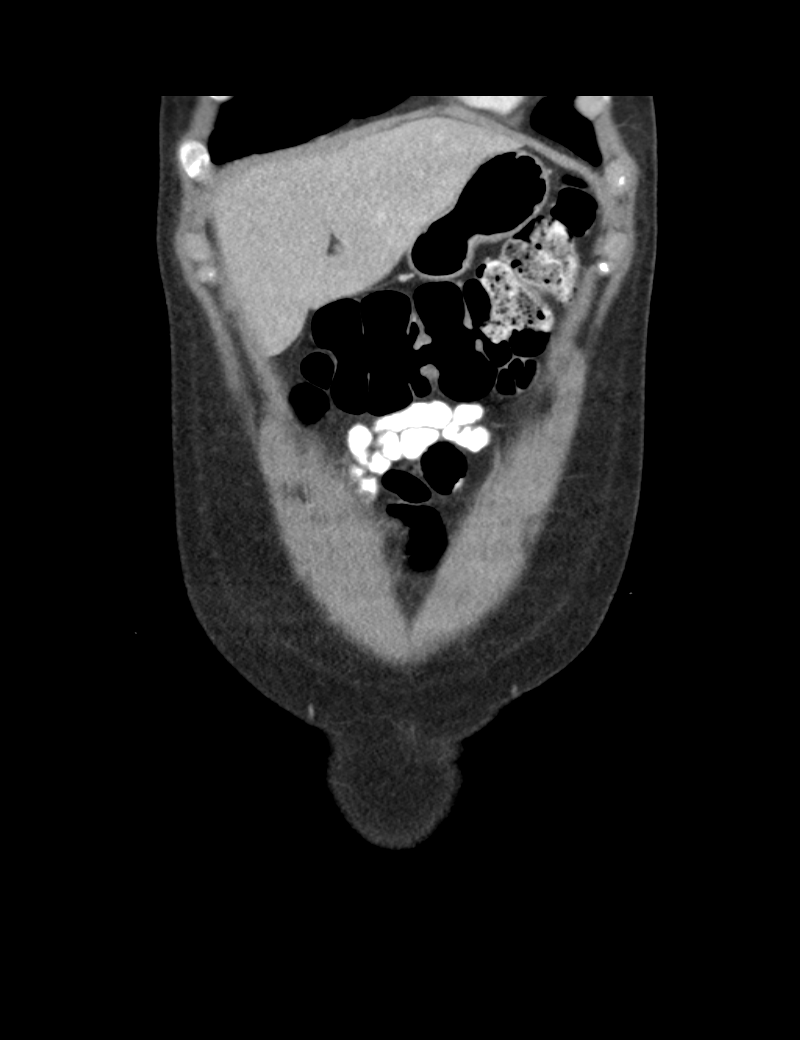
[im 25/109  bone]
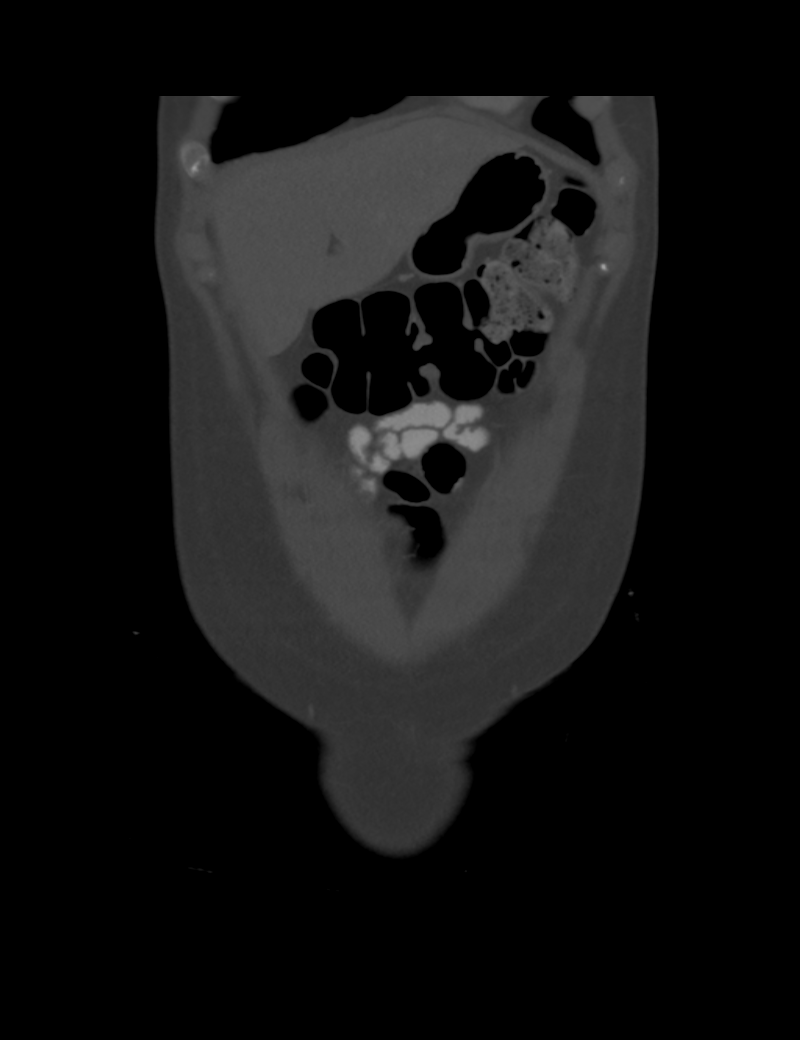
[im 61/109  soft-tissue]
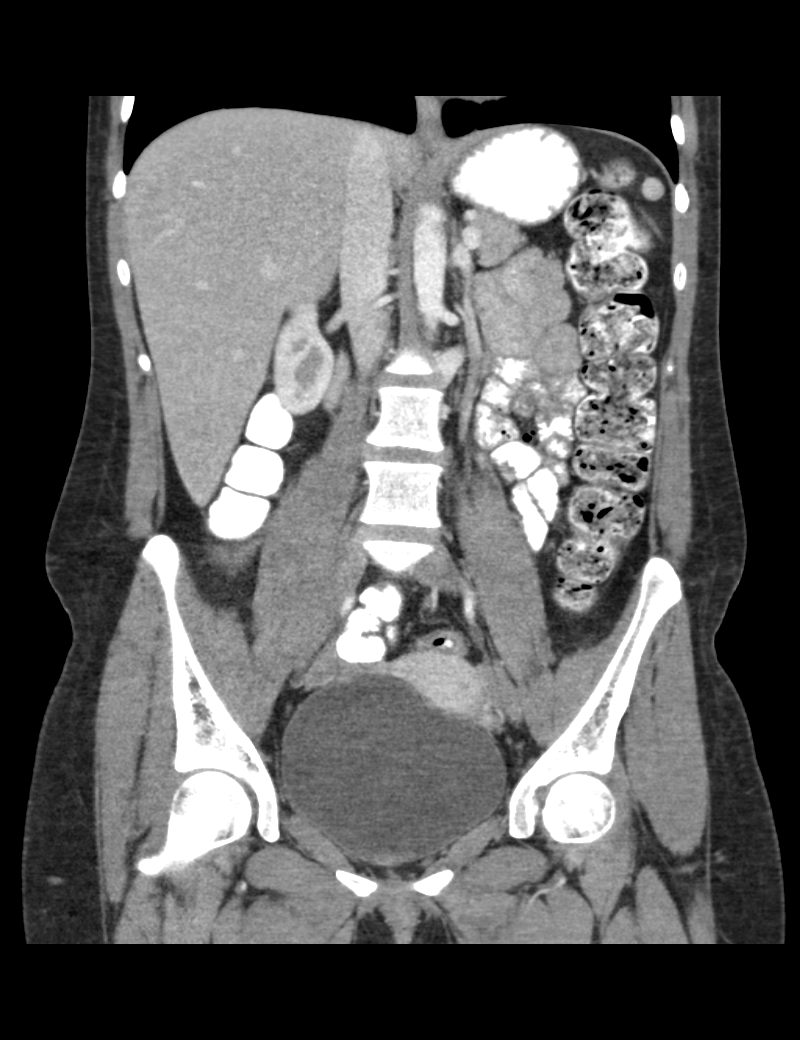
[im 85/109  soft-tissue]
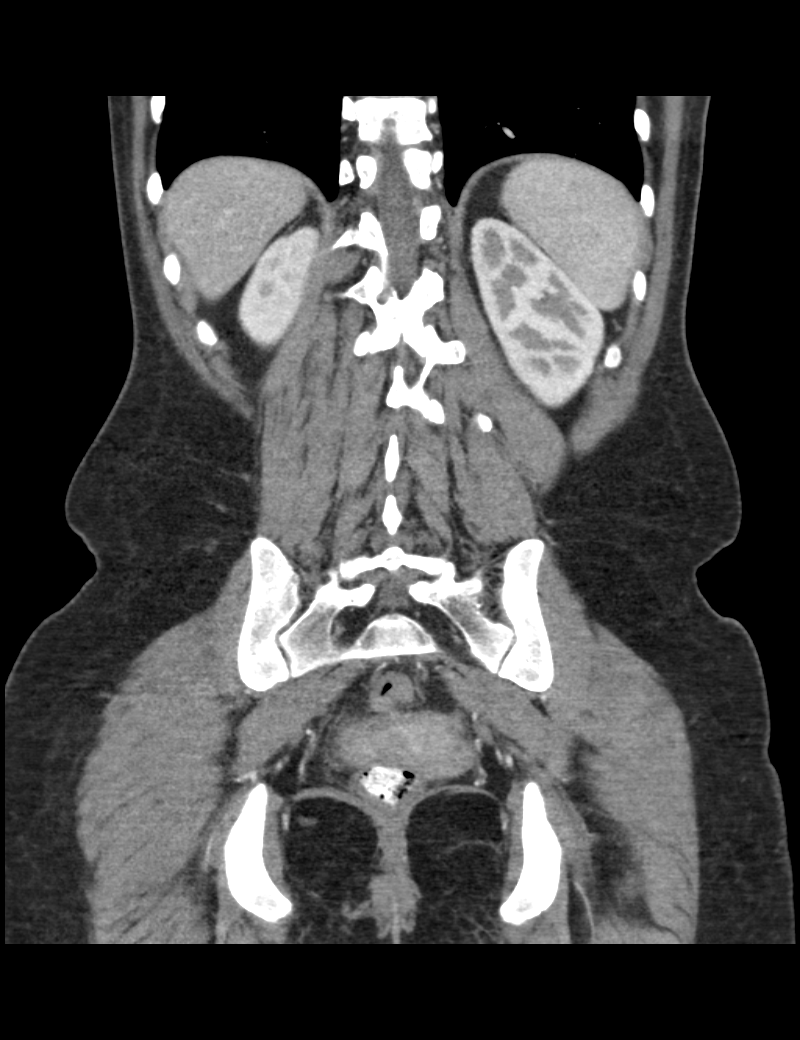

[Series 205: sag · sagittal · 0.50mm/px · 1 of 149 slices shown, 2 images]
[im 50/149  soft-tissue]
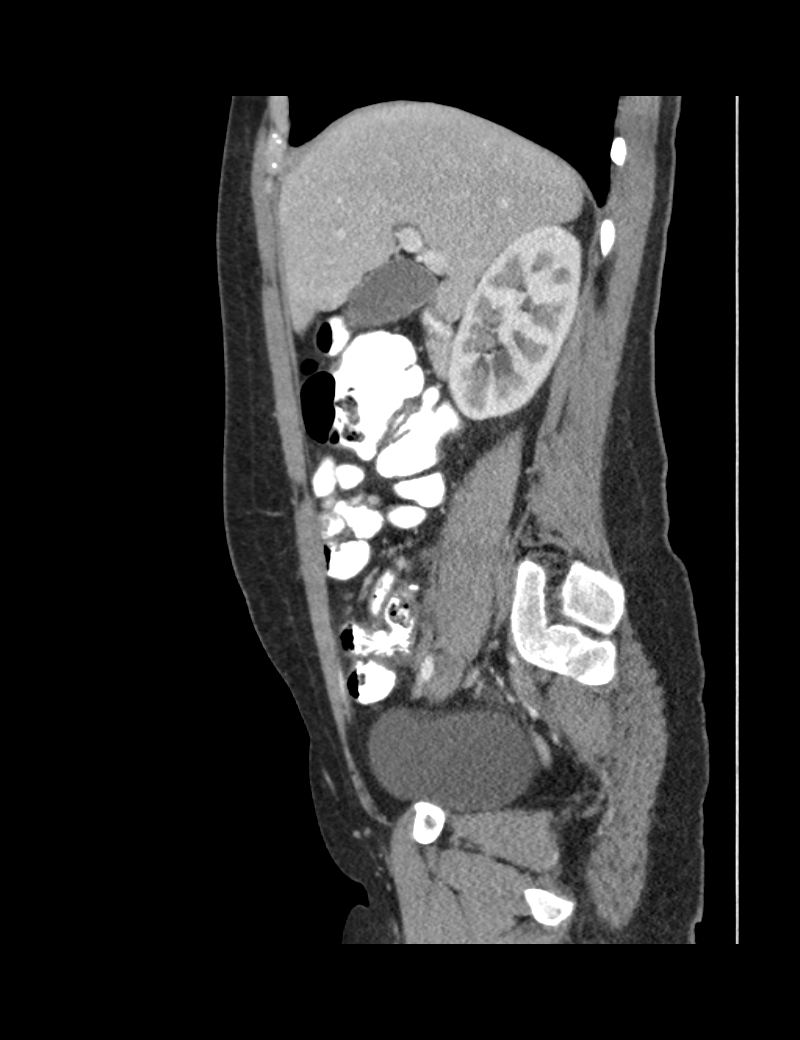
[im 50/149  bone]
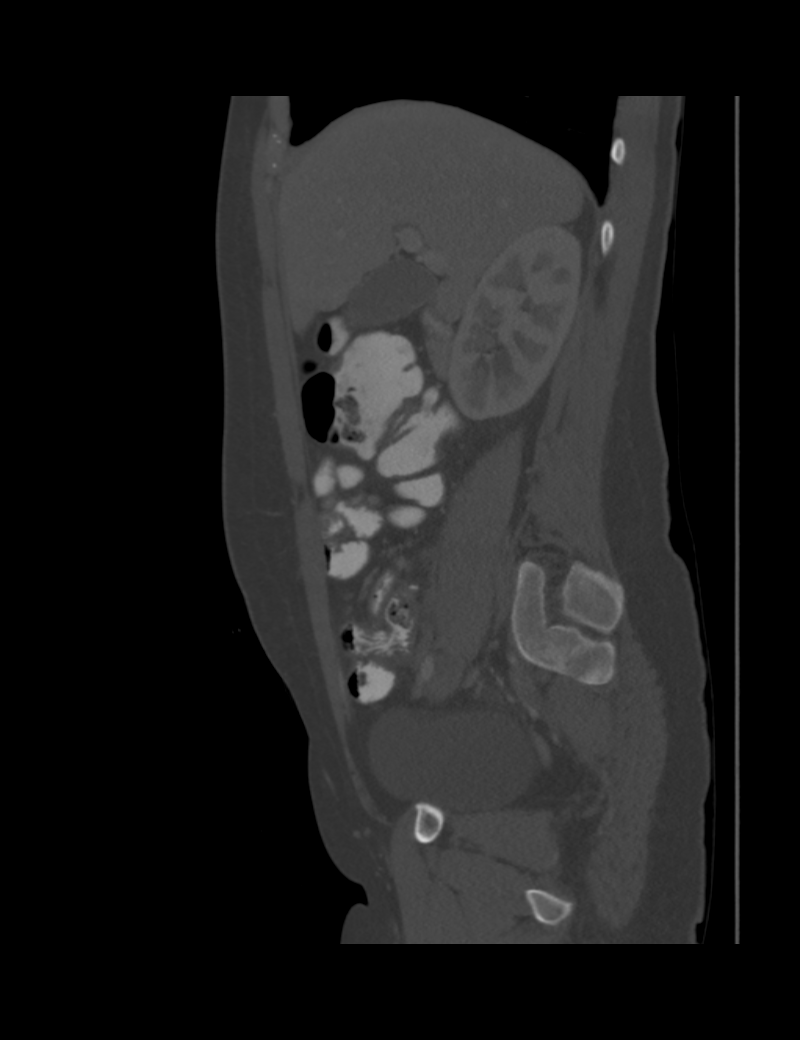

[4 of 46 positions shown; findings below may reference images not displayed]

FINDINGS: Lung bases clear.

Liver gallbladder and bile ducts are normal. Spleen is normal.
Pancreas is normal without mass or edema

Kidneys and adrenal glands are normal. No renal mass, obstruction,
or calculi. Urinary bladder normal.

Uterus is normal.  No adnexal mass.  No free fluid in the pelvis

Negative for bowel obstruction. No bowel edema. There is mild
thickening of the fascia posterior to the cecum on the right related
to recent appendectomy. No abscess or fluid collection identified.
No significant edema involving the terminal MR cecum.

Moderate stool throughout the colon

Negative lumbar spine
IMPRESSION: Mild postoperative edema in the right lower quadrant consistent with
recent appendectomy. No abscess or free fluid. No bowel edema.

Constipation.

## 2017-01-18 ENCOUNTER — Inpatient Hospital Stay (HOSPITAL_COMMUNITY)
Admission: EM | Admit: 2017-01-18 | Discharge: 2017-01-20 | DRG: 989 | Disposition: A | Discharge status: Home / Self Care | Payer: Self-pay | Attending: Internal Medicine | Admitting: Internal Medicine

## 2017-01-18 ENCOUNTER — Emergency Department (HOSPITAL_COMMUNITY): Payer: Self-pay

## 2017-01-18 ENCOUNTER — Other Ambulatory Visit: Payer: Self-pay

## 2017-01-18 ENCOUNTER — Emergency Department (HOSPITAL_COMMUNITY)
Admission: EM | Admit: 2017-01-18 | Discharge: 2017-01-18 | Discharge status: Against Medical Advice | Payer: Self-pay | Attending: Emergency Medicine | Admitting: Emergency Medicine

## 2017-01-18 ENCOUNTER — Encounter (HOSPITAL_COMMUNITY): Payer: Self-pay

## 2017-01-18 ENCOUNTER — Encounter (HOSPITAL_COMMUNITY): Payer: Self-pay | Admitting: Emergency Medicine

## 2017-01-18 DIAGNOSIS — S61452A Open bite of left hand, initial encounter: Secondary | ICD-10-CM | POA: Diagnosis present

## 2017-01-18 DIAGNOSIS — Z885 Allergy status to narcotic agent status: Secondary | ICD-10-CM

## 2017-01-18 DIAGNOSIS — F419 Anxiety disorder, unspecified: Secondary | ICD-10-CM | POA: Diagnosis present

## 2017-01-18 DIAGNOSIS — Z5321 Procedure and treatment not carried out due to patient leaving prior to being seen by health care provider: Secondary | ICD-10-CM | POA: Insufficient documentation

## 2017-01-18 DIAGNOSIS — Y999 Unspecified external cause status: Secondary | ICD-10-CM | POA: Insufficient documentation

## 2017-01-18 DIAGNOSIS — W540XXA Bitten by dog, initial encounter: Secondary | ICD-10-CM

## 2017-01-18 DIAGNOSIS — Z811 Family history of alcohol abuse and dependence: Secondary | ICD-10-CM

## 2017-01-18 DIAGNOSIS — S61402A Unspecified open wound of left hand, initial encounter: Secondary | ICD-10-CM

## 2017-01-18 DIAGNOSIS — Z23 Encounter for immunization: Secondary | ICD-10-CM

## 2017-01-18 DIAGNOSIS — E538 Deficiency of other specified B group vitamins: Secondary | ICD-10-CM | POA: Diagnosis present

## 2017-01-18 DIAGNOSIS — L03114 Cellulitis of left upper limb: Principal | ICD-10-CM | POA: Diagnosis present

## 2017-01-18 DIAGNOSIS — Y929 Unspecified place or not applicable: Secondary | ICD-10-CM | POA: Insufficient documentation

## 2017-01-18 DIAGNOSIS — F17210 Nicotine dependence, cigarettes, uncomplicated: Secondary | ICD-10-CM | POA: Diagnosis present

## 2017-01-18 DIAGNOSIS — Y9301 Activity, walking, marching and hiking: Secondary | ICD-10-CM | POA: Insufficient documentation

## 2017-01-18 DIAGNOSIS — E876 Hypokalemia: Secondary | ICD-10-CM | POA: Diagnosis present

## 2017-01-18 DIAGNOSIS — Z888 Allergy status to other drugs, medicaments and biological substances status: Secondary | ICD-10-CM

## 2017-01-18 LAB — CBC WITH DIFFERENTIAL/PLATELET
Basophils Absolute: 0 10*3/uL (ref 0.0–0.1)
Basophils Relative: 0 %
Eosinophils Absolute: 0 10*3/uL (ref 0.0–0.7)
Eosinophils Relative: 0 %
HCT: 42.9 % (ref 36.0–46.0)
Hemoglobin: 14.8 g/dL (ref 12.0–15.0)
Lymphocytes Relative: 22 %
Lymphs Abs: 2.4 10*3/uL (ref 0.7–4.0)
MCH: 33 pg (ref 26.0–34.0)
MCHC: 34.5 g/dL (ref 30.0–36.0)
MCV: 95.5 fL (ref 78.0–100.0)
Monocytes Absolute: 0.7 10*3/uL (ref 0.1–1.0)
Monocytes Relative: 7 %
Neutro Abs: 7.8 10*3/uL — ABNORMAL HIGH (ref 1.7–7.7)
Neutrophils Relative %: 71 %
Platelets: 264 10*3/uL (ref 150–400)
RBC: 4.49 MIL/uL (ref 3.87–5.11)
RDW: 13.4 % (ref 11.5–15.5)
WBC: 10.9 10*3/uL — ABNORMAL HIGH (ref 4.0–10.5)

## 2017-01-18 LAB — BASIC METABOLIC PANEL
Anion gap: 15 (ref 5–15)
BUN: 12 mg/dL (ref 6–20)
CO2: 19 mmol/L — ABNORMAL LOW (ref 22–32)
Calcium: 8.6 mg/dL — ABNORMAL LOW (ref 8.9–10.3)
Chloride: 102 mmol/L (ref 101–111)
Creatinine, Ser: 0.74 mg/dL (ref 0.44–1.00)
GFR calc Af Amer: >60 mL/min (ref >60)
GFR calc non Af Amer: >60 mL/min (ref >60)
Glucose, Bld: 105 mg/dL — ABNORMAL HIGH (ref 65–99)
Potassium: 3.4 mmol/L — ABNORMAL LOW (ref 3.5–5.1)
Sodium: 136 mmol/L (ref 135–145)

## 2017-01-18 LAB — SURGICAL PCR SCREEN
MRSA, PCR: NEGATIVE
STAPHYLOCOCCUS AUREUS: NEGATIVE

## 2017-01-18 MED ORDER — HYDROXYZINE HCL 25 MG PO TABS
25.0000 mg | ORAL_TABLET | Freq: Three times a day (TID) | ORAL | Status: DC | PRN
  Administered 2017-01-20: 25 mg via ORAL
  Filled 2017-01-18: qty 1

## 2017-01-18 MED ORDER — POTASSIUM CHLORIDE CRYS ER 20 MEQ PO TBCR
40.0000 meq | EXTENDED_RELEASE_TABLET | Freq: Once | ORAL | Status: AC
  Administered 2017-01-19: 40 meq via ORAL
  Filled 2017-01-18: qty 2

## 2017-01-18 MED ORDER — VANCOMYCIN HCL IN DEXTROSE 1-5 GM/200ML-% IV SOLN
1000.0000 mg | Freq: Three times a day (TID) | INTRAVENOUS | Status: DC
  Administered 2017-01-19 – 2017-01-20 (×5): 1000 mg via INTRAVENOUS
  Filled 2017-01-18 (×6): qty 200

## 2017-01-18 MED ORDER — ACETAMINOPHEN 650 MG RE SUPP
650.0000 mg | Freq: Four times a day (QID) | RECTAL | Status: DC | PRN
  Administered 2017-01-19: 650 mg via RECTAL

## 2017-01-18 MED ORDER — FENTANYL CITRATE (PF) 100 MCG/2ML IJ SOLN
50.0000 ug | Freq: Once | INTRAMUSCULAR | Status: AC
  Administered 2017-01-18: 50 ug via INTRAVENOUS
  Filled 2017-01-18: qty 2

## 2017-01-18 MED ORDER — VANCOMYCIN HCL 10 G IV SOLR
1500.0000 mg | Freq: Once | INTRAVENOUS | Status: AC
  Administered 2017-01-18: 1500 mg via INTRAVENOUS
  Filled 2017-01-18: qty 1500

## 2017-01-18 MED ORDER — GABAPENTIN 100 MG PO CAPS
100.0000 mg | ORAL_CAPSULE | Freq: Two times a day (BID) | ORAL | Status: DC
  Administered 2017-01-19 – 2017-01-20 (×4): 100 mg via ORAL
  Filled 2017-01-18 (×4): qty 1

## 2017-01-18 MED ORDER — OXYCODONE HCL 5 MG PO TABS
5.0000 mg | ORAL_TABLET | ORAL | Status: DC | PRN
  Administered 2017-01-19 (×5): 5 mg via ORAL
  Filled 2017-01-18 (×5): qty 1

## 2017-01-18 MED ORDER — SODIUM CHLORIDE 0.9 % IV SOLN
1.5000 g | Freq: Once | INTRAVENOUS | Status: AC
  Administered 2017-01-18: 1.5 g via INTRAVENOUS
  Filled 2017-01-18: qty 1.5

## 2017-01-18 MED ORDER — TETANUS-DIPHTH-ACELL PERTUSSIS 5-2.5-18.5 LF-MCG/0.5 IM SUSP
0.5000 mL | Freq: Once | INTRAMUSCULAR | Status: AC
  Administered 2017-01-18: 0.5 mL via INTRAMUSCULAR
  Filled 2017-01-18: qty 0.5

## 2017-01-18 MED ORDER — SODIUM CHLORIDE 0.9 % IV SOLN
INTRAVENOUS | Status: DC
  Administered 2017-01-18: 20:00:00 via INTRAVENOUS

## 2017-01-18 MED ORDER — MORPHINE SULFATE (PF) 4 MG/ML IV SOLN
4.0000 mg | Freq: Once | INTRAVENOUS | Status: AC
  Administered 2017-01-18: 4 mg via INTRAVENOUS
  Filled 2017-01-18: qty 1

## 2017-01-18 MED ORDER — SODIUM CHLORIDE 0.9 % IV SOLN
1.5000 g | Freq: Four times a day (QID) | INTRAVENOUS | Status: DC
  Administered 2017-01-19 – 2017-01-20 (×5): 1.5 g via INTRAVENOUS
  Filled 2017-01-18 (×9): qty 1.5

## 2017-01-18 MED ORDER — KETOROLAC TROMETHAMINE 15 MG/ML IJ SOLN
15.0000 mg | Freq: Four times a day (QID) | INTRAMUSCULAR | Status: DC | PRN
  Administered 2017-01-18 – 2017-01-20 (×6): 15 mg via INTRAVENOUS
  Filled 2017-01-18 (×6): qty 1

## 2017-01-18 MED ORDER — ACETAMINOPHEN 325 MG PO TABS
650.0000 mg | ORAL_TABLET | Freq: Four times a day (QID) | ORAL | Status: DC | PRN
  Administered 2017-01-19 – 2017-01-20 (×3): 650 mg via ORAL
  Filled 2017-01-18 (×4): qty 2

## 2017-01-18 MED ORDER — ONDANSETRON HCL 4 MG/2ML IJ SOLN: 4.0000 mg | Freq: Four times a day (QID) | INTRAMUSCULAR | Status: DC | PRN

## 2017-01-18 MED ORDER — ALBUTEROL SULFATE (2.5 MG/3ML) 0.083% IN NEBU: 2.5000 mg | INHALATION_SOLUTION | RESPIRATORY_TRACT | Status: DC | PRN

## 2017-01-18 MED ORDER — NICOTINE 14 MG/24HR TD PT24
14.0000 mg | MEDICATED_PATCH | Freq: Every day | TRANSDERMAL | Status: DC
  Administered 2017-01-19 (×2): 14 mg via TRANSDERMAL
  Filled 2017-01-18 (×3): qty 1

## 2017-01-18 MED ORDER — ONDANSETRON HCL 4 MG PO TABS: 4.0000 mg | ORAL_TABLET | Freq: Four times a day (QID) | ORAL | Status: DC | PRN

## 2017-01-18 NOTE — Progress Notes (Addendum)
Addendum: Labs back, CrCl>100. Will add maintenance vancomycin dose.   Pharmacy Antibiotic Note  Darral DashJessica K Darsey is a 26 y.o. female admitted on 01/18/2017 with wound infection.  Pharmacy has been consulted for vancomycin dosing.  Plan: Vancomycin 1500 mg IV x 1, followed by 1000 mg IV q 8 hours Monitor clinical progress, cultures/sensitivities, renal function, abx plan Vancomycin trough as indicated   Height: 5' 5.5" (166.4 cm) Weight: 175 lb (79.4 kg) IBW/kg (Calculated) : 58.15  Temp (24hrs), Avg:97.7 F (36.5 C), Min:97.6 F (36.4 C), Max:97.8 F (36.6 C)  No results for input(s): WBC, CREATININE, LATICACIDVEN, VANCOTROUGH, VANCOPEAK, VANCORANDOM, GENTTROUGH, GENTPEAK, GENTRANDOM, TOBRATROUGH, TOBRAPEAK, TOBRARND, AMIKACINPEAK, AMIKACINTROU, AMIKACIN in the last 168 hours.  CrCl cannot be calculated (Patient's most recent lab result is older than the maximum 21 days allowed.).    Allergies  Allergen Reactions  . Seroquel [Quetiapine Fumarate] Other (See Comments)    restless leg  . Dilaudid [Hydromorphone Hcl] Rash    Rash and itching    Antimicrobials this admission: 12/25 vancomycin >>  12/25 Unasyn >>    Microbiology results: none  Thank you for allowing us to participate in this patients care.  Signe Coltonya C Jock Mahon, PharmD Clinical phone for 01/18/2017 from 7a-3:30p: [phone] If after 3:30p, please call main pharmacy at: [phone] 01/18/2017 3:48 PM

## 2017-01-18 NOTE — ED Notes (Signed)
Provider at the bedside.  

## 2017-01-18 NOTE — ED Notes (Signed)
Admitting Md at the bedside 

## 2017-01-18 NOTE — ED Triage Notes (Signed)
Pt bit by "muttt" when walking to store. Left hand laceration noted. Unclear if dog was known to pt. Or visitor and when mentioned regarding rabies status vistior defensive and reports "I slit its throat so its fine. " pt has bleeding controlled to left hand. And able to wiggle fingers.

## 2017-01-18 NOTE — ED Notes (Signed)
Unable to locate pt in the lobby  

## 2017-01-18 NOTE — ED Triage Notes (Signed)
Pt states she was bitten by a pit bull early this am, around 1am, came here was told that there would be a 10 hour wait, see earlier note. Laceration and puncture wounds noted with swelling to hand and fingers. Positive radial pulse.

## 2017-01-18 NOTE — ED Notes (Signed)
Wound cleaned with normal saline and wrapped with sterile gauze.

## 2017-01-18 NOTE — ED Provider Notes (Signed)
[facility] Provider Note   CSN: [id number] Arrival date & time: 01/18/17  1157     History   Chief Complaint Chief Complaint  Patient presents with  . Animal Bite    HPI Kristen Schneider is a 26 y.o. female With history of polysubstance abuse, MDD, anxiety, and vitamin B12 deficiency presents today with chief complaint acute onset, progressively worsening pain and swelling to left hand secondary to dog bite.  She states that at around midnight her 2 dogs began fighting and in an attempt to pull them apart she sustained a laceration from 1 of the dogs.  She states the dog is up-to-date on its immunizations.  She notes multiple puncture wounds and a larger laceration to the dorsum of her left hand near the wrist.  Pain is constant and throbbing and occasionally sharp especially with movement.  She notes that she is unable to make a fist with her hand.  She denies numbness or tingling.  She states that the swelling has worsened even in the past 3 hours.  She states it has been draining clear-pink fluid.  She denies fevers or chills.  She presented to the ED initially after the wound was sustained.  An x-ray was obtained and the wound was washed in triage, however patient left due to long wait.  She returned for further evaluation with worsening pain and swelling.   The history is provided by the patient.    Past Medical History:  Diagnosis Date  . Abnormal menstrual periods   . Anxiety   . Polysubstance abuse (HCC)   . Shingles 2006  . Torn rotator cuff left shoulder    Patient Active Problem List   Diagnosis Date Noted  . Left breast abscess 08/31/2016  . New onset seizure (HCC) 08/15/2015  . Hypokalemia   . Polysubstance abuse (HCC)   . 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 Vitamin B12 deficiency 04/25/2015  . Alcohol use disorder, severe, dependence (HCC) 04/23/2015  . Cannabis use disorder, moderate, dependence (HCC)  04/23/2015  . Cocaine use disorder, moderate, dependence (HCC) 04/23/2015  . Opioid use disorder, moderate, dependence (HCC) 04/23/2015  . 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 MDD (major depressive disorder) 04/22/2015  . Tobacco use disorder 05/30/2014    Past Surgical History:  Procedure Laterality Date  . APPENDECTOMY    . LAPAROSCOPIC APPENDECTOMY N/A 05/23/2014   Procedure: APPENDECTOMY LAPAROSCOPIC;  Surgeon: Harriette Bouillonhomas Cornett, MD;  [facility];  Service: General;  Laterality: N/A;  . LAPAROSCOPIC LYSIS OF ADHESIONS N/A 05/23/2014   Procedure: LAPAROSCOPIC LYSIS OF ADHESIONS;  Surgeon: Harriette Bouillonhomas Cornett, MD;  Location: [facility] OR;  Service: General;  Laterality: N/A;  . POLYPECTOMY     As a child multiple colonoscopy's for polyp removal  . TONSILLECTOMY     26 y/o   . WISDOM TOOTH EXTRACTION     26 y/o     OB History    Gravida Para Term Preterm AB Living   2 0     2     SAB TAB Ectopic Multiple Live Births   2              Obstetric Comments   1st Menstrual Cycle: 7314  miscarriage at age 26 and 5218.       Home Medications    Prior to Admission medications   Medication Sig Start Date End Date Taking? Authorizing  Provider  gabapentin (NEURONTIN) 100 MG capsule Take 100 mg by mouth 2 (two) times daily.    [provider]  HYDROcodone-acetaminophen (NORCO/VICODIN) 5-325 MG tablet Take 1 tablet by mouth every 6 (six) hours as needed for moderate pain. 08/31/16   Earline Mayotte, MD  hydrOXYzine (ATARAX/VISTARIL) 25 MG tablet Take 1 tablet (25 mg total) by mouth 3 (three) times daily as needed for anxiety (sleep). 04/25/15   Jimmy Footman, MD  ibuprofen (ADVIL,MOTRIN) 800 MG tablet Take 800 mg by mouth every 8 (eight) hours as needed.    [provider]  risperiDONE (RISPERDAL) 0.25 MG tablet Take 0.25 mg by mouth daily.    [provider]    Family History Family  History  Problem Relation Age of Onset  . Alcoholism Father   . Colon cancer Neg Hx     Social History Social History   Tobacco Use  . Smoking status: Current Every Day Smoker    Packs/day: 1.00    Types: Cigarettes  . Smokeless tobacco: Former Engineer, water Use Topics  . Alcohol use: Yes    Alcohol/week: 0.0 oz  . Drug use: No    Comment: no cocaine for 2-3 months-- not using marijuana     Allergies   Seroquel [quetiapine fumarate] and Dilaudid [hydromorphone hcl]   Review of Systems Review of Systems  Constitutional: Negative for chills and fever.  Musculoskeletal: Positive for arthralgias.  Skin: Positive for wound.  Neurological: Negative for weakness and numbness.  All other systems reviewed and are negative.    Physical Exam Updated Vital Signs BP 115/79 (BP Location: Right Arm)   Pulse 91   Temp 97.8 F (36.6 C) (Oral)   Resp 18   Ht 5' 5.5" (1.664 m)   Wt 79.4 kg (175 lb)   LMP 12/19/2016   SpO2 96%   BMI 28.68 kg/m    Physical Exam  Constitutional: She appears well-developed and well-nourished. No distress.  HENT:  Head: Normocephalic and atraumatic.  Eyes: Conjunctivae are normal. Right eye exhibits no discharge. Left eye exhibits no discharge.  Neck: Normal range of motion. Neck supple. No JVD present. No tracheal deviation present.  Cardiovascular: Normal rate and intact distal pulses.  2+ radial pulses bilaterally  Pulmonary/Chest: Effort normal.  Abdominal: Soft. Bowel sounds are normal. She exhibits no distension. There is no tenderness.  Musculoskeletal: She exhibits edema and tenderness.  See attached images.  Multiple puncture wounds and smaller lacerations to the dorsum of the left hand with a larger 4cm laceration to the radial aspect of the dorsum of the left hand.  Subcutaneous fat is visible.  There is significant swelling and erythema to the dorsum of the left hand.  She has decreased range of motion of all digits, worst in the  second and third digits. Patient unable to oppose thumb to little finger. Diffuse tenderness to palpation with no crepitus noted. Unable to express purulent material from wounds.   Neurological: She is alert. No sensory deficit.  Fluent speech, no facial droop, sensation intact to soft touch of bilateral hands.  Unable to assess grip strength of the left hand secondary to pain and swelling  Skin: Skin is warm and dry. No erythema.  Psychiatric: She has a normal mood and affect. Her behavior is normal.  Nursing note and vitals reviewed.        ED Treatments / Results  Labs (all labs ordered are listed, but only abnormal results are displayed) Labs Reviewed -  No data to display  EKG  EKG Interpretation None       Radiology Dg Hand Complete Left  Result Date: 01/18/2017 CLINICAL DATA:  Bitten by dog in left hand, at the left index finger and thumb. EXAM: LEFT HAND - COMPLETE 3+ VIEW COMPARISON:  None. FINDINGS: There is no evidence of fracture or dislocation. The joint spaces are preserved. The carpal rows are intact, and demonstrate normal alignment. Scattered soft tissue air is noted tracking about the second and third metacarpophalangeal joints. No radiopaque foreign bodies are seen. IMPRESSION: No evidence of fracture or dislocation. No radiopaque foreign bodies seen. Electronically Signed   By: Roanna RaiderJeffery  Chang M.D.   On: 01/18/2017 02:32    Procedures Procedures (including critical care time)  Medications Ordered in ED Medications - No data to display   Initial Impression / Assessment and Plan / ED Course  I have reviewed the triage vital signs and the nursing notes.  Pertinent labs & imaging results that were available during my care of the patient were reviewed by me and considered in my medical decision making (see chart for details).     Patient presents with progressively worsening wound infection from dog bite which occurred around midnight last night.  Afebrile,  initially tachycardic with improvement while in the ED.  There is moderate swelling and erythema to the dorsum of the left hand.  She has good peripheral pulses.  She has severely decreased range of motion of the digits, primarily with flexion of the second and third digits.  Sensation intact to soft touch of the digits but she has some numbness surrounding the larger lacerations.  Radiographs show no evidence of fracture or dislocation or retained foreign body.  Tetanus updated while in the ED.  Mild leukocytosis, remainder of lab work is reassuring.  Will initiate IV vancomycin and Unasyn for wound infection.  5:34 PM Spoke with hand surgeon Dr. Evamarie Raetz MarbleWeingold, who recommends IV antibiotics, warm soaks 3 times daily for 20 minutes at a time.  He requests that the patient be kept n.p.o. after midnight and he will evaluate her tomorrow.  He states if she has not improved by tomorrow he may take her to the OR for washout.  Spoke with Dr. Rito EhrlichKrishnan with [facility] who agrees to assume care of patient and bring her into the hospital for further evaluation and management.   Final Clinical Impressions(s) / ED Diagnoses   Final diagnoses:  None    ED Discharge Orders    None      Bennye AlmFawze, Chae Shuster A, PA-C 01/19/17 Hessie Knows0023  Isaacs, Cameron, MD 01/19/17 [phone]

## 2017-01-18 NOTE — H&P (Signed)
[facility] History and Physical  Kristen Schneider ZOX:[id number] DOB: Sep 05, 1990 DOA: 01/18/2017   PCP: Patient, No Pcp Per  Specialists: Followed at [facility]  Chief Complaint: Dog bite left hand  HPI: Kristen Schneider is a 26 y.o. female with a past medical history of anxiety disorder who was in her usual state of health until earlier today when she was breaking up a fight between 2 dogs when her pit bull bit her in the left hand.  She mentioned that this was her dog and the dog is uptodate on vaccination.  Patient presented to the emergency department due to pain.  Complains of 9 out of 10 pain in the left hand currently.  No radiation of the pain.  Aggravated by movement.  Relieved with rest.  Some numbness around the bite area but denies any numbness in the fingertips.  Unable to move the flex her fingers.  Denies any other bite injuries.  Denies any fever or chills.  No nausea vomiting.  Hand surgery was consulted who requested that medicine admit the patient.  Home Medications: Prior to Admission medications   Medication Sig Start Date End Date Taking? Authorizing Provider  acetaminophen (TYLENOL) 325 MG tablet Take 650 mg by mouth every 6 (six) hours as needed for mild pain.   Yes [provider]  gabapentin (NEURONTIN) 100 MG capsule Take 100 mg by mouth 2 (two) times daily.   Yes [provider]  hydrOXYzine (ATARAX/VISTARIL) 25 MG tablet Take 1 tablet (25 mg total) by mouth 3 (three) times daily as needed for anxiety (sleep). 04/25/15  Yes Jimmy Footman, MD    Allergies:  Allergies  Allergen Reactions  . Seroquel [Quetiapine Fumarate] Other (See Comments)    restless leg  . Dilaudid [Hydromorphone Hcl] Rash    Rash and itching    Past Medical History: Past Medical History:  Diagnosis Date  . Abnormal menstrual periods   . Anxiety   . Polysubstance abuse (HCC)   . Shingles 2006  . Torn rotator cuff left  shoulder    Past Surgical History:  Procedure Laterality Date  . APPENDECTOMY    . LAPAROSCOPIC APPENDECTOMY N/A 05/23/2014   Procedure: APPENDECTOMY LAPAROSCOPIC;  Surgeon: Harriette Bouillon, MD;  Location: [facility] OR;  Service: General;  Laterality: N/A;  . LAPAROSCOPIC LYSIS OF ADHESIONS N/A 05/23/2014   Procedure: LAPAROSCOPIC LYSIS OF ADHESIONS;  Surgeon: Harriette Bouillon, MD;  Location: [facility] OR;  Service: General;  Laterality: N/A;  . POLYPECTOMY     As a child multiple colonoscopy's for polyp removal  . TONSILLECTOMY     26 y/o   . WISDOM TOOTH EXTRACTION     48 y/o     Social History: She lives with her boyfriend.  Her last menstrual period was just a few days ago.  Smokes half a pack of cigarettes on a daily basis.  Occasional beer.  Denies any illicit drug use.   Family History:  Family History  Problem Relation Age of Onset  . Alcoholism Father   . Colon cancer Neg Hx      Review of Systems - History obtained from the patient General ROS: negative Psychological ROS: negative Ophthalmic ROS: negative ENT ROS: negative Allergy and Immunology ROS: negative Hematological and Lymphatic ROS: negative Endocrine ROS: negative Respiratory ROS: no cough, shortness of breath, or wheezing Cardiovascular ROS: no chest pain or dyspnea on exertion Gastrointestinal ROS: no abdominal pain, change in bowel habits, or black or bloody stools Genito-Urinary  ROS: no dysuria, trouble voiding, or hematuria Musculoskeletal ROS: as in hpi Neurological ROS: no TIA or stroke symptoms Dermatological ROS: negative  Physical Examination  Vitals:   01/18/17 1201 01/18/17 1533 01/18/17 1811  BP: 109/82 115/79 125/84  Pulse: (!) 105 91 98  Resp: 16 18 18   Temp: 97.8 F (36.6 C)    TempSrc: Oral    SpO2: 96% 96% 95%  Weight: 79.4 kg (175 lb)    Height: 5' 5.5" (1.664 m)      BP 125/84 (BP Location: Right Arm)   Pulse 98   Temp 97.8 F (36.6 C) (Oral)   Resp 18   Ht 5' 5.5" (1.664 m)   Wt  79.4 kg (175 lb)   LMP 12/19/2016   SpO2 95%   BMI 28.68 kg/m   General appearance: alert, cooperative, appears stated age and no distress Head: Normocephalic, without obvious abnormality, atraumatic Eyes: conjunctivae/corneas clear. PERRL, EOM's intact.  Throat: lips, mucosa, and tongue normal; teeth and gums normal Neck: no adenopathy, no carotid bruit, no JVD, supple, symmetrical, trachea midline and thyroid not enlarged, symmetric, no tenderness/mass/nodules Resp: clear to auscultation bilaterally Cardio: regular rate and rhythm, S1, S2 normal, no murmur, click, rub or gallop GI: soft, non-tender; bowel sounds normal; no masses,  no organomegaly Extremities: Left hand is noted to be swollen.  Bite injury is noted on the dorsal aspect of the left hand below the thumb.  No drainage is present no bleeding is noted.  Good radial pulses.  Unable to make a fist out of her hand.  Unable to fully extend her fingers.  Good capillary refill.  Erythema is noted around the bite injury. Pulses: 2+ and symmetric Skin: Skin color, texture, turgor normal. No rashes or lesions Lymph nodes: Cervical, supraclavicular, and axillary nodes normal. Neurologic: No obvious focal neurological deficits. alert and oriented x3.   Labs on Admission: I have personally reviewed following labs and imaging studies  CBC: Recent Labs  Lab 01/18/17 1600  WBC 10.9*  NEUTROABS 7.8*  HGB 14.8  HCT 42.9  MCV 95.5  PLT 264   Basic Metabolic Panel: Recent Labs  Lab 01/18/17 1600  NA 136  K 3.4*  CL 102  CO2 19*  GLUCOSE 105*  BUN 12  CREATININE 0.74  CALCIUM 8.6*   GFR: Estimated Creatinine Clearance: 113.2 mL/min (by C-G formula based on SCr of 0.74 mg/dL).   Radiological Exams on Admission: Dg Hand Complete Left  Result Date: 01/18/2017 CLINICAL DATA:  Bitten by dog in left hand, at the left index finger and thumb. EXAM: LEFT HAND - COMPLETE 3+ VIEW COMPARISON:  None. FINDINGS: There is no evidence  of fracture or dislocation. The joint spaces are preserved. The carpal rows are intact, and demonstrate normal alignment. Scattered soft tissue air is noted tracking about the second and third metacarpophalangeal joints. No radiopaque foreign bodies are seen. IMPRESSION: No evidence of fracture or dislocation. No radiopaque foreign bodies seen. Electronically Signed   By: Roanna RaiderJeffery  Chang M.D.   On: 01/18/2017 02:32      Problem List  Principal Problem:   Dog bite, hand, left, initial encounter Active Problems:   Cellulitis of left hand   Anxiety disorder   Assessment: This is a 26 year old Caucasian female with a past medical history of anxiety disorder who comes in after she was bit by her dog in her left hand.  Hand surgery was consulted who requested that medicine admit this patient.  Plan: #1 dog bite  left hand with early cellulitis: X-ray does not show any obvious abnormal findings.  She does have some erythema in the left hand.  She has good pulses.  No neurological deficits.  She will be continued on vancomycin and Unasyn.  Hand surgery to see her tomorrow.  She will be kept n.p.o. past midnight per their request.  Warm soaks 3 times a day as per their recommendations.  Pain control.  According to patient the dog is is her own and is up-to-date on vaccination.  Patient was given tetanus toxoid in the ED.  #2  History of anxiety disorder: Continue with home medications.  [facility]  #3 mild hypokalemia: This will be repleted.  We will check a urine pregnancy test.   DVT Prophylaxis: SCDs for now Code Status: Full code Family Communication: Discussed with the patient Consults called: Dr. Mina MarbleWeingold with hand surgery  Severity of Illness: The appropriate patient status for this patient is INPATIENT. Inpatient status is judged to be reasonable and necessary in order to provide the required intensity of service to ensure the patient's safety. [name] presenting  symptoms, physical exam findings, and initial radiographic and laboratory data in the context of their chronic comorbidities is felt to place them at high risk for further clinical deterioration. Furthermore, it is not anticipated that the patient will be medically stable for discharge from the hospital within 2 midnights of admission. The following factors support the patient status of inpatient.   " [name] presenting symptoms include dog bite. " The worrisome physical exam findings include cellulitis. " The initial radiographic and laboratory data are worrisome because of cellulitis. " The chronic co-morbidities include anxiety disorder.   * I certify that at the point of admission it is my clinical judgment that the patient will require inpatient hospital care spanning beyond 2 midnights from the point of admission due to high intensity of service, high risk for further deterioration and high frequency of surveillance required.*   Further management decisions will depend on results of further testing and patient's response to treatment.   Osvaldo ShipperGokul Rowen Hur  [facility] Pager [phone]  If 7PM-7AM, please contact night-coverage [email or web address] Password [email or web address]  01/18/2017, 6:52 PM

## 2017-01-18 NOTE — ED Notes (Signed)
Incision cleaned with warm water and soap.

## 2017-01-19 ENCOUNTER — Encounter (HOSPITAL_COMMUNITY): Payer: Self-pay

## 2017-01-19 ENCOUNTER — Encounter (HOSPITAL_COMMUNITY)
Admission: EM | Disposition: A | Discharge status: Home / Self Care | Payer: Self-pay | Source: Home / Self Care | Attending: Internal Medicine

## 2017-01-19 ENCOUNTER — Inpatient Hospital Stay (HOSPITAL_COMMUNITY): Payer: Self-pay | Admitting: Anesthesiology

## 2017-01-19 ENCOUNTER — Other Ambulatory Visit: Payer: Self-pay

## 2017-01-19 ENCOUNTER — Other Ambulatory Visit: Payer: Self-pay | Admitting: Orthopedic Surgery

## 2017-01-19 HISTORY — PX: I&D EXTREMITY: SHX5045

## 2017-01-19 LAB — COMPREHENSIVE METABOLIC PANEL
ALBUMIN: 3.1 g/dL — AB (ref 3.5–5.0)
ALT: 13 U/L — ABNORMAL LOW (ref 14–54)
ANION GAP: 6 (ref 5–15)
AST: 19 U/L (ref 15–41)
Alkaline Phosphatase: 56 U/L (ref 38–126)
BUN: 15 mg/dL (ref 6–20)
CHLORIDE: 106 mmol/L (ref 101–111)
CO2: 22 mmol/L (ref 22–32)
Calcium: 8.5 mg/dL — ABNORMAL LOW (ref 8.9–10.3)
Creatinine, Ser: 0.78 mg/dL (ref 0.44–1.00)
GFR calc Af Amer: >60 mL/min (ref >60)
GFR calc non Af Amer: >60 mL/min (ref >60)
GLUCOSE: 101 mg/dL — AB (ref 65–99)
POTASSIUM: 3.9 mmol/L (ref 3.5–5.1)
Sodium: 134 mmol/L — ABNORMAL LOW (ref 135–145)
TOTAL PROTEIN: 5.5 g/dL — AB (ref 6.5–8.1)
Total Bilirubin: 1.4 mg/dL — ABNORMAL HIGH (ref 0.3–1.2)

## 2017-01-19 LAB — CBC
HEMATOCRIT: 40.5 % (ref 36.0–46.0)
HEMOGLOBIN: 13.7 g/dL (ref 12.0–15.0)
MCH: 32.3 pg (ref 26.0–34.0)
MCHC: 33.8 g/dL (ref 30.0–36.0)
MCV: 95.5 fL (ref 78.0–100.0)
Platelets: 233 10*3/uL (ref 150–400)
RBC: 4.24 MIL/uL (ref 3.87–5.11)
RDW: 13.3 % (ref 11.5–15.5)
WBC: 9.5 10*3/uL (ref 4.0–10.5)

## 2017-01-19 LAB — PREGNANCY, URINE: Preg Test, Ur: NEGATIVE

## 2017-01-19 LAB — HIV ANTIBODY (ROUTINE TESTING W REFLEX): HIV SCREEN 4TH GENERATION: NONREACTIVE

## 2017-01-19 SURGERY — IRRIGATION AND DEBRIDEMENT EXTREMITY
Anesthesia: General | Site: Hand | Laterality: Left

## 2017-01-19 MED ORDER — MIDAZOLAM HCL 2 MG/2ML IJ SOLN
INTRAMUSCULAR | Status: AC
  Filled 2017-01-19: qty 2

## 2017-01-19 MED ORDER — PROPOFOL 10 MG/ML IV BOLUS
INTRAVENOUS | Status: DC | PRN
  Administered 2017-01-19: 200 mg via INTRAVENOUS

## 2017-01-19 MED ORDER — FENTANYL CITRATE (PF) 250 MCG/5ML IJ SOLN
INTRAMUSCULAR | Status: AC
  Filled 2017-01-19: qty 5

## 2017-01-19 MED ORDER — BUPIVACAINE HCL (PF) 0.25 % IJ SOLN
INTRAMUSCULAR | Status: DC | PRN
  Administered 2017-01-19: 30 mL

## 2017-01-19 MED ORDER — PROPOFOL 10 MG/ML IV BOLUS
INTRAVENOUS | Status: AC
  Filled 2017-01-19: qty 20

## 2017-01-19 MED ORDER — DEXAMETHASONE SODIUM PHOSPHATE 10 MG/ML IJ SOLN
INTRAMUSCULAR | Status: AC
  Filled 2017-01-19: qty 1

## 2017-01-19 MED ORDER — KETOROLAC TROMETHAMINE 30 MG/ML IJ SOLN
INTRAMUSCULAR | Status: AC
  Filled 2017-01-19: qty 1

## 2017-01-19 MED ORDER — MIDAZOLAM HCL 5 MG/5ML IJ SOLN
INTRAMUSCULAR | Status: DC | PRN
  Administered 2017-01-19: 2 mg via INTRAVENOUS

## 2017-01-19 MED ORDER — KETOROLAC TROMETHAMINE 30 MG/ML IJ SOLN
30.0000 mg | Freq: Once | INTRAMUSCULAR | Status: DC | PRN
  Administered 2017-01-19: 30 mg via INTRAVENOUS

## 2017-01-19 MED ORDER — CHLORHEXIDINE GLUCONATE 4 % EX LIQD
60.0000 mL | Freq: Once | CUTANEOUS | Status: DC
  Filled 2017-01-19: qty 60

## 2017-01-19 MED ORDER — FENTANYL CITRATE (PF) 100 MCG/2ML IJ SOLN
INTRAMUSCULAR | Status: AC
Start: 2017-01-19 — End: 2017-01-20
  Filled 2017-01-19: qty 2

## 2017-01-19 MED ORDER — SODIUM CHLORIDE 0.9 % IR SOLN
Status: DC | PRN
  Administered 2017-01-19: 1000 mL

## 2017-01-19 MED ORDER — BUPIVACAINE HCL (PF) 0.25 % IJ SOLN
INTRAMUSCULAR | Status: AC
  Filled 2017-01-19: qty 30

## 2017-01-19 MED ORDER — OXYCODONE HCL 5 MG PO TABS
10.0000 mg | ORAL_TABLET | ORAL | Status: DC | PRN
  Administered 2017-01-19 – 2017-01-20 (×3): 10 mg via ORAL
  Filled 2017-01-19 (×3): qty 2

## 2017-01-19 MED ORDER — ONDANSETRON HCL 4 MG/2ML IJ SOLN
INTRAMUSCULAR | Status: DC | PRN
  Administered 2017-01-19: 4 mg via INTRAVENOUS

## 2017-01-19 MED ORDER — ONDANSETRON HCL 4 MG/2ML IJ SOLN
INTRAMUSCULAR | Status: AC
  Filled 2017-01-19: qty 2

## 2017-01-19 MED ORDER — FENTANYL CITRATE (PF) 100 MCG/2ML IJ SOLN
25.0000 ug | INTRAMUSCULAR | Status: DC | PRN
Start: 2017-01-19 — End: 2017-01-19
  Administered 2017-01-19 (×2): 50 ug via INTRAVENOUS

## 2017-01-19 MED ORDER — KETAMINE HCL-SODIUM CHLORIDE 100-0.9 MG/10ML-% IV SOSY
PREFILLED_SYRINGE | INTRAVENOUS | Status: AC
  Filled 2017-01-19: qty 10

## 2017-01-19 MED ORDER — KETAMINE HCL 10 MG/ML IJ SOLN
INTRAMUSCULAR | Status: DC | PRN
  Administered 2017-01-19: 10 mg via INTRAVENOUS
  Administered 2017-01-19: 40 mg via INTRAVENOUS

## 2017-01-19 MED ORDER — PROMETHAZINE HCL 25 MG/ML IJ SOLN: 6.2500 mg | INTRAMUSCULAR | Status: DC | PRN

## 2017-01-19 MED ORDER — LIDOCAINE 2% (20 MG/ML) 5 ML SYRINGE
INTRAMUSCULAR | Status: DC | PRN
  Administered 2017-01-19: 60 mg via INTRAVENOUS

## 2017-01-19 MED ORDER — FENTANYL CITRATE (PF) 100 MCG/2ML IJ SOLN
INTRAMUSCULAR | Status: DC | PRN
  Administered 2017-01-19 (×3): 50 ug via INTRAVENOUS

## 2017-01-19 MED ORDER — LACTATED RINGERS IV SOLN
INTRAVENOUS | Status: DC
  Administered 2017-01-19: 15:00:00 via INTRAVENOUS

## 2017-01-19 MED ORDER — DEXAMETHASONE SODIUM PHOSPHATE 10 MG/ML IJ SOLN
INTRAMUSCULAR | Status: DC | PRN
  Administered 2017-01-19: 2 mg via INTRAVENOUS

## 2017-01-19 SURGICAL SUPPLY — 49 items
BANDAGE ACE 3X5.8 VEL STRL LF (GAUZE/BANDAGES/DRESSINGS) ×3 IMPLANT
BANDAGE ACE 4X5 VEL STRL LF (GAUZE/BANDAGES/DRESSINGS) ×3 IMPLANT
BNDG ADH 5X3 H2O RPLNT NS (GAUZE/BANDAGES/DRESSINGS) ×1
BNDG CMPR 9X4 STRL LF SNTH (GAUZE/BANDAGES/DRESSINGS)
BNDG COHESIVE 3X5 WHT NS (GAUZE/BANDAGES/DRESSINGS) ×3 IMPLANT
BNDG CONFORM 2 STRL LF (GAUZE/BANDAGES/DRESSINGS) IMPLANT
BNDG ESMARK 4X9 LF (GAUZE/BANDAGES/DRESSINGS) IMPLANT
BNDG GAUZE ELAST 4 BULKY (GAUZE/BANDAGES/DRESSINGS) ×3 IMPLANT
CABLE BIPOLOR RESECTION CORD (MISCELLANEOUS) ×3 IMPLANT
CORDS BIPOLAR (ELECTRODE) ×3 IMPLANT
COVER SURGICAL LIGHT HANDLE (MISCELLANEOUS) ×6 IMPLANT
CUFF TOURNIQUET SINGLE 18IN (TOURNIQUET CUFF) ×3 IMPLANT
DECANTER SPIKE VIAL GLASS SM (MISCELLANEOUS) ×3 IMPLANT
DRAPE SURG 17X23 STRL (DRAPES) ×3 IMPLANT
DURAPREP 26ML APPLICATOR (WOUND CARE) ×3 IMPLANT
GAUZE PACKING IODOFORM 1/4X15 (GAUZE/BANDAGES/DRESSINGS) ×3 IMPLANT
GAUZE PACKING IODOFORM 1/4X5 (PACKING) ×3 IMPLANT
GAUZE SPONGE 4X4 12PLY STRL (GAUZE/BANDAGES/DRESSINGS) ×6 IMPLANT
GAUZE SPONGE 4X4 12PLY STRL LF (GAUZE/BANDAGES/DRESSINGS) ×3 IMPLANT
GAUZE XEROFORM 1X8 LF (GAUZE/BANDAGES/DRESSINGS) ×3 IMPLANT
GLOVE SURG SYN 8.0 (GLOVE) ×3 IMPLANT
GOWN STRL REUS W/ TWL LRG LVL3 (GOWN DISPOSABLE) ×1 IMPLANT
GOWN STRL REUS W/ TWL XL LVL3 (GOWN DISPOSABLE) ×1 IMPLANT
GOWN STRL REUS W/TWL LRG LVL3 (GOWN DISPOSABLE) ×3
GOWN STRL REUS W/TWL XL LVL3 (GOWN DISPOSABLE) ×3
KIT BASIN OR (CUSTOM PROCEDURE TRAY) ×3 IMPLANT
KIT ROOM TURNOVER OR (KITS) ×3 IMPLANT
MANIFOLD NEPTUNE II (INSTRUMENTS) ×3 IMPLANT
NEEDLE HYPO 25GX1X1/2 BEV (NEEDLE) IMPLANT
NEEDLE HYPO 25X1 1.5 SAFETY (NEEDLE) ×3 IMPLANT
NS IRRIG 1000ML POUR BTL (IV SOLUTION) ×3 IMPLANT
PACK ORTHO EXTREMITY (CUSTOM PROCEDURE TRAY) ×3 IMPLANT
PAD ARMBOARD 7.5X6 YLW CONV (MISCELLANEOUS) ×6 IMPLANT
PAD CAST 3X4 CTTN HI CHSV (CAST SUPPLIES) ×1 IMPLANT
PAD CAST 4YDX4 CTTN HI CHSV (CAST SUPPLIES) ×2 IMPLANT
PADDING CAST COTTON 3X4 STRL (CAST SUPPLIES) ×3
PADDING CAST COTTON 4X4 STRL (CAST SUPPLIES) ×6
SPONGE LAP 18X18 X RAY DECT (DISPOSABLE) ×3 IMPLANT
SUT VIC AB 2-0 CT1 18 (SUTURE) ×3 IMPLANT
SUT VICRYL RAPIDE 4/0 PS 2 (SUTURE) IMPLANT
SWAB COLLECTION DEVICE MRSA (MISCELLANEOUS) ×3 IMPLANT
SWAB CULTURE ESWAB REG 1ML (MISCELLANEOUS) IMPLANT
SYR CONTROL 10ML LL (SYRINGE) ×3 IMPLANT
TOWEL OR 17X24 6PK STRL BLUE (TOWEL DISPOSABLE) ×3 IMPLANT
TOWEL OR 17X26 10 PK STRL BLUE (TOWEL DISPOSABLE) ×3 IMPLANT
TUBE CONNECTING 12'X1/4 (SUCTIONS) ×1
TUBE CONNECTING 12X1/4 (SUCTIONS) ×2 IMPLANT
UNDERPAD 30X30 (UNDERPADS AND DIAPERS) ×3 IMPLANT
YANKAUER SUCT BULB TIP NO VENT (SUCTIONS) ×3 IMPLANT

## 2017-01-19 NOTE — Progress Notes (Signed)
[facility] PROGRESS NOTE  Kristen Schneider MVH:[id number] DOB: 1990-12-30 DOA: 01/18/2017  PCP: Patient, No Pcp Per  Brief History/Interval Summary: 26 year old Caucasian female with a past medical history of anxiety disorder was brought by her pet pit bull.  She had early cellulitis changes.  She was seen by hand surgery.  She was hospitalized for further management.  Reason for Visit: Dog bite with early cellulitis of the left hand  Consultants: Dr. Mina MarbleWeingold with hand surgery  Procedures: None so far  Antibiotics: Vancomycin and Unasyn  Subjective/Interval History: Patient continues to complain of 8-9 out of 10 pain in the left hand.  Denies any other complaints.  ROS: Denies any nausea or vomiting  Objective:  Vital Signs  Vitals:   01/18/17 1533 01/18/17 1811 01/18/17 2111 01/19/17 0431  BP: 115/79 125/84 113/67 (!) 142/86  Pulse: 91 98 77 67  Resp: 18 18 18 20   Temp:   98.2 F (36.8 C) (!) 97.5 F (36.4 C)  TempSrc:   Oral Oral  SpO2: 96% 95% 97% 97%  Weight:   79.4 kg (175 lb)   Height:   5' 5.5" (1.664 m)     Intake/Output Summary (Last 24 hours) at 01/19/2017 1230 Last data filed at 01/19/2017 [id number] Gross per 24 hour  Intake 1073.75 ml  Output 300 ml  Net 773.75 ml   Filed Weights   01/18/17 1201 01/18/17 2111  Weight: 79.4 kg (175 lb) 79.4 kg (175 lb)    General appearance: alert, cooperative, appears stated age and no distress Head: Normocephalic, without obvious abnormality, atraumatic Resp: clear to auscultation bilaterally Cardio: regular rate and rhythm, S1, S2 normal, no murmur, click, rub or gallop GI: soft, non-tender; bowel sounds normal; no masses,  no organomegaly Extremities: Left hand covered in a dressing.  Good pulses. Neurologic: No focal deficits  Lab Results:  Data Reviewed: I have personally reviewed following labs and imaging studies  CBC: Recent Labs  Lab 01/18/17 1600 01/19/17 0430  WBC 10.9* 9.5    NEUTROABS 7.8*  --   HGB 14.8 13.7  HCT 42.9 40.5  MCV 95.5 95.5  PLT 264 233    Basic Metabolic Panel: Recent Labs  Lab 01/18/17 1600 01/19/17 0430  NA 136 134*  K 3.4* 3.9  CL 102 106  CO2 19* 22  GLUCOSE 105* 101*  BUN 12 15  CREATININE 0.74 0.78  CALCIUM 8.6* 8.5*    GFR: Estimated Creatinine Clearance: 113.2 mL/min (by C-G formula based on SCr of 0.78 mg/dL).  Liver Function Tests: Recent Labs  Lab 01/19/17 0430  AST 19  ALT 13*  ALKPHOS 56  BILITOT 1.4*  PROT 5.5*  ALBUMIN 3.1*      Recent Results (from the past 240 hour(s))  Surgical PCR screen     Status: None   Collection Time: 01/18/17  8:28 PM  Result Value Ref Range Status   MRSA, PCR NEGATIVE NEGATIVE Final   Staphylococcus aureus NEGATIVE NEGATIVE Final    Comment: (NOTE) The Xpert SA Assay (FDA approved for NASAL specimens in patients 25 years of age and older), is one component of a comprehensive surveillance program. It is not intended to diagnose infection nor to guide or monitor treatment.       Radiology Studies: Dg Hand Complete Left  Result Date: 01/18/2017 CLINICAL DATA:  Bitten by dog in left hand, at the left index finger and thumb. EXAM: LEFT HAND - COMPLETE 3+ VIEW COMPARISON:  None. FINDINGS: There is no  evidence of fracture or dislocation. The joint spaces are preserved. The carpal rows are intact, and demonstrate normal alignment. Scattered soft tissue air is noted tracking about the second and third metacarpophalangeal joints. No radiopaque foreign bodies are seen. IMPRESSION: No evidence of fracture or dislocation. No radiopaque foreign bodies seen. Electronically Signed   By: Roanna RaiderJeffery  Chang M.D.   On: 01/18/2017 02:32     Medications:  Scheduled: . gabapentin  100 mg Oral BID  . nicotine  14 mg Transdermal Daily   Continuous: . sodium chloride 75 mL/hr at 01/19/17 0015  . ampicillin-sulbactam (UNASYN) IV 1.5 g (01/19/17 1044)  . vancomycin Stopped (01/19/17 1044)    ZOX:[id number]:acetaminophen **OR** acetaminophen, albuterol, hydrOXYzine, ketorolac, ondansetron **OR** ondansetron (ZOFRAN) IV, oxyCODONE  Assessment/Plan:  Principal Problem:   Dog bite, hand, left, initial encounter Active Problems:   Cellulitis of left hand   Anxiety disorder    Dog bite and early cellulitis of the left hand Continue vancomycin and Unasyn.  Patient seen by hand surgery.  Plan is for surgical debridement today.  Pain control.  Patient was given tetanus toxoid in the emergency department.  Anticipate transition to oral antibiotics tomorrow.  History of anxiety disorder Continue home medications.  [facility].  Hypokalemia Repleted.  Urine pregnancy test was negative.  DVT Prophylaxis: SCDs    Code Status: Full code Family Communication: Discussed with patient Disposition Plan: Management as outlined above.    LOS: 1 day   Osvaldo ShipperGokul Marcena Dias  [facility] Pager [phone] 01/19/2017, 12:30 PM  If 7PM-7AM, please contact night-coverage at [email or web address], password [email or web address]

## 2017-01-19 NOTE — Anesthesia Preprocedure Evaluation (Signed)
Anesthesia Evaluation  Patient identified by MRN, date of birth, ID band Patient awake    Reviewed: Allergy & Precautions, NPO status , Patient's Chart, lab work & pertinent test results  Airway Mallampati: II  TM Distance: >3 FB Neck ROM: Full    Dental no notable dental hx.    Pulmonary Current Smoker,    Pulmonary exam normal breath sounds clear to auscultation       Cardiovascular negative cardio ROS Normal cardiovascular exam Rhythm:Regular Rate:Normal     Neuro/Psych negative neurological ROS  negative psych ROS   GI/Hepatic negative GI ROS, (+)     substance abuse  cocaine use,   Endo/Other  negative endocrine ROS  Renal/GU negative Renal ROS  negative genitourinary   Musculoskeletal negative musculoskeletal ROS (+)   Abdominal   Peds negative pediatric ROS (+)  Hematology negative hematology ROS (+)   Anesthesia Other Findings   Reproductive/Obstetrics negative OB ROS                             Anesthesia Physical Anesthesia Plan  ASA: III  Anesthesia Plan: General   Post-op Pain Management:    Induction: Intravenous  PONV Risk Score and Plan: 2 and Ondansetron, Dexamethasone and Treatment may vary due to age or medical condition  Airway Management Planned: LMA  Additional Equipment:   Intra-op Plan:   Post-operative Plan: Extubation in OR  Informed Consent: I have reviewed the patients History and Physical, chart, labs and discussed the procedure including the risks, benefits and alternatives for the proposed anesthesia with the patient or authorized representative who has indicated his/her understanding and acceptance.   Dental advisory given  Plan Discussed with: CRNA and Surgeon  Anesthesia Plan Comments:         Anesthesia Quick Evaluation

## 2017-01-19 NOTE — Anesthesia Procedure Notes (Signed)
Procedure Name: LMA Insertion Date/Time: 01/19/2017 3:26 PM Performed by: White, Cordella RegisterKelsey Tena Darra Rosa, CRNA Pre-anesthesia Checklist: Patient identified, Emergency Drugs available, Suction available and Patient being monitored Patient Re-evaluated:Patient Re-evaluated prior to induction Oxygen Delivery Method: Circle System Utilized Preoxygenation: Pre-oxygenation with 100% oxygen Induction Type: IV induction Ventilation: Mask ventilation without difficulty LMA: LMA inserted LMA Size: 4.0 Number of attempts: 1 Airway Equipment and Method: Bite block Placement Confirmation: positive ETCO2 Tube secured with: Tape Dental Injury: Teeth and Oropharynx as per pre-operative assessment

## 2017-01-19 NOTE — Transfer of Care (Signed)
Immediate Anesthesia Transfer of Care Note  Patient: Kristen Schneider  Procedure(s) Performed: IRRIGATION AND DEBRIDEMENT EXTREMITY (Left Hand)  Patient Location: PACU  Anesthesia Type:General  Level of Consciousness: lethargic and responds to stimulation  Airway & Oxygen Therapy: Patient Spontanous Breathing and Patient connected to nasal cannula oxygen  Post-op Assessment: Report given to RN and Post -op Vital signs reviewed and stable  Post vital signs: Reviewed and stable  Last Vitals:  Vitals:   01/19/17 0431 01/19/17 1404  BP: (!) 142/86 112/75  Pulse: 67 60  Resp: 20 18  Temp: (!) 36.4 C 36.6 C  SpO2: 97% 97%    Last Pain:  Vitals:   01/19/17 1404  TempSrc: Oral  PainSc:       Patients Stated Pain Goal: 2 (01/19/17 0847)  Complications: No apparent anesthesia complications

## 2017-01-19 NOTE — Anesthesia Postprocedure Evaluation (Signed)
Anesthesia Post Note  Patient: Kristen Schneider  Procedure(Schneider) Performed: IRRIGATION AND DEBRIDEMENT EXTREMITY (Left Hand)     Anesthesia Post Evaluation  Last Vitals:  Vitals:   01/19/17 1404 01/19/17 1600  BP: 112/75 (!) 129/95  Pulse: 60 80  Resp: 18 (!) 8  Temp: 36.6 C (!) 36.1 C  SpO2: 97% 91%    Last Pain:  Vitals:   01/19/17 1404  TempSrc: Oral  PainSc:                  Kristen Schneider

## 2017-01-19 NOTE — Progress Notes (Signed)
Patient underwent incision and drainage of left index finger flexor sheath and left hand dorsal wounds 4. Wounds were packed open with iodoform gauze soaked in Marcaine. Patient will need a follow-up in my office this Friday for packing removal initiation of local wound care. Would recommend continuation of antibiotics to cover for dog bite. Patient does not require narcotic medication for soft tissue surgery only. Would recommend therapeutic doses of ibuprofen or Celebrex. We'll need to see my office this Friday. Intraoperative cultures were sent as well as stat Gram stain

## 2017-01-19 NOTE — Consult Note (Signed)
Reason for Consult:left hand dog bite Referring Physician:Krishnan  Kristen Schneider is an 26 y.o. female.  HPI: patient's a very pleasant 26 year old right-hand-dominant female status post dog bite to nondominant left hand on Christmas Evewho presented to the emergency department with pain and swelling in her left hand. Initial white count was slightly elevated and exam was consistent with erythema. Examination today reveals swelling and pain over the flexor sheath of the index finger and dorsal aspect of the left hand. White count is now normal the clinical picture suggests possible deep infection.  Past Medical History:  Diagnosis Date  . Abnormal menstrual periods   . Anxiety   . Polysubstance abuse ([place])   . Shingles 2006  . Torn rotator cuff left shoulder    Past Surgical History:  Procedure Laterality Date  . APPENDECTOMY    . LAPAROSCOPIC APPENDECTOMY N/A 05/23/2014   Procedure: APPENDECTOMY LAPAROSCOPIC;  Surgeon: Erroll Luna, MD;  Location: [facility];  Service: General;  Laterality: N/A;  . LAPAROSCOPIC LYSIS OF ADHESIONS N/A 05/23/2014   Procedure: LAPAROSCOPIC LYSIS OF ADHESIONS;  Surgeon: Erroll Luna, MD;  Location: [place];  Service: General;  Laterality: N/A;  . POLYPECTOMY     As a child multiple colonoscopy's for polyp removal  . TONSILLECTOMY     26 y/o   . WISDOM TOOTH EXTRACTION     26 y/o     Family History  Problem Relation Age of Onset  . Alcoholism Father   . Colon cancer Neg Hx     Social History:  reports that she has been smoking cigarettes.  She has been smoking about 1.00 pack per day. She has quit using smokeless tobacco. She reports that she drinks alcohol. She reports that she does not use drugs.  Allergies:  Allergies  Allergen Reactions  . Seroquel [Quetiapine Fumarate] Other (See Comments)    restless leg  . Dilaudid [Hydromorphone Hcl] Rash    Rash and itching    Medications:  Scheduled: . gabapentin  100 mg Oral BID  . nicotine   14 mg Transdermal Daily    Results for orders placed or performed during the hospital encounter of 01/18/17 (from the past 48 hour(s))  CBC with Differential     Status: Abnormal   Collection Time: 01/18/17  4:00 PM  Result Value Ref Range   WBC 10.9 (H) 4.0 - 10.5 K/uL   RBC 4.49 3.87 - 5.11 MIL/uL   Hemoglobin 14.8 12.0 - 15.0 g/dL   HCT 42.9 36.0 - 46.0 %   MCV 95.5 78.0 - 100.0 fL   MCH 33.0 26.0 - 34.0 pg   MCHC 34.5 30.0 - 36.0 g/dL   RDW 13.4 11.5 - 15.5 %   Platelets 264 150 - 400 K/uL   Neutrophils Relative % 71 %   Neutro Abs 7.8 (H) 1.7 - 7.7 K/uL   Lymphocytes Relative 22 %   Lymphs Abs 2.4 0.7 - 4.0 K/uL   Monocytes Relative 7 %   Monocytes Absolute 0.7 0.1 - 1.0 K/uL   Eosinophils Relative 0 %   Eosinophils Absolute 0.0 0.0 - 0.7 K/uL   Basophils Relative 0 %   Basophils Absolute 0.0 0.0 - 0.1 K/uL  Basic metabolic panel     Status: Abnormal   Collection Time: 01/18/17  4:00 PM  Result Value Ref Range   Sodium 136 135 - 145 mmol/L   Potassium 3.4 (L) 3.5 - 5.1 mmol/L   Chloride 102 101 - 111 mmol/L  CO2 19 (L) 22 - 32 mmol/L   Glucose, Bld 105 (H) 65 - 99 mg/dL   BUN 12 6 - 20 mg/dL   Creatinine, Ser 0.74 0.44 - 1.00 mg/dL   Calcium 8.6 (L) 8.9 - 10.3 mg/dL   GFR calc non Af Amer >60 >60 mL/min   GFR calc Af Amer >60 >60 mL/min    Comment: (NOTE) The eGFR has been calculated using the CKD EPI equation. This calculation has not been validated in all clinical situations. eGFR's persistently <60 mL/min signify possible Chronic Kidney Disease.    Anion gap 15 5 - 15  Surgical PCR screen     Status: None   Collection Time: 01/18/17  8:28 PM  Result Value Ref Range   MRSA, PCR NEGATIVE NEGATIVE   Staphylococcus aureus NEGATIVE NEGATIVE    Comment: (NOTE) The Xpert SA Assay (FDA approved for NASAL specimens in patients 73 years of age and older), is one component of a comprehensive surveillance program. It is not intended to diagnose infection nor  to guide or monitor treatment.   Comprehensive metabolic panel     Status: Abnormal   Collection Time: 01/19/17  4:30 AM  Result Value Ref Range   Sodium 134 (L) 135 - 145 mmol/L   Potassium 3.9 3.5 - 5.1 mmol/L   Chloride 106 101 - 111 mmol/L   CO2 22 22 - 32 mmol/L   Glucose, Bld 101 (H) 65 - 99 mg/dL   BUN 15 6 - 20 mg/dL   Creatinine, Ser 0.78 0.44 - 1.00 mg/dL   Calcium 8.5 (L) 8.9 - 10.3 mg/dL   Total Protein 5.5 (L) 6.5 - 8.1 g/dL   Albumin 3.1 (L) 3.5 - 5.0 g/dL   AST 19 15 - 41 U/L   ALT 13 (L) 14 - 54 U/L   Alkaline Phosphatase 56 38 - 126 U/L   Total Bilirubin 1.4 (H) 0.3 - 1.2 mg/dL   GFR calc non Af Amer >60 >60 mL/min   GFR calc Af Amer >60 >60 mL/min    Comment: (NOTE) The eGFR has been calculated using the CKD EPI equation. This calculation has not been validated in all clinical situations. eGFR's persistently <60 mL/min signify possible Chronic Kidney Disease.    Anion gap 6 5 - 15  CBC     Status: None   Collection Time: 01/19/17  4:30 AM  Result Value Ref Range   WBC 9.5 4.0 - 10.5 K/uL   RBC 4.24 3.87 - 5.11 MIL/uL   Hemoglobin 13.7 12.0 - 15.0 g/dL   HCT 40.5 36.0 - 46.0 %   MCV 95.5 78.0 - 100.0 fL   MCH 32.3 26.0 - 34.0 pg   MCHC 33.8 30.0 - 36.0 g/dL   RDW 13.3 11.5 - 15.5 %   Platelets 233 150 - 400 K/uL  Pregnancy, urine     Status: None   Collection Time: 01/19/17  4:50 AM  Result Value Ref Range   Preg Test, Ur NEGATIVE NEGATIVE    Comment:        THE SENSITIVITY OF THIS METHODOLOGY IS >20 mIU/mL.     Dg Hand Complete Left  Result Date: 01/18/2017 CLINICAL DATA:  Bitten by dog in left hand, at the left index finger and thumb. EXAM: LEFT HAND - COMPLETE 3+ VIEW COMPARISON:  None. FINDINGS: There is no evidence of fracture or dislocation. The joint spaces are preserved. The carpal rows are intact, and demonstrate normal alignment. Scattered soft tissue air  is noted tracking about the second and third metacarpophalangeal joints. No  radiopaque foreign bodies are seen. IMPRESSION: No evidence of fracture or dislocation. No radiopaque foreign bodies seen. Electronically Signed   By: Garald Balding M.D.   On: 01/18/2017 02:32    Review of Systems  All other systems reviewed and are negative.  Blood pressure (!) 142/86, pulse 67, temperature (!) 97.5 F (36.4 C), temperature source Oral, resp. rate 20, height 5' 5.5" (1.664 m), weight 79.4 kg (175 lb), SpO2 97 %. Physical Exam  Constitutional: She is oriented to person, place, and time. She appears well-developed and well-nourished.  HENT:  Head: Normocephalic and atraumatic.  Neck: Normal range of motion.  Cardiovascular: Normal rate.  Respiratory: Effort normal.  Musculoskeletal:       Left hand: She exhibits tenderness, laceration and swelling.  Left hand dog bite with multiple lacerations including palmar aspect index finger at the metacarpophalangeal joint flexion crease and dorsal aspect of the index and thumb webspace base. Mild erythema and pain with flexion extension of digits noted.  Neurological: She is alert and oriented to person, place, and time.  Skin: Skin is warm. There is erythema.  Psychiatric: She has a normal mood and affect. Her behavior is normal. Judgment and thought content normal.    Assessment/Plan: 26 year old female with deep dog bite to left hand over index finger and dorsal aspect of thumb index web space. Recommend incision and drainage today with discharge on oral antibiotics and follow-up in my office on Friday.  Sheral Apley [facility] 01/19/2017, 8:04 AM

## 2017-01-19 NOTE — Op Note (Signed)
Please see the dictated report [phone]#[id number]

## 2017-01-19 NOTE — Op Note (Signed)
Kristen Schneider:  Schneider, Kristen            ACCOUNT NO.:  [id number]  MEDICAL RECORD NO.:  [id number]  LOCATION:  [id number]                        FACILITY:  [facility]  PHYSICIAN:  Artist PaisMatthew A. Tomicka Lover, M.D.DATE OF BIRTH:  [birth year]  DATE OF PROCEDURE:  01/19/2017 DATE OF DISCHARGE:                              OPERATIVE REPORT   PREOPERATIVE DIAGNOSIS:  Dog bite, left hand.  POSTOPERATIVE DIAGNOSIS:  Dog bite, left hand.  PROCEDURE:  Incision and drainage, flexor sheath, left index finger and left hand dorsal wounds x4.  SURGEON:  Artist PaisMatthew A. Mina MarbleWeingold, MD.  ASSISTANT:  None.  ANESTHESIA:  General.  COMPLICATION:  None.  DRAINS:  None.  CULTURES SENT:  Wound packed open.  The patient was taken to the operating suite after induction of adequate general anesthetic.  Left upper extremity was prepped and draped in a sterile fashion.  An Esmarch was used to exsanguinate the limb. Tourniquet was inflated to 250 mmHg.  At this point in time, a bite over the flexor sheath of the index finger on the left from an animal (dog) that occurred 48 hours prior to admission was opened over the proximal phalanx of the index finger palmarly and dissection was carried down to the flexor sheath both proximally and distally.  Cloudy fluid was cultured for aerobic, anaerobic, and Gram stain.  A second incision was made incorporating the large laceration at the base of the index and thumb web space.  We ligated a large vein with 2-0 Vicryl suture and opened up the wound proximally and distally.  There were also three other small wounds, one over the long metacarpal area, one over the ring metacarpal area distally and one over the metacarpal of the ring proximally.  We opened these up with hemostats and bluntly dissected. We then irrigated all 5 wounds out with normal saline 1 L until the irrigant was clear.  All 5 wounds were then packed open with quarter-inch iodoform gauze soaked in 0.25% Marcaine.  We then  dressed with 4 x 4's, fluffs, and a compression bandage.  The patient tolerated these procedures well and went to the recovery room in stable fashion.     Artist PaisMatthew A. Mina MarbleWeingold, M.D.     MAW/MEDQ  D:  01/19/2017  T:  01/19/2017  Job:  [id number]

## 2017-01-20 ENCOUNTER — Encounter (HOSPITAL_COMMUNITY): Payer: Self-pay | Admitting: Orthopedic Surgery

## 2017-01-20 MED ORDER — AMOXICILLIN-POT CLAVULANATE 875-125 MG PO TABS
1.0000 | ORAL_TABLET | Freq: Two times a day (BID) | ORAL | Status: DC
  Administered 2017-01-20: 1 via ORAL
  Filled 2017-01-20: qty 1

## 2017-01-20 MED ORDER — IBUPROFEN 600 MG PO TABS: 600.0000 mg | ORAL_TABLET | Freq: Four times a day (QID) | ORAL | 0 refills | Status: DC | PRN

## 2017-01-20 MED ORDER — OXYCODONE HCL 5 MG PO TABS: 5.0000 mg | ORAL_TABLET | ORAL | 0 refills | Status: DC | PRN

## 2017-01-20 MED ORDER — NICOTINE 14 MG/24HR TD PT24: 14.0000 mg | MEDICATED_PATCH | Freq: Every day | TRANSDERMAL | 0 refills | Status: AC

## 2017-01-20 MED ORDER — AMOXICILLIN-POT CLAVULANATE 875-125 MG PO TABS: 1.0000 | ORAL_TABLET | Freq: Two times a day (BID) | ORAL | 0 refills | Status: AC

## 2017-01-20 NOTE — Progress Notes (Signed)
Discharge instruction reviewed with patient, no questions asked by patient.  She was given her instructions along with her prescriptions - both IVs were removed  Pt ambulated floor on her own. All her belongings were with her and she packed them up on her own.

## 2017-01-20 NOTE — Discharge Summary (Signed)
[facility]  Physician Discharge Summary   Patient ID: Kristen Schneider MRN: [id number] DOB/AGE: 1990-10-10 26 y.o.  Admit date: 01/18/2017 Discharge date: 01/20/2017  PCP: Patient, No Pcp Per  DISCHARGE DIAGNOSES:  Principal Problem:   Dog bite, hand, left, initial encounter Active Problems:   Cellulitis of left hand   Anxiety disorder   RECOMMENDATIONS FOR OUTPATIENT FOLLOW UP: 1. Patient to follow-up with Dr. Mina MarbleWeingold tomorrow.  DISCHARGE CONDITION: fair  Diet recommendation: As before  [facility]   01/18/17 1201 01/18/17 2111 01/19/17 1503  Weight: 79.4 kg (175 lb) 79.4 kg (175 lb) 79.4 kg (175 lb)    INITIAL HISTORY: 26 year old Caucasian female with a past medical history of anxiety disorder was brought by her pet pit bull.  She had early cellulitis changes.  She was seen by hand surgeon.  She was hospitalized for further management.  Consultants: Dr. Mina MarbleWeingold with hand surgery  Procedures:  Incision and drainage, flexor sheath, left index finger and left hand dorsal wounds x4.   HOSPITAL COURSE:   Dog bite and early cellulitis of the left hand Patient was initially started on vancomycin and Unasyn.  Patient was seen by hand surgery.  Patient underwent incision and drainage on 12/26.  Patient was given tetanus toxoid in the emergency department.  Transitioned to Augmentin today.  Follow-up with hand surgery tomorrow in their office.  They will follow-up on cultures. Patient will be discharged with oral pain medications.  Seems to be stable this morning.    History of anxiety disorder Continue home medications.  [facility]  Hypokalemia Repleted.  Okay for discharge home.    PERTINENT LABS:  The results of significant diagnostics from this hospitalization (including imaging, microbiology, ancillary and laboratory) are listed below for reference.      Labs: Basic Metabolic Panel: Recent Labs  Lab 01/18/17 1600  01/19/17 0430  NA 136 134*  K 3.4* 3.9  CL 102 106  CO2 19* 22  GLUCOSE 105* 101*  BUN 12 15  CREATININE 0.74 0.78  CALCIUM 8.6* 8.5*   Liver Function Tests: Recent Labs  Lab 01/19/17 0430  AST 19  ALT 13*  ALKPHOS 56  BILITOT 1.4*  PROT 5.5*  ALBUMIN 3.1*   CBC: Recent Labs  Lab 01/18/17 1600 01/19/17 0430  WBC 10.9* 9.5  NEUTROABS 7.8*  --   HGB 14.8 13.7  HCT 42.9 40.5  MCV 95.5 95.5  PLT 264 233    IMAGING STUDIES Dg Hand Complete Left  Result Date: 01/18/2017 CLINICAL DATA:  Bitten by dog in left hand, at the left index finger and thumb. EXAM: LEFT HAND - COMPLETE 3+ VIEW COMPARISON:  None. FINDINGS: There is no evidence of fracture or dislocation. The joint spaces are preserved. The carpal rows are intact, and demonstrate normal alignment. Scattered soft tissue air is noted tracking about the second and third metacarpophalangeal joints. No radiopaque foreign bodies are seen. IMPRESSION: No evidence of fracture or dislocation. No radiopaque foreign bodies seen. Electronically Signed   By: Roanna RaiderJeffery  Chang M.D.   On: 01/18/2017 02:32    DISCHARGE EXAMINATION: Vitals:   01/19/17 1652 01/19/17 1705 01/19/17 2051 01/20/17 0525  BP:  (!) 139/96 110/66 110/65  Pulse: 71 72 77 64  Resp: 11 12 16 17   Temp: (!) 97 F (36.1 C) 97.9 F (36.6 C) 98.5 F (36.9 C) 98.2 F (36.8 C)  TempSrc:  Oral Oral   SpO2: 97% 96% 96% 94%  Weight:  Height:       General appearance: alert, cooperative, appears stated age and no distress Resp: clear to auscultation bilaterally Cardio: regular rate and rhythm, S1, S2 normal, no murmur, click, rub or gallop GI: soft, non-tender; bowel sounds normal; no masses,  no organomegaly Extremities: Left hand is in a dressing.  Able to move her fingers.  Good capillary refill.  DISPOSITION: Home  Discharge Instructions    Call MD for:  extreme fatigue   Complete by:  As directed    Call MD for:  persistant dizziness or  light-headedness   Complete by:  As directed    Call MD for:  persistant nausea and vomiting   Complete by:  As directed    Call MD for:  severe uncontrolled pain   Complete by:  As directed    Call MD for:  temperature >100.4   Complete by:  As directed    Discharge instructions   Complete by:  As directed    Please follow up with Dr. Mina MarbleWeingold as instructed.  You were cared for by a hospitalist during your hospital stay. If you have any questions about your discharge medications or the care you received while you were in the hospital after you are discharged, you can call the unit and asked to speak with the hospitalist on call if the hospitalist that took care of you is not available. Once you are discharged, your primary care physician will handle any further medical issues. Please note that NO REFILLS for any discharge medications will be authorized once you are discharged, as it is imperative that you return to your primary care physician (or establish a relationship with a primary care physician if you do not have one) for your aftercare needs so that they can reassess your need for medications and monitor your lab values. If you do not have a primary care physician, you can call [phone] for a physician referral.   Increase activity slowly   Complete by:  As directed         Allergies as of 01/20/2017      Reactions   Seroquel [quetiapine Fumarate] Other (See Comments)   restless leg   Dilaudid [hydromorphone Hcl] Rash   Rash and itching      Medication List    TAKE these medications   acetaminophen 325 MG tablet Commonly known as:  TYLENOL Take 650 mg by mouth every 6 (six) hours as needed for mild pain.   amoxicillin-clavulanate 875-125 MG tablet Commonly known as:  AUGMENTIN Take 1 tablet by mouth every 12 (twelve) hours for 7 days.   gabapentin 100 MG capsule Commonly known as:  NEURONTIN Take 100 mg by mouth 2 (two) times daily.   hydrOXYzine 25 MG  tablet Commonly known as:  ATARAX/VISTARIL Take 1 tablet (25 mg total) by mouth 3 (three) times daily as needed for anxiety (sleep).   ibuprofen 600 MG tablet Commonly known as:  ADVIL,MOTRIN Take 1 tablet (600 mg total) by mouth every 6 (six) hours as needed for moderate pain.   nicotine 14 mg/24hr patch Commonly known as:  NICODERM CQ - dosed in mg/24 hours Place 1 patch (14 mg total) onto the skin daily.   oxyCODONE 5 MG immediate release tablet Commonly known as:  Oxy IR/ROXICODONE Take 1-2 tablets (5-10 mg total) by mouth every 4 (four) hours as needed for severe pain.        Follow-up Information    Dairl PonderWeingold, Matthew, MD Follow up on 01/21/2017.  Specialty:  Orthopedic Surgery Why:  For packing removal and whirlpool Contact information: [address] [place] [address] [phone]           TOTAL DISCHARGE TIME: 35 mins  Osvaldo Shipper  [facility] Pager [phone]  01/20/2017, 3:01 PM

## 2017-01-20 NOTE — Discharge Instructions (Signed)
Animal Bite Animal bite wounds can get infected. It is important to get proper medical treatment. Ask your doctor if you need rabies treatment. Follow these instructions at home: Wound care  Follow instructions from your doctor about how to take care of your wound. Make sure you: ? Wash your hands with soap and water before you change your bandage (dressing). If you cannot use soap and water, use hand sanitizer. ? Change your bandage as told by your doctor. ? Leave stitches (sutures), skin glue, or skin tape (adhesive) strips in place. They may need to stay in place for 2 weeks or longer. If tape strips get loose and curl up, you may trim the loose edges. Do not remove tape strips completely unless your doctor says it is okay.  Check your wound every day for signs of infection. Watch for: ? Redness, swelling, or pain that gets worse. ? Fluid, blood, or pus. General instructions  Take or apply over-the-counter and prescription medicines only as told by your doctor.  If you were prescribed an antibiotic, take or apply it as told by your doctor. Do not stop using the antibiotic even if your condition improves.  Keep the injured area raised (elevated) above the level of your heart while you are sitting or lying down.  If directed, apply ice to the injured area. ? Put ice in a plastic bag. ? Place a towel between your skin and the bag. ? Leave the ice on for 20 minutes, 2-3 times per day.  Keep all follow-up visits as told by your doctor. This is important. Contact a doctor if:  You have redness, swelling, or pain that gets worse.  You have a general feeling of sickness (malaise).  You feel sick to your stomach (nauseous).  You throw up (vomit).  You have pain that does not get better. Get help right away if:  You have a red streak going away from your wound.  You have fluid, blood, or pus coming from your wound.  You have a fever or chills.  You have trouble moving your  injured area.  You have numbness or tingling anywhere on your body. This information is not intended to replace advice given to you by your health care provider. Make sure you discuss any questions you have with your health care provider. Document Released: 01/11/2005 Document Revised: 06/19/2015 Document Reviewed: 05/29/2014 Elsevier Interactive Patient Education  2018 [employer]  

## 2017-01-21 LAB — AEROBIC CULTURE W GRAM STAIN (SUPERFICIAL SPECIMEN): Gram Stain: NONE SEEN

## 2017-01-24 LAB — ANAEROBIC CULTURE

## 2017-12-24 ENCOUNTER — Emergency Department
Admission: EM | Admit: 2017-12-24 | Discharge: 2017-12-24 | Disposition: A | Discharge status: Home / Self Care | Payer: Self-pay | Attending: Obstetrics & Gynecology | Admitting: Obstetrics & Gynecology

## 2017-12-24 ENCOUNTER — Emergency Department: Payer: Self-pay

## 2017-12-24 ENCOUNTER — Other Ambulatory Visit: Payer: Self-pay

## 2017-12-24 DIAGNOSIS — Z3A00 Weeks of gestation of pregnancy not specified: Secondary | ICD-10-CM | POA: Insufficient documentation

## 2017-12-24 DIAGNOSIS — F17210 Nicotine dependence, cigarettes, uncomplicated: Secondary | ICD-10-CM | POA: Insufficient documentation

## 2017-12-24 DIAGNOSIS — Z3201 Encounter for pregnancy test, result positive: Secondary | ICD-10-CM | POA: Insufficient documentation

## 2017-12-24 DIAGNOSIS — O034 Incomplete spontaneous abortion without complication: Secondary | ICD-10-CM

## 2017-12-24 DIAGNOSIS — O039 Complete or unspecified spontaneous abortion without complication: Secondary | ICD-10-CM | POA: Insufficient documentation

## 2017-12-24 LAB — CBC WITH DIFFERENTIAL/PLATELET
Abs Immature Granulocytes: 0.06 10*3/uL (ref 0.00–0.07)
Basophils Absolute: 0.1 10*3/uL (ref 0.0–0.1)
Basophils Relative: 0 %
EOS PCT: 0 %
Eosinophils Absolute: 0 10*3/uL (ref 0.0–0.5)
HEMATOCRIT: 36.2 % (ref 36.0–46.0)
Hemoglobin: 12.1 g/dL (ref 12.0–15.0)
Immature Granulocytes: 0 %
LYMPHS ABS: 2 10*3/uL (ref 0.7–4.0)
LYMPHS PCT: 13 %
MCH: 32.3 pg (ref 26.0–34.0)
MCHC: 33.4 g/dL (ref 30.0–36.0)
MCV: 96.5 fL (ref 80.0–100.0)
MONO ABS: 0.8 10*3/uL (ref 0.1–1.0)
MONOS PCT: 6 %
Neutro Abs: 11.9 10*3/uL — ABNORMAL HIGH (ref 1.7–7.7)
Neutrophils Relative %: 81 %
Platelets: 230 10*3/uL (ref 150–400)
RBC: 3.75 MIL/uL — ABNORMAL LOW (ref 3.87–5.11)
RDW: 13.3 % (ref 11.5–15.5)
WBC: 14.8 10*3/uL — ABNORMAL HIGH (ref 4.0–10.5)
nRBC: 0 % (ref 0.0–0.2)

## 2017-12-24 LAB — BASIC METABOLIC PANEL
Anion gap: 11 (ref 5–15)
BUN: 16 mg/dL (ref 6–20)
CO2: 23 mmol/L (ref 22–32)
Calcium: 8.7 mg/dL — ABNORMAL LOW (ref 8.9–10.3)
Chloride: 101 mmol/L (ref 98–111)
Creatinine, Ser: 0.33 mg/dL — ABNORMAL LOW (ref 0.44–1.00)
GFR calc Af Amer: >60 mL/min (ref >60)
GFR calc non Af Amer: >60 mL/min (ref >60)
GLUCOSE: 95 mg/dL (ref 70–99)
Potassium: 4.2 mmol/L (ref 3.5–5.1)
SODIUM: 135 mmol/L (ref 135–145)

## 2017-12-24 LAB — TYPE AND SCREEN
ABO/RH(D): A POS
Antibody Screen: NEGATIVE

## 2017-12-24 LAB — HCG, QUANTITATIVE, PREGNANCY: hCG, Beta Chain, Quant, S: 116321 m[IU]/mL — ABNORMAL HIGH (ref <5)

## 2017-12-24 MED ORDER — METHYLERGONOVINE MALEATE 0.2 MG PO TABS
0.2000 mg | ORAL_TABLET | Freq: Four times a day (QID) | ORAL | Status: DC
  Administered 2017-12-24: 0.2 mg via ORAL
  Filled 2017-12-24 (×3): qty 1

## 2017-12-24 MED ORDER — MORPHINE SULFATE (PF) 4 MG/ML IV SOLN
INTRAVENOUS | Status: AC
  Administered 2017-12-24: 4 mg
  Filled 2017-12-24: qty 1

## 2017-12-24 MED ORDER — ONDANSETRON HCL 4 MG/2ML IJ SOLN
INTRAMUSCULAR | Status: AC
  Administered 2017-12-24: 4 mg
  Filled 2017-12-24: qty 2

## 2017-12-24 MED ORDER — SODIUM CHLORIDE 0.9 % IV BOLUS
1000.0000 mL | Freq: Once | INTRAVENOUS | Status: AC
  Administered 2017-12-24: 1000 mL via INTRAVENOUS

## 2017-12-24 MED ORDER — KETOROLAC TROMETHAMINE 10 MG PO TABS: 10.0000 mg | ORAL_TABLET | Freq: Three times a day (TID) | ORAL | 0 refills | Status: DC | PRN

## 2017-12-24 MED ORDER — KETOROLAC TROMETHAMINE 30 MG/ML IJ SOLN
30.0000 mg | Freq: Once | INTRAMUSCULAR | Status: AC
  Administered 2017-12-24: 30 mg via INTRAMUSCULAR

## 2017-12-24 MED ORDER — KETOROLAC TROMETHAMINE 30 MG/ML IJ SOLN
30.0000 mg | Freq: Once | INTRAMUSCULAR | Status: DC
  Filled 2017-12-24: qty 1

## 2017-12-24 MED ORDER — METHYLERGONOVINE MALEATE 0.2 MG PO TABS: ORAL_TABLET | ORAL | 0 refills | Status: AC

## 2017-12-24 NOTE — ED Notes (Addendum)
OB in room with pt. Pelvic exam. Another tissue sample sent to lab. Pt tolerated well.

## 2017-12-24 NOTE — ED Triage Notes (Signed)
Pt reports being on what she thought was her period since last wednesday. Pt thinks she had a period this time last month but reports that it was "lighter than normal". Pt did not know she was pregnant and has no idea how far along she is. Today she began having large clots and "fetal remains" per EMS and pt. EMS reports EBL of about 500ml. Pt reports cramping.

## 2017-12-24 NOTE — ED Notes (Signed)
Patient transported to Ultrasound 

## 2017-12-24 NOTE — ED Provider Notes (Signed)
US reviewed by Dr. Tiburcio PeaHarris who did then speak to the patient. Will plan on discharging home at this time with medication.   Kristen Schneider, Kristen Steffy, MD 12/24/17 [phone]

## 2017-12-24 NOTE — Discharge Instructions (Addendum)
Return to the emergency department immediately for any worsening condition including worsening or uncontrolled pain, dizziness or passing out, or severe bleeding such as saturating a pad an hour for several hours, or any other symptoms concerning to you.

## 2017-12-24 NOTE — H&P (Signed)
Obstetrics & Gynecology History and Physical Note  Date of Consultation: 12/24/2017   [facility]  Primary OBGYN: None Primary Care Provider: Patient, No Pcp Per  Reason for Consultation: Bleeding  History of Present Illness: Kristen Schneider is a 27 y.o. G2P0020 (No LMP recorded.), with the above CC. Thinks she had period one month ago but it was lighter than usual.  Today heavy bleeding and passage of tissue.  Pain in suprapubic region.  No back pain, trauma, fever, dysuria.  Thinks she had miscarriage in past.  Usually has reg cycles.  ROS: A review of systems was performed and was complete and comprehensive, except as stated in the above HPI.  OBGYN History: As per HPI. OB History    Gravida  2   Para  0   Term      Preterm      AB  2   Living        SAB  2   TAB      Ectopic      Multiple      Live Births           Obstetric Comments  1st Menstrual Cycle: 5614  miscarriage at age 27 and 6718.       Past Medical History: Past Medical History:  Diagnosis Date  . Abnormal menstrual periods   . Anxiety   . Polysubstance abuse (HCC)   . Shingles 2006  . Torn rotator cuff left shoulder   Past Surgical History: Past Surgical History:  Procedure Laterality Date  . APPENDECTOMY    . I&D EXTREMITY Left 01/19/2017   Procedure: IRRIGATION AND DEBRIDEMENT EXTREMITY;  Surgeon: Dairl PonderWeingold, Matthew, MD;  [facility];  Service: Orthopedics;  Laterality: Left;  . LAPAROSCOPIC APPENDECTOMY N/A 05/23/2014   Procedure: APPENDECTOMY LAPAROSCOPIC;  Surgeon: Harriette Bouillonhomas Cornett, MD;  [facility];  Service: General;  Laterality: N/A;  . LAPAROSCOPIC LYSIS OF ADHESIONS N/A 05/23/2014   Procedure: LAPAROSCOPIC LYSIS OF ADHESIONS;  Surgeon: Harriette Bouillonhomas Cornett, MD;  Location: [facility] OR;  Service: General;  Laterality: N/A;  . POLYPECTOMY     As a child multiple colonoscopy's for polyp removal  . TONSILLECTOMY     27 y/o   . WISDOM TOOTH EXTRACTION     27 y/o     Family History:  Family History  Problem Relation Age of Onset  . Alcoholism Father   . Colon cancer Neg Hx    She denies any female cancers, bleeding or blood clotting disorders.   Social History:  Social History   Socioeconomic History  . Marital status: Single    Spouse name: Not on file  . Number of children: Not on file  . Years of education: Not on file  . Highest education level: Not on file  Occupational History  . Not on file  Social Needs  . Financial resource strain: Not on file  . Food insecurity:    Worry: Not on file    Inability: Not on file  . Transportation needs:    Medical: Not on file    Non-medical: Not on file  Tobacco Use  . Smoking status: Current Every Day Smoker    Packs/day: 1.00    Types: Cigarettes  . Smokeless tobacco: Former Engineer, waterUser  Substance and Sexual Activity  . Alcohol use: Yes    Alcohol/week: 0.0 standard drinks  . Drug use: No    Types: Cocaine, Marijuana    Comment: no cocaine for 2-3 months-- not using  marijuana  . Sexual activity: Yes    Birth control/protection: Pill  Lifestyle  . Physical activity:    Days per week: Not on file    Minutes per session: Not on file  . Stress: Not on file  Relationships  . Social connections:    Talks on phone: Not on file    Gets together: Not on file    Attends religious service: Not on file    Active member of club or organization: Not on file    Attends meetings of clubs or organizations: Not on file    Relationship status: Not on file  . Intimate partner violence:    Fear of current or ex partner: Not on file    Emotionally abused: Not on file    Physically abused: Not on file    Forced sexual activity: Not on file  Other Topics Concern  . Not on file  Social History Narrative  . Not on file    Allergy: Allergies  Allergen Reactions  . Seroquel [Quetiapine Fumarate] Other (See Comments)    restless leg  . Dilaudid [Hydromorphone Hcl] Rash    Rash and itching     Current Outpatient Medications:  (Not in a hospital admission)  Hospital Medications: Current Facility-Administered Medications  Medication Dose Route Frequency Provider Last Rate Last Dose  . methylergonovine (METHERGINE) tablet 0.2 mg  0.2 mg Oral QID Nadara Mustard, MD       Current Outpatient Medications  Medication Sig Dispense Refill  . acetaminophen (TYLENOL) 325 MG tablet Take 650 mg by mouth every 6 (six) hours as needed for mild pain.    Marland Kitchen gabapentin (NEURONTIN) 100 MG capsule Take 100 mg by mouth 2 (two) times daily.    . hydrOXYzine (ATARAX/VISTARIL) 25 MG tablet Take 1 tablet (25 mg total) by mouth 3 (three) times daily as needed for anxiety (sleep). 30 tablet 0  . ibuprofen (ADVIL,MOTRIN) 600 MG tablet Take 1 tablet (600 mg total) by mouth every 6 (six) hours as needed for moderate pain. 30 tablet 0  . nicotine (NICODERM CQ - DOSED IN MG/24 HOURS) 14 mg/24hr patch Place 1 patch (14 mg total) onto the skin daily. 28 patch 0  . oxyCODONE (OXY IR/ROXICODONE) 5 MG immediate release tablet Take 1-2 tablets (5-10 mg total) by mouth every 4 (four) hours as needed for severe pain. 20 tablet 0    Physical Exam: Vitals:   12/24/17 1249 12/24/17 1250 12/24/17 1300 12/24/17 1330  BP: 131/78  135/79 122/71  Pulse: 95  95 78  Resp: 16  (!) 21 19  Temp: 98.6 F (37 C)     TempSrc: Oral     SpO2: 96%  96% 97%  Weight:  72.6 kg    Height:  5\' 5"  (1.651 m)      Temp:  [98.6 F (37 C)] 98.6 F (37 C) (11/30 1249) Pulse Rate:  [78-95] 78 (11/30 1330) Resp:  [16-21] 19 (11/30 1330) BP: (122-135)/(71-79) 122/71 (11/30 1330) SpO2:  [96 %-97 %] 97 % (11/30 1330) Weight:  [72.6 kg] 72.6 kg (11/30 1250) No intake/output data recorded. No intake/output data recorded. No intake or output data in the 24 hours ending 12/24/17 1414  Body mass index is 26.63 kg/m. Constitutional: Well nourished, well developed female in no acute distress.  HEENT: normal Neck:  Supple, normal  appearance, and no thyromegaly  Cardiovascular:Regular rate and rhythm.  No murmurs, rubs or gallops. Respiratory:  Clear to auscultation bilateral. Normal respiratory effort  Abdomen: positive bowel sounds and no masses, hernias; diffusely non tender to palpation, non distended Neuro: grossly intact Psych:  Normal mood and affect.  Skin:  Warm and dry.  MS: normal gait and normal bilateral lower extremity strength/ROM/symmetry Lymphatic:  No inguinal lymphadenopathy.   Pelvic exam: is limited by body habitus EGBUS: within normal limits Vagina: within normal limits. Bladder and Urethra: normal. Cervix: bleeding but not dilated Uterus:  enlarged, 8 weeks size Adnexa: normal adnexa  Laboratory: Beta HCG: 116,000 Recent Labs  Lab 12/24/17 1253  WBC 14.8*  HGB 12.1  HCT 36.2  PLT 230   Recent Labs  Lab 12/24/17 1253  NA 135  K 4.2  CL 101  CO2 23  BUN 16  CREATININE 0.33*  CALCIUM 8.7*  GLUCOSE 95   Recent Labs  Lab 12/24/17 1255  ABORH A POS   Imaging:  Ultrasound independently reviewed/interpreted by self.  Assessment: Ms. Starr is a 27 y.o. G2P0020 w uncertain LMP but likely 2-3 months pregnant when she came in w heavy bleeding and passage of fetal-like tissue so findings are consistent with miscarriage, complete vs incomplete.  Plan: [place] Methergine Fluids Monitoring for incomplete, and thus D&C.  Counseled as to pros and cons of surgery.  Annamarie Major, MD, [facility], [facility] 12/24/2017  2:14 PM Pager [phone]

## 2017-12-24 NOTE — ED Notes (Signed)
MD notified that pt would like something for anxiety.

## 2017-12-24 NOTE — ED Provider Notes (Signed)
[facility] Emergency Department Provider Note ____________________________________________   I have reviewed the triage vital signs and the triage nursing note.  HISTORY  Chief Complaint Miscarriage   Historian Patient  HPI Kristen Schneider is a 27 y.o. female presents by EMS for heavy vaginal bleeding, asked to fetus.  Patient did not know she was pregnant.  States that she had a relatively light menstrual.  About 1 month ago and 1 month before that was normal.  She does not typically have irregular menses.  She started having vaginal bleeding on Wednesday about 4 days ago now.  Fairly typical to her regular.  This morning she had heavy cramping and then passed large clots and fetus.  She is currently still having vaginal bleeding.  She has 7 out of 10 vaginal cramping.  She has never been pregnant before.  She thinks she is B+ blood type   Past Medical History:  Diagnosis Date  . Abnormal menstrual periods   . Anxiety   . Polysubstance abuse (HCC)   . Shingles 2006  . Torn rotator cuff left shoulder    Patient Active Problem List   Diagnosis Date Noted  . Dog bite, hand, left, initial encounter 01/18/2017  . Cellulitis of left hand 01/18/2017  . Anxiety disorder 01/18/2017  . Left breast abscess 08/31/2016  . New onset seizure (HCC) 08/15/2015  . Hypokalemia   . Polysubstance abuse (HCC)   . 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 Vitamin B12 deficiency 04/25/2015  . Alcohol use disorder, severe, dependence (HCC) 04/23/2015  . Cannabis use disorder, moderate, dependence (HCC) 04/23/2015  . Cocaine use disorder, moderate, dependence (HCC) 04/23/2015  . Opioid use disorder, moderate, dependence (HCC) 04/23/2015  . 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 MDD (major depressive disorder) 04/22/2015  . Tobacco use disorder 05/30/2014    Past Surgical History:   Procedure Laterality Date  . APPENDECTOMY    . I&D EXTREMITY Left 01/19/2017   Procedure: IRRIGATION AND DEBRIDEMENT EXTREMITY;  Surgeon: Dairl PonderWeingold, Matthew, MD;  [facility];  Service: Orthopedics;  Laterality: Left;  . LAPAROSCOPIC APPENDECTOMY N/A 05/23/2014   Procedure: APPENDECTOMY LAPAROSCOPIC;  Surgeon: Harriette Bouillonhomas Cornett, MD;  [facility];  Service: General;  Laterality: N/A;  . LAPAROSCOPIC LYSIS OF ADHESIONS N/A 05/23/2014   Procedure: LAPAROSCOPIC LYSIS OF ADHESIONS;  Surgeon: Harriette Bouillonhomas Cornett, MD;  Location: [facility] OR;  Service: General;  Laterality: N/A;  . POLYPECTOMY     As a child multiple colonoscopy's for polyp removal  . TONSILLECTOMY     27 y/o   . WISDOM TOOTH EXTRACTION     27 y/o     Prior to Admission medications   Medication Sig Start Date End Date Taking? Authorizing Provider  acetaminophen (TYLENOL) 325 MG tablet Take 650 mg by mouth every 6 (six) hours as needed for mild pain.    [provider]  gabapentin (NEURONTIN) 100 MG capsule Take 100 mg by mouth 2 (two) times daily.    [provider]  hydrOXYzine (ATARAX/VISTARIL) 25 MG tablet Take 1 tablet (25 mg total) by mouth 3 (three) times daily as needed for anxiety (sleep). 04/25/15   Jimmy FootmanHernandez-Gonzalez, Andrea, MD  ibuprofen (ADVIL,MOTRIN) 600 MG tablet Take 1 tablet (600 mg total) by mouth every 6 (six) hours as needed for moderate pain. 01/20/17   Osvaldo ShipperKrishnan, Gokul, MD  methylergonovine (METHERGINE) 0.2 MG tablet One tab by mouth every 6 hours for 3 more doses  12/24/17   Governor Rooks, MD  nicotine (NICODERM CQ - DOSED IN MG/24 HOURS) 14 mg/24hr patch Place 1 patch (14 mg total) onto the skin daily. 01/20/17   Osvaldo Shipper, MD  oxyCODONE (OXY IR/ROXICODONE) 5 MG immediate release tablet Take 1-2 tablets (5-10 mg total) by mouth every 4 (four) hours as needed for severe pain. 01/20/17   Osvaldo Shipper, MD    Allergies  Allergen Reactions  . Seroquel [Quetiapine Fumarate] Other (See Comments)     restless leg  . Dilaudid [Hydromorphone Hcl] Rash    Rash and itching    Family History  Problem Relation Age of Onset  . Alcoholism Father   . Colon cancer Neg Hx     Social History Social History   Tobacco Use  . Smoking status: Current Every Day Smoker    Packs/day: 1.00    Types: Cigarettes  . Smokeless tobacco: Former Engineer, water Use Topics  . Alcohol use: Yes    Alcohol/week: 0.0 standard drinks  . Drug use: No    Types: Cocaine, Marijuana    Comment: no cocaine for 2-3 months-- not using marijuana    Review of Systems  Constitutional: Negative for fever. Eyes: Negative for visual changes. ENT: Negative for sore throat. Cardiovascular: Negative for chest pain. Respiratory: Negative for shortness of breath. Gastrointestinal: Negative for abdominal pain, vomiting and diarrhea. Genitourinary: Negative for dysuria.  Positive for vaginal bleeding and miscarriage as per HPI. Musculoskeletal: Negative for extremity pain. Skin: Negative for rash. Neurological: Negative for headache.  ____________________________________________   PHYSICAL EXAM:  VITAL SIGNS: ED Triage Vitals  Enc Vitals Group     BP 12/24/17 1249 131/78     Pulse Rate 12/24/17 1249 95     Resp 12/24/17 1249 16     Temp 12/24/17 1249 98.6 F (37 C)     Temp Source 12/24/17 1249 Oral     SpO2 12/24/17 1249 96 %     Weight 12/24/17 1250 160 lb (72.6 kg)     Height 12/24/17 1250 5\' 5"  (1.651 m)     Head Circumference --      Peak Flow --      Pain Score 12/24/17 1250 8     Pain Loc --      Pain Edu? --      Excl. in GC? --      Constitutional: Alert and oriented.  HEENT      Head: Normocephalic and atraumatic.      Eyes: Conjunctivae are normal. Pupils equal and round.       Ears:         Nose: No congestion/rhinnorhea.      Mouth/Throat: Mucous membranes are moist.      Neck: No stridor. Cardiovascular/Chest: Normal rate, regular rhythm.  No murmurs, rubs, or  gallops. Respiratory: Normal respiratory effort without tachypnea nor retractions. Breath sounds are clear and equal bilaterally. No wheezes/rales/rhonchi. Gastrointestinal: Soft. No distention, no guarding, no rebound.  Tenderness suprapubically. Genitourinary/rectal: External exam large amount of bloody clots.  Tiny fetus, approximately 4 cm brought in a bag. Musculoskeletal: Nontender with normal range of motion in all extremities. No joint effusions.  No lower extremity tenderness.  No edema. Neurologic:  Normal speech and language. No gross or focal neurologic deficits are appreciated. Skin:  Skin is warm, dry and intact. No rash noted. Psychiatric: Mood and affect are normal. Speech and behavior are normal. Patient exhibits appropriate insight and judgment.   ____________________________________________  LABS (pertinent  positives/negatives) I, Governor Rooks, MD the attending physician have reviewed the labs noted below.  Labs Reviewed  HCG, QUANTITATIVE, PREGNANCY - Abnormal; Notable for the following components:      Result Value   hCG, Beta Chain, Mahalia Longest 161,096 (*)    All other components within normal limits  BASIC METABOLIC PANEL - Abnormal; Notable for the following components:   Creatinine, Ser 0.33 (*)    Calcium 8.7 (*)    All other components within normal limits  CBC WITH DIFFERENTIAL/PLATELET - Abnormal; Notable for the following components:   WBC 14.8 (*)    RBC 3.75 (*)    Neutro Abs 11.9 (*)    All other components within normal limits  TYPE AND SCREEN  SURGICAL PATHOLOGY    ____________________________________________    EKG I, Governor Rooks, MD, the attending physician have personally viewed and interpreted all ECGs.  None ____________________________________________  RADIOLOGY   US pelvic: Pending __________________________________________  PROCEDURES  Procedure(s) performed: None  Procedures  Critical Care performed:  None   ____________________________________________  ED COURSE / ASSESSMENT AND PLAN  Pertinent labs & imaging results that were available during my care of the patient were reviewed by me and considered in my medical decision making (see chart for details).    With stable vital signs, with active/incomplete miscarriage.  She did come in having passed small fetus.  She was unaware that she was pregnant, this being also her first pregnancy.  No tachycardia or hypotension, however she is having pretty significant vaginal bleeding.  Discussed with OB/GYN Dr. Bonney Aid.  She is receiving IV fluid bolus.  Dr. Tiburcio Pea evaluated in the ED, cervix closed.  He recommends Methergine 0.2 mg every 6 hours x 4 doses, first dose here in the ER.  I have written a prescription for that.  He will evaluate pelvic ultrasound to determine whether or not patient needs surgical D&C.  ED patient care transferred to Dr. Derrill Kay at shift change 3 PM.  If plan is for disposition home, patient may be discharged with my prepared discharge instructions and prescription.  CONSULTATIONS:   OB GYN, Dr. Tiburcio Pea.   Patient / Family / Caregiver informed of clinical course, medical decision-making process, and agree with plan.   I discussed return precautions, follow-up instructions, and discharge instructions with patient and/or family.  Discharge Instructions : Return to the emergency department immediately for any worsening condition including worsening or uncontrolled pain, dizziness or passing out, or severe bleeding such as saturating a pad an hour for several hours, or any other symptoms concerning to you.    ___________________________________________   FINAL CLINICAL IMPRESSION(S) / ED DIAGNOSES   Final diagnoses:  Incomplete miscarriage      ___________________________________________         Note: This dictation was prepared with Dragon dictation. Any transcriptional errors that result  from this process are unintentional    Governor Rooks, MD 12/24/17 1457

## 2017-12-27 LAB — SURGICAL PATHOLOGY

## 2018-04-12 ENCOUNTER — Encounter (HOSPITAL_COMMUNITY): Payer: Self-pay | Admitting: *Deleted

## 2018-04-12 ENCOUNTER — Other Ambulatory Visit: Payer: Self-pay

## 2018-04-12 ENCOUNTER — Emergency Department (HOSPITAL_COMMUNITY)
Admission: EM | Admit: 2018-04-12 | Discharge: 2018-04-12 | Disposition: A | Discharge status: Home / Self Care | Payer: Medicaid Other | Attending: Emergency Medicine | Admitting: Emergency Medicine

## 2018-04-12 DIAGNOSIS — F319 Bipolar disorder, unspecified: Secondary | ICD-10-CM | POA: Insufficient documentation

## 2018-04-12 DIAGNOSIS — R45851 Suicidal ideations: Secondary | ICD-10-CM | POA: Insufficient documentation

## 2018-04-12 DIAGNOSIS — F17210 Nicotine dependence, cigarettes, uncomplicated: Secondary | ICD-10-CM | POA: Insufficient documentation

## 2018-04-12 DIAGNOSIS — F1020 Alcohol dependence, uncomplicated: Secondary | ICD-10-CM | POA: Insufficient documentation

## 2018-04-12 LAB — CBC WITH DIFFERENTIAL/PLATELET
Abs Immature Granulocytes: 0.05 10*3/uL (ref 0.00–0.07)
Basophils Absolute: 0.1 10*3/uL (ref 0.0–0.1)
Basophils Relative: 1 %
EOS PCT: 0 %
Eosinophils Absolute: 0 10*3/uL (ref 0.0–0.5)
HCT: 46.1 % — ABNORMAL HIGH (ref 36.0–46.0)
Hemoglobin: 14.6 g/dL (ref 12.0–15.0)
Immature Granulocytes: 0 %
Lymphocytes Relative: 19 %
Lymphs Abs: 2.5 10*3/uL (ref 0.7–4.0)
MCH: 28.2 pg (ref 26.0–34.0)
MCHC: 31.7 g/dL (ref 30.0–36.0)
MCV: 89.2 fL (ref 80.0–100.0)
MONO ABS: 0.8 10*3/uL (ref 0.1–1.0)
Monocytes Relative: 6 %
Neutro Abs: 9.6 10*3/uL — ABNORMAL HIGH (ref 1.7–7.7)
Neutrophils Relative %: 74 %
Platelets: 353 10*3/uL (ref 150–400)
RBC: 5.17 MIL/uL — ABNORMAL HIGH (ref 3.87–5.11)
RDW: 16.1 % — AB (ref 11.5–15.5)
WBC: 13 10*3/uL — ABNORMAL HIGH (ref 4.0–10.5)
nRBC: 0 % (ref 0.0–0.2)

## 2018-04-12 LAB — COMPREHENSIVE METABOLIC PANEL
ALT: 24 U/L (ref 0–44)
AST: 21 U/L (ref 15–41)
Albumin: 4.6 g/dL (ref 3.5–5.0)
Alkaline Phosphatase: 77 U/L (ref 38–126)
Anion gap: 12 (ref 5–15)
BILIRUBIN TOTAL: 0.8 mg/dL (ref 0.3–1.2)
BUN: 8 mg/dL (ref 6–20)
CO2: 20 mmol/L — ABNORMAL LOW (ref 22–32)
Calcium: 9.3 mg/dL (ref 8.9–10.3)
Chloride: 106 mmol/L (ref 98–111)
Creatinine, Ser: 0.62 mg/dL (ref 0.44–1.00)
GFR calc Af Amer: >60 mL/min (ref >60)
Glucose, Bld: 115 mg/dL — ABNORMAL HIGH (ref 70–99)
Potassium: 4.1 mmol/L (ref 3.5–5.1)
Sodium: 138 mmol/L (ref 135–145)
TOTAL PROTEIN: 8.3 g/dL — AB (ref 6.5–8.1)

## 2018-04-12 LAB — ETHANOL: Alcohol, Ethyl (B): 221 mg/dL — ABNORMAL HIGH (ref <10)

## 2018-04-12 LAB — I-STAT BETA HCG BLOOD, ED (MC, WL, AP ONLY): I-stat hCG, quantitative: <5 m[IU]/mL (ref <5)

## 2018-04-12 NOTE — ED Notes (Signed)
Dinner tray has been ordered for regular diet

## 2018-04-12 NOTE — ED Triage Notes (Signed)
During triage Pt teaerful and crying. Pt rapid redialing same number in room phone . Phone removed from room. Pt continued to cry and reported it was her right to make phone call. Pt reminded she had made multiple calls.

## 2018-04-12 NOTE — ED Notes (Signed)
All belongings given back to pt. Valuables returned to pt from security.  Pt to call a cab to go to boyfriends house. Spoke with MD and pt is good to be dc'd. Pt given dc instructions and states understanding.

## 2018-04-12 NOTE — ED Triage Notes (Signed)
[employer]  At door with PT.

## 2018-04-12 NOTE — Discharge Instructions (Signed)
We saw you in the ER for your mental health concerns and had our behavioral health team evaluate you. The team feels comfortable sending you home, please follow the recommendations given to you by them and the follow-up and medications they have prescribed to you. Please refrain from substance abuse. Return to the ER if your symptoms worsen.  
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
## 2018-04-12 NOTE — ED Notes (Addendum)
[employer] released to go. Pt calm and cooperative at time.

## 2018-04-12 NOTE — ED Notes (Signed)
Pt wanded by security. 
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
## 2018-04-12 NOTE — ED Triage Notes (Signed)
Pt IVC before arrival to ed.  Pt told Friend she wanted to Die. Pt attempted to jump out of car on the way to the hospital.Pt is reported to have daily ETOH intake. Pt A/O x 4  And ambulatory on arrival to ED.
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
## 2018-04-12 NOTE — ED Notes (Signed)
Pt making 5 min phone call  

## 2018-07-30 ENCOUNTER — Emergency Department (HOSPITAL_COMMUNITY): Payer: Self-pay

## 2018-07-30 ENCOUNTER — Emergency Department (HOSPITAL_COMMUNITY)
Admission: EM | Admit: 2018-07-30 | Discharge: 2018-07-30 | Disposition: A | Discharge status: Home / Self Care | Payer: Self-pay | Attending: Emergency Medicine | Admitting: Emergency Medicine

## 2018-07-30 ENCOUNTER — Encounter (HOSPITAL_COMMUNITY): Payer: Self-pay | Admitting: Emergency Medicine

## 2018-07-30 DIAGNOSIS — Y22XXXA Handgun discharge, undetermined intent, initial encounter: Secondary | ICD-10-CM | POA: Insufficient documentation

## 2018-07-30 DIAGNOSIS — S41102A Unspecified open wound of left upper arm, initial encounter: Secondary | ICD-10-CM | POA: Insufficient documentation

## 2018-07-30 DIAGNOSIS — Y929 Unspecified place or not applicable: Secondary | ICD-10-CM | POA: Insufficient documentation

## 2018-07-30 DIAGNOSIS — Y939 Activity, unspecified: Secondary | ICD-10-CM | POA: Insufficient documentation

## 2018-07-30 DIAGNOSIS — S20312A Abrasion of left front wall of thorax, initial encounter: Secondary | ICD-10-CM | POA: Insufficient documentation

## 2018-07-30 DIAGNOSIS — Y281XXA Contact with knife, undetermined intent, initial encounter: Secondary | ICD-10-CM | POA: Insufficient documentation

## 2018-07-30 DIAGNOSIS — W3400XA Accidental discharge from unspecified firearms or gun, initial encounter: Secondary | ICD-10-CM

## 2018-07-30 MED ORDER — MORPHINE SULFATE (PF) 4 MG/ML IV SOLN
4.0000 mg | Freq: Once | INTRAVENOUS | Status: AC
  Administered 2018-07-30: 01:00:00 4 mg via INTRAVENOUS
  Filled 2018-07-30: qty 1

## 2018-07-30 MED ORDER — MORPHINE SULFATE (PF) 4 MG/ML IV SOLN
4.0000 mg | Freq: Once | INTRAVENOUS | Status: AC
  Administered 2018-07-30: 4 mg via INTRAVENOUS
  Filled 2018-07-30: qty 1

## 2018-07-30 MED ORDER — CEPHALEXIN 500 MG PO CAPS: 500.0000 mg | ORAL_CAPSULE | Freq: Four times a day (QID) | ORAL | 0 refills | Status: AC

## 2018-07-30 MED ORDER — OXYCODONE-ACETAMINOPHEN 5-325 MG PO TABS: 1.0000 | ORAL_TABLET | Freq: Four times a day (QID) | ORAL | 0 refills | Status: AC | PRN

## 2018-07-30 MED ORDER — ONDANSETRON HCL 4 MG/2ML IJ SOLN
4.0000 mg | Freq: Once | INTRAMUSCULAR | Status: AC
  Administered 2018-07-30: 4 mg via INTRAVENOUS
  Filled 2018-07-30: qty 2

## 2018-07-30 NOTE — Discharge Instructions (Addendum)
Begin taking Keflex as prescribed.  Local wound care with bacitracin and dressing changes twice daily.  Percocet as prescribed as needed for pain.  Return to the emergency department if you develop redness surrounding the wound, pus draining from the wound, fevers, or other new and concerning symptoms.
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
## 2018-07-30 NOTE — ED Notes (Signed)
Patient verbalizes understanding of discharge instructions. Opportunity for questioning and answers were provided. Armband removed by staff, pt discharged from ED.  

## 2018-07-31 ENCOUNTER — Encounter (HOSPITAL_COMMUNITY): Payer: Self-pay | Admitting: *Deleted

## 2019-06-29 ENCOUNTER — Emergency Department (HOSPITAL_COMMUNITY)
Admission: EM | Admit: 2019-06-29 | Discharge: 2019-06-29 | Disposition: A | Discharge status: Home / Self Care | Payer: Self-pay | Attending: Emergency Medicine | Admitting: Emergency Medicine

## 2019-06-29 ENCOUNTER — Other Ambulatory Visit: Payer: Self-pay

## 2019-06-29 DIAGNOSIS — Y908 Blood alcohol level of 240 mg/100 ml or more: Secondary | ICD-10-CM | POA: Insufficient documentation

## 2019-06-29 DIAGNOSIS — R45851 Suicidal ideations: Secondary | ICD-10-CM | POA: Insufficient documentation

## 2019-06-29 DIAGNOSIS — F1010 Alcohol abuse, uncomplicated: Secondary | ICD-10-CM | POA: Insufficient documentation

## 2019-06-29 DIAGNOSIS — Z5321 Procedure and treatment not carried out due to patient leaving prior to being seen by health care provider: Secondary | ICD-10-CM | POA: Insufficient documentation

## 2019-06-29 LAB — COMPREHENSIVE METABOLIC PANEL
ALT: 15 U/L (ref 0–44)
AST: 18 U/L (ref 15–41)
Albumin: 4.1 g/dL (ref 3.5–5.0)
Alkaline Phosphatase: 64 U/L (ref 38–126)
Anion gap: 13 (ref 5–15)
BUN: 14 mg/dL (ref 6–20)
CO2: 22 mmol/L (ref 22–32)
Calcium: 8.2 mg/dL — ABNORMAL LOW (ref 8.9–10.3)
Chloride: 107 mmol/L (ref 98–111)
Creatinine, Ser: 0.51 mg/dL (ref 0.44–1.00)
GFR calc Af Amer: >60 mL/min (ref >60)
GFR calc non Af Amer: >60 mL/min (ref >60)
Glucose, Bld: 109 mg/dL — ABNORMAL HIGH (ref 70–99)
Potassium: 3.6 mmol/L (ref 3.5–5.1)
Sodium: 142 mmol/L (ref 135–145)
Total Bilirubin: 0.5 mg/dL (ref 0.3–1.2)
Total Protein: 7.7 g/dL (ref 6.5–8.1)

## 2019-06-29 LAB — CBC
HCT: 38.8 % (ref 36.0–46.0)
Hemoglobin: 12.4 g/dL (ref 12.0–15.0)
MCH: 31 pg (ref 26.0–34.0)
MCHC: 32 g/dL (ref 30.0–36.0)
MCV: 97 fL (ref 80.0–100.0)
Platelets: 324 10*3/uL (ref 150–400)
RBC: 4 MIL/uL (ref 3.87–5.11)
RDW: 14.3 % (ref 11.5–15.5)
WBC: 7.7 10*3/uL (ref 4.0–10.5)
nRBC: 0 % (ref 0.0–0.2)

## 2019-06-29 LAB — ETHANOL: Alcohol, Ethyl (B): 258 mg/dL — ABNORMAL HIGH (ref <10)
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
